# Patient Record
Sex: Female | Born: 1940 | Race: White | Hispanic: No | Smoking: Never smoker
Health system: Southern US, Community
[De-identification: ages and names within clinical notes are randomized; demographics above are authoritative.]

## PROBLEM LIST (undated history)

## (undated) DIAGNOSIS — E669 Obesity, unspecified: Secondary | ICD-10-CM

## (undated) DIAGNOSIS — E785 Hyperlipidemia, unspecified: Secondary | ICD-10-CM

## (undated) DIAGNOSIS — F329 Major depressive disorder, single episode, unspecified: Secondary | ICD-10-CM

## (undated) DIAGNOSIS — H9190 Unspecified hearing loss, unspecified ear: Secondary | ICD-10-CM

## (undated) DIAGNOSIS — F419 Anxiety disorder, unspecified: Secondary | ICD-10-CM

## (undated) DIAGNOSIS — E039 Hypothyroidism, unspecified: Secondary | ICD-10-CM

## (undated) DIAGNOSIS — H43399 Other vitreous opacities, unspecified eye: Secondary | ICD-10-CM

## (undated) DIAGNOSIS — F32A Depression, unspecified: Secondary | ICD-10-CM

## (undated) DIAGNOSIS — I10 Essential (primary) hypertension: Secondary | ICD-10-CM

## (undated) HISTORY — DX: Unspecified hearing loss, unspecified ear: H91.90

## (undated) HISTORY — PX: TONSILLECTOMY: SUR1361

## (undated) HISTORY — DX: Major depressive disorder, single episode, unspecified: F32.9

## (undated) HISTORY — PX: EYE SURGERY: SHX253

## (undated) HISTORY — PX: ANKLE SURGERY: SHX546

## (undated) HISTORY — DX: Essential (primary) hypertension: I10

## (undated) HISTORY — DX: Obesity, unspecified: E66.9

## (undated) HISTORY — DX: Depression, unspecified: F32.A

## (undated) HISTORY — DX: Hyperlipidemia, unspecified: E78.5

## (undated) HISTORY — PX: COLONOSCOPY: SHX174

## (undated) HISTORY — DX: Other vitreous opacities, unspecified eye: H43.399

## (undated) HISTORY — PX: ABDOMINAL HYSTERECTOMY: SHX81

## (undated) HISTORY — DX: Hypothyroidism, unspecified: E03.9

---

## 2015-07-20 ENCOUNTER — Encounter: Payer: Self-pay | Admitting: Sports Medicine

## 2015-07-20 ENCOUNTER — Ambulatory Visit
Admission: RE | Admit: 2015-07-20 | Discharge: 2015-07-20 | Disposition: A | Discharge status: Home / Self Care | Payer: Medicare HMO | Source: Ambulatory Visit | Attending: Sports Medicine | Admitting: Sports Medicine

## 2015-07-20 ENCOUNTER — Ambulatory Visit (INDEPENDENT_AMBULATORY_CARE_PROVIDER_SITE_OTHER): Payer: Medicare HMO | Admitting: Sports Medicine

## 2015-07-20 VITALS — BP 160/80 | Ht 68.0 in | Wt 225.0 lb

## 2015-07-20 DIAGNOSIS — M542 Cervicalgia: Secondary | ICD-10-CM

## 2015-07-20 IMAGING — CR DG CERVICAL SPINE COMPLETE 4+V
7 series · 7 of 7 positions shown · non-contrast
Comparison: None.

CLINICAL DATA: Neck pain.  No recent injury

EXAM:
CERVICAL SPINE - COMPLETE 4+ VIEW

[view not recorded (1 of 7)]
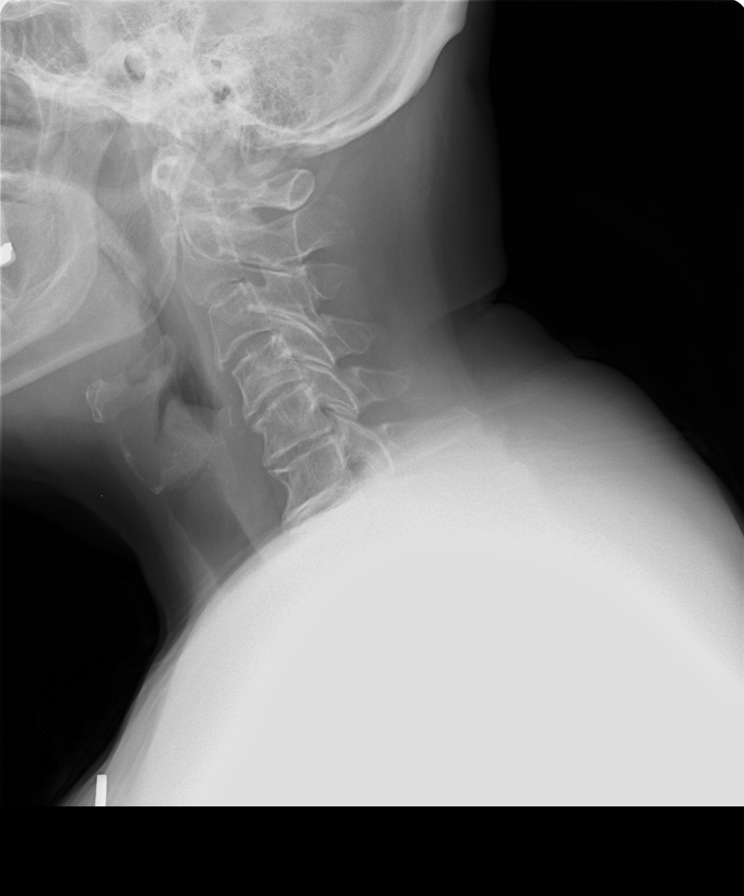

[view not recorded (2 of 7)]
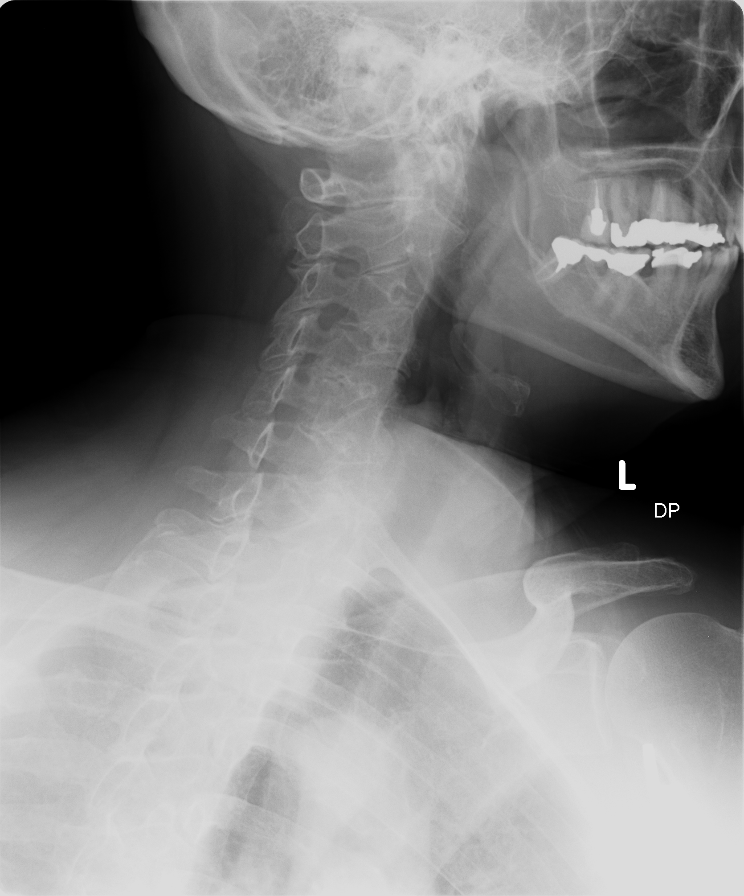

[view not recorded (3 of 7)]
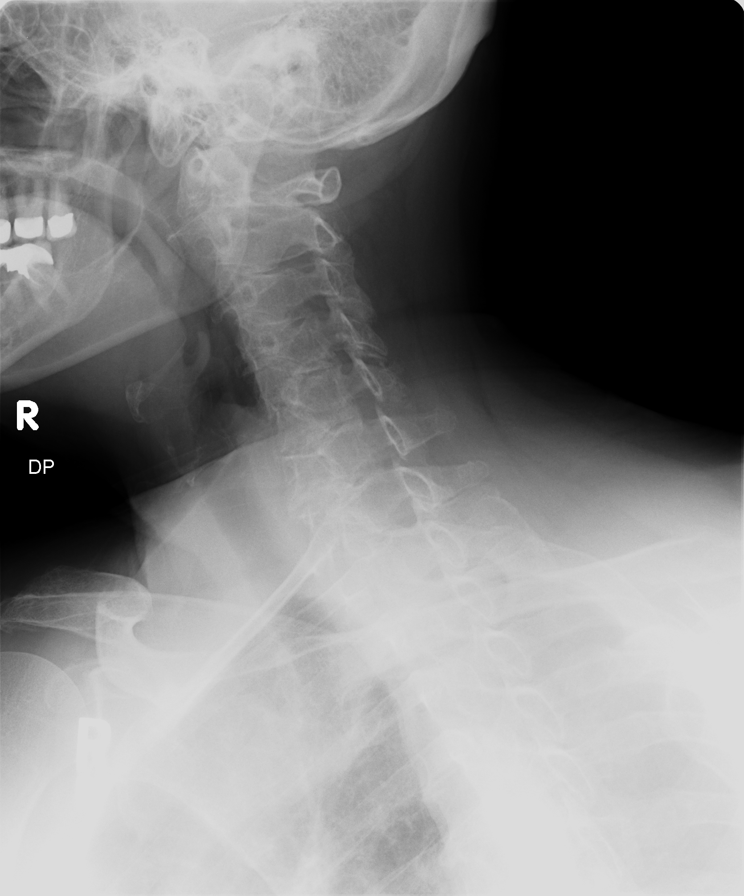

[view not recorded (4 of 7)]
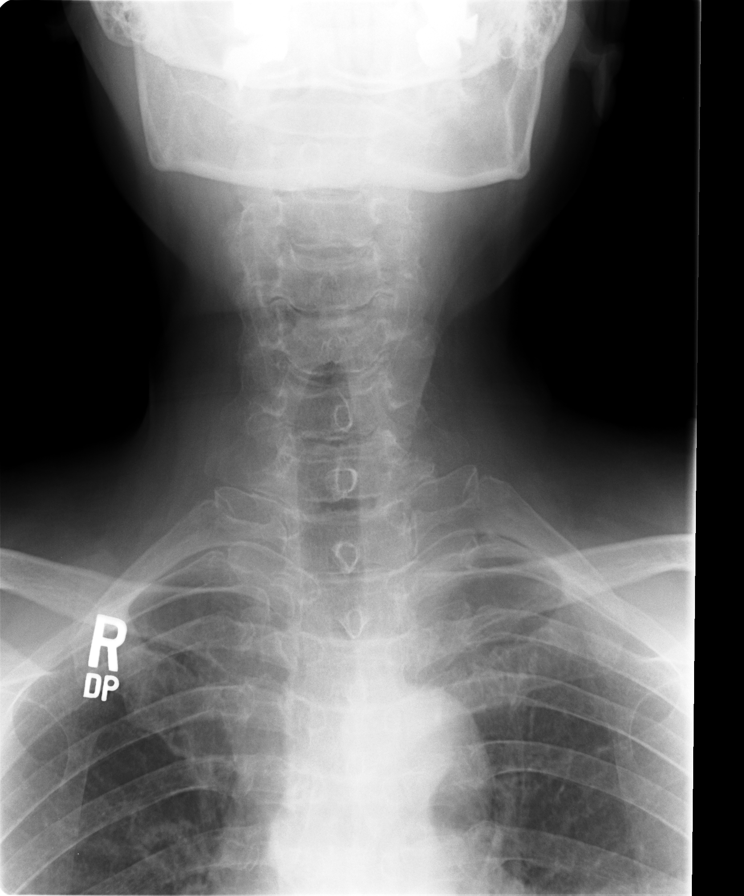

[view not recorded (5 of 7)]
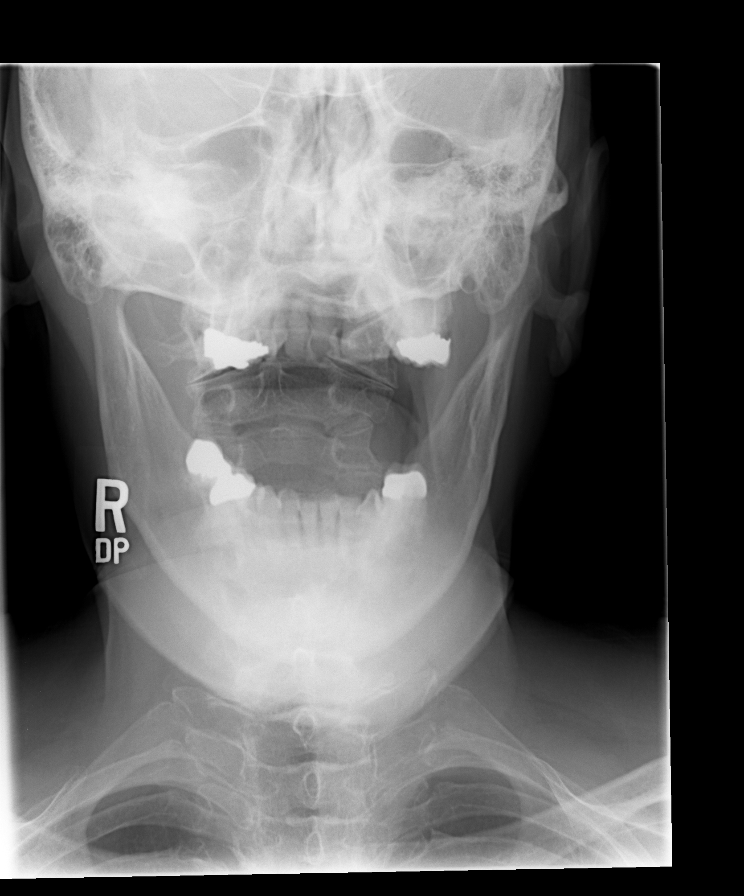

[view not recorded (6 of 7)]
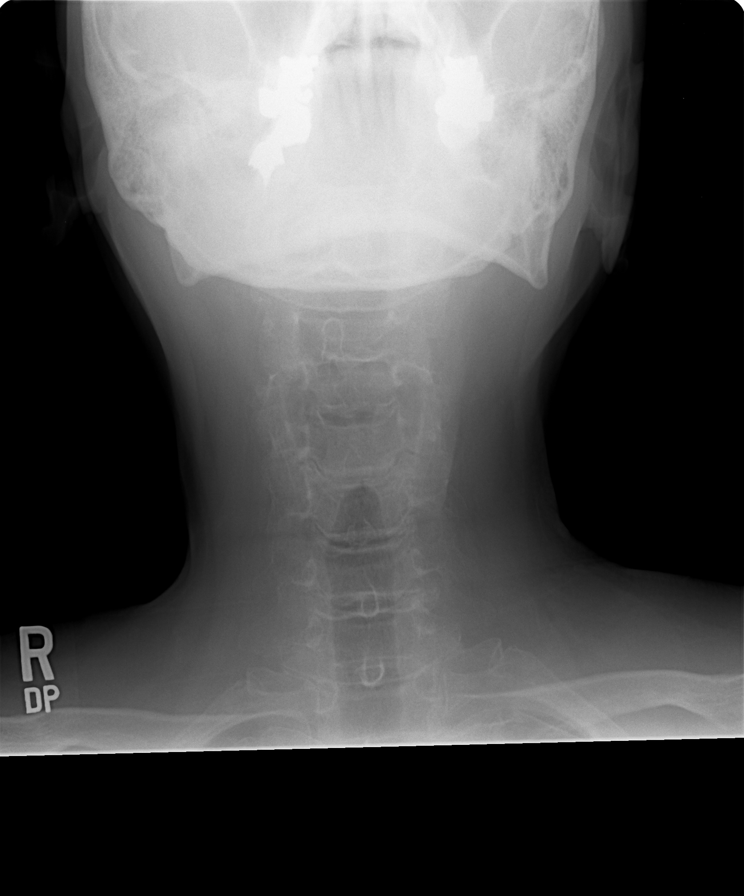

[view not recorded (7 of 7)]
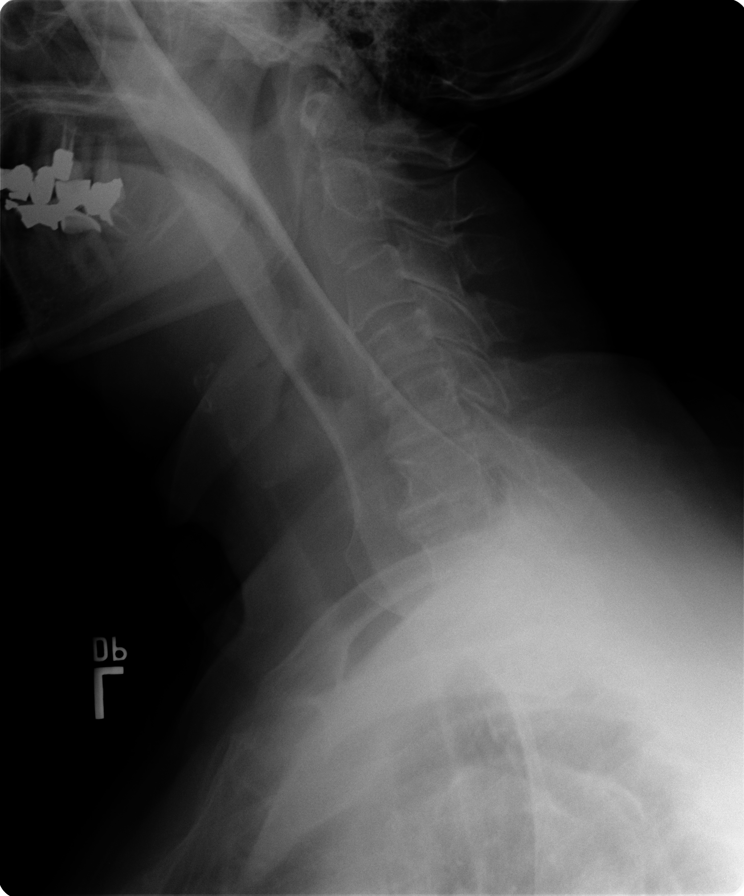

[7 of 7 positions shown; findings below may reference images not displayed]

FINDINGS: Straightening of the cervical lordosis. Normal alignment. Negative
for fracture or mass.

Disc degeneration and spondylosis at C4-5 and C5-6. Mild to moderate
foraminal narrowing bilaterally C4-5 due to uncinate spurring.
Remaining neural foramina are adequately patent.
IMPRESSION: Disc degeneration and spondylosis C4-5 with mild to moderate
foraminal narrowing bilaterally.

No acute bony abnormality.

## 2015-07-20 MED ORDER — TRAMADOL HCL 50 MG PO TABS: 50.0000 mg | ORAL_TABLET | Freq: Four times a day (QID) | ORAL | Status: DC | PRN

## 2015-07-20 MED ORDER — PREDNISONE 10 MG PO TABS: ORAL_TABLET | ORAL | Status: DC

## 2015-07-20 NOTE — Progress Notes (Signed)
  Meghan Diaz - 75 y.o. female MRN [id number]  Date of birth: [birth year]  CC: Left sided neck pain  SUBJECTIVE:   HPI She is Meghan Diaz is a very pleasant 75 year old female with recently poorly controlled hypertension and hypothyroidism who presents with about 2 weeks of acute onset left-sided neck pain. She does not have any history of neck pain previously. Initially she presented to outside facility and was prescribed Flexeril 5 mg did not help her pain whatsoever. She was then seen at a [facility] urgent care 8 days ago and prescribed Voltaren 75 mg twice a day and methocarbamol 500 mg 4 times a day both of which she has been taking scheduled. Unfortunately she has not had a significant improvement in her symptoms. She describes the pain as an intense migrating and throbbing pain of her left trap. There is no pain or other abnormality radiating past the acromion. She denies any weakness in her upper extremities. She denies any trauma or known cause of this discomfort. Currently the pain is severe. She denies any fevers chills or night sweats. She denies any bowel or bladder dysfunction. Notably at the first urgent care her blood pressure was 200/100 but came down to an unknown value prior to being released. She does not believe she has had a x-rays thus far. She does not believe she has any issues with her blood sugar.   ROS:     10 point RoS negative other than that listed in HPI above.   HISTORY: Past Medical, Surgical, Social, and Family History Reviewed & Updated per EMR.  Pertinent Historical Findings include: HTN, recently elevated; Hypothyroid  OBJECTIVE: BP 160/80 mmHg  Ht 5\' 8"  (1.727 m)  Wt 225 lb (102.059 kg)  BMI 34.22 kg/m2  Physical Exam  Long, no acute distress Nonlabored breathing  Neck:  TTP throughout left trap.  Non tender over the spinous processes Negative spurling's Limited Range of motion in all direction, specficially with flexion/extension Grip strength and  sensation normal in bilateral hands Strength good C4 to T1 distribution No sensory change to C4 to T1 Reflexes normal  MEDICATIONS, LABS & OTHER ORDERS: Previous Medications   DICLOFENAC (VOLTAREN) 75 MG EC TABLET       LEVOTHYROXINE (SYNTHROID, LEVOTHROID) 75 MCG TABLET       LOSARTAN-HYDROCHLOROTHIAZIDE (HYZAAR) 50-12.5 MG TABLET       METHOCARBAMOL (ROBAXIN) 500 MG TABLET       SODIUM CHLORIDE (OCEAN) 0.65 % NASAL SPRAY       Modified Medications   No medications on file   New Prescriptions   TRAMADOL (ULTRAM) 50 MG TABLET    Take 1 tablet (50 mg total) by mouth every 6 (six) hours as needed.   Discontinued Medications   No medications on file  No orders of the defined types were placed in this encounter.   ASSESSMENT & PLAN: See problem based charting & AVS for pt instructions.

## 2015-07-20 NOTE — Assessment & Plan Note (Signed)
[name] is a very pleasant 75 year old female with acute onset left neck pain is consistent with axial neck pain. She has no neurologic symptoms. Her pain has been refractory to one week of scheduled methocarbamol and diclofenac. We will discontinue these medications and place her on a prednisone Dosepak, 6 day. Additionally will provide her with Ultram 50 mg q6-8 hours as needed for pain. Additionally we will evaluate the neck with 4 view x-ray of the cervical spine. We plan to see her back in 5 days. Should her pain worsen or she were to develop new symptoms she should contact us or seek other medical attention. Call with any questions in the interim.

## 2015-07-21 ENCOUNTER — Telehealth: Payer: Self-pay | Admitting: Sports Medicine

## 2015-07-21 NOTE — Telephone Encounter (Signed)
Opened by mistake.

## 2015-07-26 ENCOUNTER — Ambulatory Visit (INDEPENDENT_AMBULATORY_CARE_PROVIDER_SITE_OTHER): Payer: Medicare HMO | Admitting: Sports Medicine

## 2015-07-26 ENCOUNTER — Encounter: Payer: Self-pay | Admitting: Sports Medicine

## 2015-07-26 VITALS — BP 150/84 | Ht 68.0 in | Wt 225.0 lb

## 2015-07-26 DIAGNOSIS — M5030 Other cervical disc degeneration, unspecified cervical region: Secondary | ICD-10-CM | POA: Diagnosis not present

## 2015-07-26 DIAGNOSIS — M542 Cervicalgia: Secondary | ICD-10-CM

## 2015-07-26 NOTE — Progress Notes (Signed)
   Subjective:    Patient ID: Meghan Diaz, female    DOB: Feb 04, 1941, 75 y.o.   MRN: [id number]  HPI   Patient comes in today for follow-up on neck pain. Pain has improved with a 6 day Sterapred Dosepak. She has been unable to take tramadol due to side effects. She still has some limited range of motion and some occasional spasms but overall her symptoms have improved since her office visit a few days ago. She has been using a heating pad which she has found to be helpful.    Review of Systems     Objective:   Physical Exam Well-developed, well-nourished. No acute distress. Sitting comfortably in the exam room.  Cervical spine: Patient still has limited cervical range of motion in all planes. Some slight tenderness to palpation along the left trapezius. No gross focal neurological deficit of either upper extremity.  X-rays of her cervical spine dated 07/20/2015 are reviewed. There is disc degeneration and spondylosis at C4-C5 with mild to moderate foraminal narrowing bilaterally. Nothing acute is seen.       Assessment & Plan:  Improving neck pain secondary to C4-C5 degenerative disc disease   Patient will start 600-800 mg of Advil every 8 hours as needed. She may resume her methocarbamol for spasm as well. Continue with her heating pad. I've arranged [facility]. If symptoms persist or worsen I would consider further diagnostic imaging in the form of an MRI. Patient may continue to resume activity as tolerated and follow-up with me as needed.

## 2015-08-03 ENCOUNTER — Encounter: Payer: Self-pay | Admitting: Rehabilitation

## 2015-08-03 ENCOUNTER — Ambulatory Visit: Payer: Medicare HMO | Attending: Sports Medicine | Admitting: Rehabilitation

## 2015-08-03 DIAGNOSIS — M542 Cervicalgia: Secondary | ICD-10-CM | POA: Diagnosis not present

## 2015-08-03 NOTE — Therapy (Signed)
[facility] [place] [address] [place], [place], [address] Phone: [phone]   Fax:  [phone]  Physical Therapy Evaluation  Patient Details  Name: Meghan Diaz MRN: [id number] Date of Birth: 05/15/1941 Referring Provider: draper  Encounter Date: 08/03/2015      PT End of Session - 08/03/15 1007    Visit Number 1   Date for PT Re-Evaluation 10/01/15   PT Start Time 0800   PT Stop Time 0900   PT Time Calculation (min) 60 min      History reviewed. No pertinent past medical history.  History reviewed. No pertinent past surgical history.  There were no vitals filed for this visit.  Visit Diagnosis:  Cervicalgia - Plan: PT plan of care cert/re-cert      Subjective Assessment - 08/03/15 0808    Subjective 75 y/o female referred to PT from ortho for neck pain.   Xrays demonstrates C4-C6 DDD with some unicinate spurring.     She reports the pain began suddenly and insidiously day after Christmas and became progressively more severe.  She went ortho and started prednisone which has helped her.    She initially could not move her neck much at all.   she denies any injury.   She is still limited with neck movement though, and is hvaing difficulty with all ADL.Marland Kitchen    She is an avid exercises and does machine weight training at [employer], but has been unable to do this since neck pain onset.  She is also unable to lie flat without severe pain and has to prop her head up significantly to sleep.    She has no previous h/o neck pain.     Pertinent History Htn, hypothyroidism   Limitations Lifting;Sitting;Other (comment)   Patient Stated Goals be painfree, be able to return to exercise program, sleep   Currently in Pain? Yes   Pain Score 5    Pain Location Neck   Pain Orientation Left   Pain Descriptors / Indicators Burning;Aching   Pain Type Acute pain   Pain Radiating Towards Top of L shoulder   Pain Onset 1 to 4 weeks  ago   Pain Frequency Constant   Aggravating Factors  lifting weights at gym, doing regular housework   Pain Relieving Factors pain meds   Effect of Pain on Daily Activities limited with household ADL due to the pain            [facility] PT Assessment - 08/03/15 0001    Assessment   Medical Diagnosis L neck pain   Referring Provider draper   Onset Date/Surgical Date 07/04/15   Prior Therapy no   Precautions   Precautions None   Restrictions   Weight Bearing Restrictions No   Balance Screen   Has the patient fallen in the past 6 months No   Has the patient had a decrease in activity level because of a fear of falling?  Yes   Is the patient reluctant to leave their home because of a fear of falling?  No   Home [employer]ist information centre manager residence   Prior Function   Level of Independence Independent   AROM   Cervical Flexion 100% no pain   Cervical Extension 10% lots of pain   Cervical - Right Side Bend 20% pain   Cervical - Left Side Bend 20% pain   Cervical - Right Rotation 30% pain end range   Cervical -  Left Rotation 20% more pain   Flexibility   Soft Tissue Assessment /Muscle Length yes   Palpation   Palpation comment + L UT trigger pt.   tender throughout paracervicals   Special Tests    Special Tests Cervical   Spurling's   Findings Positive   Side Left   Distraction Test   Findngs Negative   Comment BLE                   [facility] Adult PT Treatment/Exercise - 08/03/15 0001    Modalities   Modalities Electrical Stimulation;Moist Heat;Traction   Moist Heat Therapy   Number Minutes Moist Heat 15 Minutes   Moist Heat Location Cervical   Electrical Stimulation   Electrical Stimulation Location cervical L   Electrical Stimulation Parameters premod x 2 channel to L neck   Electrical Stimulation Goals Pain   Traction   Type of Traction Cervical   Min (lbs) 10   Hold Time static   Time 10                PT Education - 08/03/15  1006    Education provided Yes   Education Details seated cervical protraction and sidebending stretches holding underside of table and leaning away   Person(s) Educated Patient   Methods Explanation;Demonstration   Comprehension Need further instruction          PT Short Term Goals - 08/03/15 1034    PT SHORT TERM GOAL #1   Title Pt. will demo 50% cervical ROM in all planes   Time 4   Period Weeks   Status New   PT SHORT TERM GOAL #2   Title Pt. will report 2/10 pain cervical spine worst   Time 4   Period Weeks   Status New   PT SHORT TERM GOAL #3   Title Pt. will be independent with HEP   Time 4   Period Weeks   Status New           PT Long Term Goals - 08/03/15 1035    PT LONG TERM GOAL #1   Title Pt. will demonstrate 75% cervical ROM all planes   Time 8   Period Weeks   Status New   PT LONG TERM GOAL #2   Title Pt. will report 0/10 cervical spine pain and be able to return to weight lifting, gym exercise and all ADL with minimal to no pain or discomfort   Time 8   Period Weeks   Status New               Plan - 08/03/15 1008    Clinical Impression Statement Pt. with severe forward head/rounded shoulders.    Severe cervical ROM deficits and stifness.    Palpable cervical musculature tenderness and UT trigger pts.    + compression test on L   Pt will benefit from skilled therapeutic intervention in order to improve on the following deficits Decreased range of motion;Increased muscle spasms;Hypomobility;Postural dysfunction;Pain   Rehab Potential Good   PT Frequency 2x / week   PT Duration 8 weeks   PT Treatment/Interventions Traction;Ultrasound;Electrical Stimulation;Moist Heat;Therapeutic exercise;Manual techniques;Patient/family education   PT Next Visit Plan reassess if traction helped.    do some manual sidebending stretching, UT and suboccipital  MFR and ? light jt mobs, advance HEP         Problem List Patient Active Problem List    Diagnosis Date Noted  . Neck pain on left  side 07/20/2015    Meghan Diaz, PT 08/03/2015, 10:58 AM  [facility] [place] [address] [place], [place], [address] Phone: [phone]   Fax:  [phone]  Name: Meghan Diaz MRN: [id number] Date of Birth: Jul 11, 1940

## 2015-08-05 ENCOUNTER — Encounter: Payer: Self-pay | Admitting: Physical Therapy

## 2015-08-05 ENCOUNTER — Ambulatory Visit: Payer: Medicare HMO | Admitting: Physical Therapy

## 2015-08-05 DIAGNOSIS — M542 Cervicalgia: Secondary | ICD-10-CM | POA: Diagnosis not present

## 2015-08-05 NOTE — Therapy (Signed)
[facility] [place] [address] [place], [place], [address] Phone: [phone]   Fax:  [phone]  Physical Therapy Treatment  Patient Details  Name: Meghan Diaz MRN: [id number] Date of Birth: 12/25/1940 Referring Provider: draper  Encounter Date: 08/05/2015      PT End of Session - 08/05/15 1036    Visit Number 2   Date for PT Re-Evaluation 10/01/15   PT Start Time 1012   PT Stop Time 1100   PT Time Calculation (min) 48 min      History reviewed. No pertinent past medical history.  History reviewed. No pertinent past surgical history.  There were no vitals filed for this visit.  Visit Diagnosis:  Cervicalgia      Subjective Assessment - 08/05/15 1011    Subjective very sore and painful after last session- maybe from trying to stretch at home   Currently in Pain? Yes   Pain Score 5    Pain Location Neck                         [facility] Adult PT Treatment/Exercise - 08/05/15 0001    Exercises   Exercises Neck;Shoulder   Neck Exercises: Machines for Strengthening   UBE (Upper Arm Bike) 2 fwd/2 back   Neck Exercises: Standing   Other Standing Exercises 3# shruggs,backward rolls and scap squeeze 10 reps each   Other Standing Exercises yellow tband ext,retratcion and abd 10 times each   Neck Exercises: Supine   Cervical Isometrics Extension;Right lateral flexion;Left lateral flexion;Right rotation;Left rotation  on ball   Moist Heat Therapy   Number Minutes Moist Heat 15 Minutes   Moist Heat Location Cervical   Electrical Stimulation   Electrical Stimulation Location cervical    Electrical Stimulation Parameters IFC   Electrical Stimulation Goals Pain   Manual Therapy   Manual Therapy Soft tissue mobilization;Passive ROM;Manual Traction   Manual therapy comments very tender and limited tolerance   Passive ROM cerv spine   Manual Traction limited tolerance                PT  Education - 08/05/15 1015    Education provided Yes   Education Details reviewed HEP issued at eval and VCing for correct tech as pt was pulling too hard with UE during lat flexion   Person(s) Educated Patient   Methods Explanation;Verbal cues;Demonstration   Comprehension Verbalized understanding;Returned demonstration          PT Short Term Goals - 08/05/15 1039    PT SHORT TERM GOAL #3   Title Pt. will be independent with HEP   Status Achieved           PT Long Term Goals - 08/03/15 1035    PT LONG TERM GOAL #1   Title Pt. will demonstrate 75% cervical ROM all planes   Time 8   Period Weeks   Status New   PT LONG TERM GOAL #2   Title Pt. will report 0/10 cervical spine pain and be able to return to weight lifting, gym exercise and all ADL with minimal to no pain or discomfort   Time 8   Period Weeks   Status New               Plan - 08/05/15 1037    Clinical Impression Statement fwd head and rounded shld,worked on postural ther ex. Very limited tolerance to MT d/t pain and tenderness.  Corrected HEP and encouraged increased ROM. Difficulty lying d/t LBP   PT Next Visit Plan ROM, MT and postural ther ex        Problem List Patient Active Problem List   Diagnosis Date Noted  . Neck pain on left side 07/20/2015    PAYSEUR,ANGIE PTA 08/05/2015, 10:41 AM  [facility] [place] [address] [place], [place], [address] Phone: [phone]   Fax:  [phone]  Name: KISHIA SHACKETT MRN: [id number] Date of Birth: December 18, 1940

## 2015-08-09 ENCOUNTER — Ambulatory Visit: Payer: Medicare HMO | Admitting: Physical Therapy

## 2015-08-09 ENCOUNTER — Encounter: Payer: Self-pay | Admitting: Physical Therapy

## 2015-08-09 DIAGNOSIS — M542 Cervicalgia: Secondary | ICD-10-CM | POA: Diagnosis not present

## 2015-08-09 NOTE — Therapy (Signed)
[facility] [place] [address] [place], [place], [address] Phone: [phone]   Fax:  [phone]  Physical Therapy Treatment  Patient Details  Name: Meghan Diaz MRN: [id number] Date of Birth: [birth year] Referring Provider: draper  Encounter Date: 08/09/2015      PT End of Session - 08/09/15 0935    Visit Number 3   Date for PT Re-Evaluation 10/01/15   PT Start Time 0847   PT Stop Time 0945   PT Time Calculation (min) 58 min      History reviewed. No pertinent past medical history.  History reviewed. No pertinent past surgical history.  There were no vitals filed for this visit.  Visit Diagnosis:  Cervicalgia      Subjective Assessment - 08/09/15 0849    Subjective aleve every 8 hours,sleeping better. Pt still very quarded with all mvmt.    Currently in Pain? Yes   Pain Score 5    Pain Location Neck                         [facility] Adult PT Treatment/Exercise - 08/09/15 0001    Neck Exercises: Machines for Strengthening   UBE (Upper Arm Bike) 2 fwd/2 back   Neck Exercises: Standing   Other Standing Exercises 4# shruggs,backward rolls and scap squeeze 10 reps each   Other Standing Exercises yellow tband ext,retratcion and abd 10 times each   Neck Exercises: Seated   Neck Retraction 10 reps;3 secs  ball on wall   Modalities   Modalities Ultrasound   Moist Heat Therapy   Number Minutes Moist Heat 15 Minutes   Moist Heat Location Cervical   Electrical Stimulation   Electrical Stimulation Location cerv Left side   Electrical Stimulation Parameters IFC   Electrical Stimulation Goals Pain   Ultrasound   Ultrasound Location left cerv/UT/rhom   Ultrasound Parameters como with estim   Manual Therapy   Manual Therapy Soft tissue mobilization;Passive ROM   Manual therapy comments very tender and limited tolerance                  PT Short Term Goals - 08/05/15 1039    PT SHORT  TERM GOAL #3   Title Pt. will be independent with HEP   Status Achieved           PT Long Term Goals - 08/03/15 1035    PT LONG TERM GOAL #1   Title Pt. will demonstrate 75% cervical ROM all planes   Time 8   Period Weeks   Status New   PT LONG TERM GOAL #2   Title Pt. will report 0/10 cervical spine pain and be able to return to weight lifting, gym exercise and all ADL with minimal to no pain or discomfort   Time 8   Period Weeks   Status New               Plan - 08/09/15 0936    Clinical Impression Statement pt very guarded cerv ROM.very tender with STW and PROM. pt with relief with modalities. Continue wo work to increase ROM   PT Next Visit Plan ROM, MT and postural ther ex        Problem List Patient Active Problem List   Diagnosis Date Noted  . Neck pain on left side 07/20/2015    Cariana Karge,ANGIE PTA 08/09/2015, 9:37 AM  [facility] [address] [place], [place], [address] Phone: [phone]   Fax:  [phone]  Name: Meghan Diaz MRN: [id number] Date of Birth: [birth year]

## 2015-08-12 ENCOUNTER — Ambulatory Visit: Payer: Medicare HMO | Attending: Sports Medicine | Admitting: Physical Therapy

## 2015-08-12 ENCOUNTER — Encounter: Payer: Self-pay | Admitting: Physical Therapy

## 2015-08-12 DIAGNOSIS — M542 Cervicalgia: Secondary | ICD-10-CM

## 2015-08-12 NOTE — Therapy (Signed)
[facility] [place] [address] [place], [place], [address] Phone: [phone]   Fax:  [phone]  Physical Therapy Treatment  Patient Details  Name: Meghan Diaz MRN: [id number] Date of Birth: 1941-04-04 Referring Provider: draper  Encounter Date: 08/12/2015      PT End of Session - 08/12/15 0924    Visit Number 4   Date for PT Re-Evaluation 10/01/15   PT Start Time 0845   PT Stop Time 0943   PT Time Calculation (min) 58 min      History reviewed. No pertinent past medical history.  History reviewed. No pertinent past surgical history.  There were no vitals filed for this visit.  Visit Diagnosis:  Cervicalgia      Subjective Assessment - 08/12/15 0846    Subjective i have been really working on turning-better- i am pleased   Currently in Pain? Yes   Pain Score 3    Pain Location Neck   Pain Orientation Left            [facility] PT Assessment - 08/12/15 0001    AROM   Cervical Flexion 75% limited   Cervical Extension 75% limited   Cervical - Right Side Bend 75% limited   Cervical - Left Side Bend 75% limited   Cervical - Right Rotation 40% limited   Cervical - Left Rotation 40% limited                     [facility] Adult PT Treatment/Exercise - 08/12/15 0001    Neck Exercises: Machines for Strengthening   UBE (Upper Arm Bike) 2 fwd/2 back L2   Other Machines for Strengthening lat pull and seated row 15# 2 sets 10   Neck Exercises: Standing   Neck Retraction 15 reps;3 secs  head on ball on wall   Other Standing Exercises 2# standing ( neck retracted) flex,abd and chest press 2 sets 10   Shoulder Exercises: Pulleys   Other Pulley Exercises 5# 3 way 10 times   Moist Heat Therapy   Number Minutes Moist Heat 15 Minutes   Moist Heat Location Cervical   Electrical Stimulation   Electrical Stimulation Location cerv   Electrical Stimulation Parameters IFC   Electrical Stimulation Goals Pain    Manual Therapy   Manual Therapy Soft tissue mobilization;Passive ROM  Mulligan SNAGS and NAGS to increase cerv ROM   Manual therapy comments --  tender but increased tolerance to MT                  PT Short Term Goals - 08/12/15 0924    PT SHORT TERM GOAL #1   Title Pt. will demo 50% cervical ROM in all planes   Status On-going   PT SHORT TERM GOAL #2   Title Pt. will report 2/10 pain cervical spine worst   Status On-going   PT SHORT TERM GOAL #3   Title Pt. will be independent with HEP   Status On-going           PT Long Term Goals - 08/03/15 1035    PT LONG TERM GOAL #1   Title Pt. will demonstrate 75% cervical ROM all planes   Time 8   Period Weeks   Status New   PT LONG TERM GOAL #2   Title Pt. will report 0/10 cervical spine pain and be able to return to weight lifting, gym exercise and all ADL with minimal to no pain  or discomfort   Time 8   Period Weeks   Status New               Plan - 08/12/15 4098    Clinical Impression Statement ROM improving and increased tolerance to MT and ROM. Still swollen and tender in cerv spine with inflammation noted. Increased tolerance to ther ex   PT Next Visit Plan ROM, MT and postural ther ex        Problem List Patient Active Problem List   Diagnosis Date Noted  . Neck pain on left side 07/20/2015    Emin Foree,ANGIE PTA 08/12/2015, 9:26 AM  [facility] [place] [address] [place], [place], [address] Phone: [phone]   Fax:  [phone]  Name: MALINA GEERS MRN: [id number] Date of Birth: 1940/07/16

## 2015-08-16 ENCOUNTER — Ambulatory Visit: Payer: Medicare HMO | Admitting: Physical Therapy

## 2015-08-16 ENCOUNTER — Encounter: Payer: Self-pay | Admitting: Physical Therapy

## 2015-08-16 DIAGNOSIS — M542 Cervicalgia: Secondary | ICD-10-CM

## 2015-08-16 NOTE — Therapy (Signed)
[facility] [place] [facility] [address] [place], [place], [address] Phone: [phone]   Fax:  [phone]  Physical Therapy Treatment  Patient Details  Name: KHUSHBU PIPPEN MRN: [id number] Date of Birth: 1940-08-17 Referring Provider: draper  Encounter Date: 08/16/2015      PT End of Session - 08/16/15 0926    Visit Number 5   Date for PT Re-Evaluation 10/01/15   PT Start Time 0833   PT Stop Time 0936   PT Time Calculation (min) 63 min   Activity Tolerance Patient tolerated treatment well   Behavior During Therapy [facility] for tasks assessed/performed      History reviewed. No pertinent past medical history.  History reviewed. No pertinent past surgical history.  There were no vitals filed for this visit.  Visit Diagnosis:  Cervicalgia      Subjective Assessment - 08/16/15 0833    Subjective Pt states she was very sore after last visit but felt better the next day.   Currently in Pain? Yes   Pain Score 3    Pain Location Neck   Pain Orientation Left                         [facility] Adult PT Treatment/Exercise - 08/16/15 0001    Neck Exercises: Machines for Strengthening   UBE (Upper Arm Bike) 2 fwd/2 back L2   Other Machines for Strengthening lat pull and seated row 15# 2 sets 10   Neck Exercises: Standing   Neck Retraction 15 reps;3 secs  head on ball on wall   Other Standing Exercises 4# shoulder shruggs 2x15, backward and foward shoulder rolls 4# 2x10, scap squeeze x 10   Shoulder Exercises: Seated   Other Seated Exercises seated bent over row and extension 2x10 3#   Shoulder Exercises: Standing   Other Standing Exercises postural reset against wall, external rotation with red tband with back flat against wall x 15, abduction with back flat against wall x10   Modalities   Modalities Electrical Stimulation;Moist Heat   Moist Heat Therapy   Number Minutes Moist Heat 15 Minutes   Moist Heat Location  Cervical   Electrical Stimulation   Electrical Stimulation Location cervical   Electrical Stimulation Action IFC   Electrical Stimulation Parameters tolerance   Electrical Stimulation Goals Pain   Manual Therapy   Manual Therapy Soft tissue mobilization;Passive ROM  mulligan NAGS and SNAGS                  PT Short Term Goals - 08/12/15 0924    PT SHORT TERM GOAL #1   Title Pt. will demo 50% cervical ROM in all planes   Status On-going   PT SHORT TERM GOAL #2   Title Pt. will report 2/10 pain cervical spine worst   Status On-going   PT SHORT TERM GOAL #3   Title Pt. will be independent with HEP   Status On-going           PT Long Term Goals - 08/03/15 1035    PT LONG TERM GOAL #1   Title Pt. will demonstrate 75% cervical ROM all planes   Time 8   Period Weeks   Status New   PT LONG TERM GOAL #2   Title Pt. will report 0/10 cervical spine pain and be able to return to weight lifting, gym exercise and all ADL with minimal to no pain or discomfort  Time 8   Period Weeks   Status New               Plan - 08/16/15 1610    Clinical Impression Statement Pt tolerated exercises well today. Added some postural exercises that she had some difficulty with due to lack of ROM. Pt still had tightness and tenderness in left cervical spine.   Pt will benefit from skilled therapeutic intervention in order to improve on the following deficits Decreased range of motion;Increased muscle spasms;Hypomobility;Postural dysfunction;Pain   Rehab Potential Good   PT Frequency 2x / week   PT Duration 8 weeks   PT Treatment/Interventions Traction;Ultrasound;Electrical Stimulation;Moist Heat;Therapeutic exercise;Manual techniques;Patient/family education   PT Next Visit Plan ROM, MT, progress postural exercises   Consulted and Agree with Plan of Care Patient        Problem List Patient Active Problem List   Diagnosis Date Noted  . Neck pain on left side 07/20/2015     Vicie Mutters, SPT 08/16/2015, 9:30 AM  [facility] [address] [place], [place], [address] Phone: [phone]   Fax:  [phone]  Name: SANJA ELIZARDO MRN: [id number] Date of Birth: 17-May-1941

## 2015-08-19 ENCOUNTER — Ambulatory Visit: Payer: Medicare HMO | Admitting: Physical Therapy

## 2015-08-19 ENCOUNTER — Encounter: Payer: Self-pay | Admitting: Physical Therapy

## 2015-08-19 DIAGNOSIS — M542 Cervicalgia: Secondary | ICD-10-CM | POA: Diagnosis not present

## 2015-08-19 NOTE — Therapy (Signed)
[facility] [place] [address] [place], [place], [address] Phone: [phone]   Fax:  [phone]  Physical Therapy Treatment  Patient Details  Name: Meghan Diaz MRN: [id number] Date of Birth: [birth year] Referring Provider: draper  Encounter Date: 08/19/2015      PT End of Session - 08/19/15 0938    Visit Number 6   Date for PT Re-Evaluation 10/01/15   PT Start Time 0845   PT Stop Time 0951   PT Time Calculation (min) 66 min   Activity Tolerance Patient tolerated treatment well   Behavior During Therapy [facility] for tasks assessed/performed      History reviewed. No pertinent past medical history.  History reviewed. No pertinent past surgical history.  There were no vitals filed for this visit.  Visit Diagnosis:  Cervicalgia      Subjective Assessment - 08/19/15 0846    Subjective Pt states she has some tenderness in left cervical and upper trap area.    Currently in Pain? Yes   Pain Score 4    Pain Location Neck   Pain Orientation Left            [facility] PT Assessment - 08/19/15 0001    AROM   Cervical Flexion 50% limited   Cervical Extension 75% limited   Cervical - Right Side Bend 75% limited   Cervical - Left Side Bend 75% limited   Cervical - Right Rotation 30% limited   Cervical - Left Rotation 30% limited                     [facility] Adult PT Treatment/Exercise - 08/19/15 0001    Neck Exercises: Machines for Strengthening   UBE (Upper Arm Bike) 3 fwd/3 back L2   Other Machines for Strengthening lat pull and seated row 20# 2 sets 10   Neck Exercises: Standing   Neck Retraction 15 reps;3 secs   Other Standing Exercises 4# shoulder shruggs 2x15, backward and foward shoulder rolls 4# 2x10, scap squeeze x 10   Shoulder Exercises: Seated   Other Seated Exercises seated bent over row and extension 2x10 3#   Shoulder Exercises: Standing   Other Standing Exercises postural reset againat  wall, x to y against wall 2# 2x10, abduction with back against wall 2 x10, ER against wall red tband 2x10   Modalities   Modalities Electrical Stimulation;Moist Heat   Moist Heat Therapy   Number Minutes Moist Heat 15 Minutes   Moist Heat Location Cervical   Electrical Stimulation   Electrical Stimulation Location cervical   Electrical Stimulation Action IFC   Electrical Stimulation Parameters tolerance   Electrical Stimulation Goals Pain   Manual Therapy   Manual Therapy Soft tissue mobilization;Passive ROM                  PT Short Term Goals - 08/12/15 1324    PT SHORT TERM GOAL #1   Title Pt. will demo 50% cervical ROM in all planes   Status On-going   PT SHORT TERM GOAL #2   Title Pt. will report 2/10 pain cervical spine worst   Status On-going   PT SHORT TERM GOAL #3   Title Pt. will be independent with HEP   Status On-going           PT Long Term Goals - 08/03/15 1035    PT LONG TERM GOAL #1   Title Pt. will demonstrate 75% cervical ROM all  planes   Time 8   Period Weeks   Status New   PT LONG TERM GOAL #2   Title Pt. will report 0/10 cervical spine pain and be able to return to weight lifting, gym exercise and all ADL with minimal to no pain or discomfort   Time 8   Period Weeks   Status New               Plan - 08/19/15 1610    Clinical Impression Statement Pt's cervical ROM has improved with extension and side bending remaining difficult due to pain. She was able to tolerate all exercises well today.   Pt will benefit from skilled therapeutic intervention in order to improve on the following deficits Decreased range of motion;Increased muscle spasms;Hypomobility;Postural dysfunction;Pain   Rehab Potential Good   PT Frequency 2x / week   PT Duration 8 weeks   PT Treatment/Interventions Traction;Ultrasound;Electrical Stimulation;Moist Heat;Therapeutic exercise;Manual techniques;Patient/family education   PT Next Visit Plan ROM, MT,  progress postural exercises        Problem List Patient Active Problem List   Diagnosis Date Noted  . Neck pain on left side 07/20/2015    Vicie Mutters, SPT 08/19/2015, 9:41 AM  [facility] [place] [facility] [address] [place], [place], [address] Phone: [phone]   Fax:  [phone]  Name: Meghan Diaz MRN: [id number] Date of Birth: 04-02-1941

## 2015-08-23 ENCOUNTER — Ambulatory Visit: Payer: Medicare HMO | Admitting: Physical Therapy

## 2015-08-23 ENCOUNTER — Encounter: Payer: Self-pay | Admitting: Physical Therapy

## 2015-08-23 DIAGNOSIS — M542 Cervicalgia: Secondary | ICD-10-CM

## 2015-08-23 NOTE — Therapy (Signed)
[facility] [place] [address] [place], [place], [address] Phone: [phone]   Fax:  [phone]  Physical Therapy Treatment  Patient Details  Name: Meghan Diaz MRN: [id number] Date of Birth: 07-20-1940 Referring Provider: draper  Encounter Date: 08/23/2015      PT End of Session - 08/23/15 0924    Visit Number 7   Date for PT Re-Evaluation 10/01/15   PT Start Time 0845   PT Stop Time 0950   PT Time Calculation (min) 65 min      History reviewed. No pertinent past medical history.  History reviewed. No pertinent past surgical history.  There were no vitals filed for this visit.  Visit Diagnosis:  Cervicalgia      Subjective Assessment - 08/23/15 0843    Subjective doing better overall about 75%,still limited ROM and catches when I move wrong.   Currently in Pain? Yes   Pain Score 3    Pain Location Neck   Pain Orientation Left                         [facility] Adult PT Treatment/Exercise - 08/23/15 0001    Neck Exercises: Machines for Strengthening   UBE (Upper Arm Bike) 3 fwd/3 back L3   Other Machines for Strengthening lat pull and seated row 20# 2 sets 15   Other Machines for Strengthening Nustep L 5   Shoulder Exercises: Standing   Other Standing Exercises postural reset againat wall, x to y against wall 2# 2x10, abduction with back against wall 2 x10, ER against wall red tband 2x10   Other Standing Exercises 3# 4 pt rhy stab  pulleys 5# 4 ways 15 times each   Moist Heat Therapy   Number Minutes Moist Heat 15 Minutes   Moist Heat Location Cervical   Electrical Stimulation   Electrical Stimulation Location cervical   Electrical Stimulation Action IFC   Electrical Stimulation Goals Pain   Manual Therapy   Manual Therapy Soft tissue mobilization;Passive ROM;Taping   Kinesiotex Create Space  cspine                  PT Short Term Goals - 08/12/15 0924    PT SHORT  TERM GOAL #1   Title Pt. will demo 50% cervical ROM in all planes   Status On-going   PT SHORT TERM GOAL #2   Title Pt. will report 2/10 pain cervical spine worst   Status On-going   PT SHORT TERM GOAL #3   Title Pt. will be independent with HEP   Status On-going           PT Long Term Goals - 08/03/15 1035    PT LONG TERM GOAL #1   Title Pt. will demonstrate 75% cervical ROM all planes   Time 8   Period Weeks   Status New   PT LONG TERM GOAL #2   Title Pt. will report 0/10 cervical spine pain and be able to return to weight lifting, gym exercise and all ADL with minimal to no pain or discomfort   Time 8   Period Weeks   Status New               Plan - 08/23/15 1324    Clinical Impression Statement increased cerv ROM but needed constant postural cuing with ther ex as pt wants to hang head fwd. Still very tender in cspine but inflammation  decreasing-trial of kinesiotape to create space and for postural cuing   PT Next Visit Plan ROM, MT, progress postural exercises        Problem List Patient Active Problem List   Diagnosis Date Noted  . Neck pain on left side 07/20/2015    Mckinlee Dunk,ANGIE PTA  08/23/2015, 9:26 AM  [facility] [place] [facility] [address] [place], [place], [address] Phone: [phone]   Fax:  [phone]  Name: Meghan Diaz MRN: [id number] Date of Birth: August 16, 1940

## 2015-08-26 ENCOUNTER — Encounter: Payer: Self-pay | Admitting: Physical Therapy

## 2015-08-26 ENCOUNTER — Ambulatory Visit: Payer: Medicare HMO | Admitting: Physical Therapy

## 2015-08-26 DIAGNOSIS — M542 Cervicalgia: Secondary | ICD-10-CM

## 2015-08-26 NOTE — Therapy (Signed)
[facility], [place], [address] Phone: [phone]   Fax:  [phone]  Physical Therapy Treatment  Patient Details  Name: Meghan Diaz MRN: [id number] Date of Birth: May 24, 1941 Referring Provider: draper  Encounter Date: 08/26/2015      PT End of Session - 08/26/15 0926    Visit Number 8   Date for PT Re-Evaluation 10/01/15   PT Start Time 0845   PT Stop Time 0945   PT Time Calculation (min) 60 min      History reviewed. No pertinent past medical history.  History reviewed. No pertinent past surgical history.  There were no vitals filed for this visit.  Visit Diagnosis:  Cervicalgia      Subjective Assessment - 08/26/15 0850    Subjective slight pinch on left, tape helped,overall improving   Currently in Pain? Yes   Pain Score 2    Pain Location Neck   Pain Orientation Left            [facility] PT Assessment - 08/26/15 0001    AROM   Cervical Flexion grossly limited 50% throughout                     [facility] Adult PT Treatment/Exercise - 08/26/15 0001    Neck Exercises: Machines for Strengthening   UBE (Upper Arm Bike) 3 fwd/3 back L3   Other Machines for Strengthening lat pull and seated row 20# 2 sets 15   Shoulder Exercises: Seated   Other Seated Exercises 4# seated  row,shld ext and  abd 2 sets 10   Shoulder Exercises: Pulleys   Other Pulley Exercises 10# 3 way 15 reps   Moist Heat Therapy   Number Minutes Moist Heat 15 Minutes   Moist Heat Location Cervical   Electrical Stimulation   Electrical Stimulation Location cervical   Electrical Stimulation Action IFC   Electrical Stimulation Goals Pain   Manual Therapy   Manual Therapy Soft tissue mobilization;Passive ROM                  PT Short Term Goals - 08/26/15 7989    PT SHORT TERM GOAL #1   Title Pt. will demo 50% cervical ROM in all planes   Status Partially Met   PT SHORT TERM  GOAL #2   Title Pt. will report 2/10 pain cervical spine worst   PT SHORT TERM GOAL #3   Title Pt. will be independent with HEP   Baseline discussed return to gym ex   Status Achieved           PT Long Term Goals - 08/26/15 2119    PT LONG TERM GOAL #1   Title Pt. will demonstrate 75% cervical ROM all planes   Status On-going   PT LONG TERM GOAL #2   Title Pt. will report 0/10 cervical spine pain and be able to return to weight lifting, gym exercise and all ADL with minimal to no pain or discomfort   Status On-going               Plan - 08/26/15 0926    Clinical Impression Statement pt with increase tolerance to ther ex with increased weight and less postural cung needed. Cerv ROM still limited at grossly 50% but after MT improved,"catch" on Left side so decreased RT rot and Rt SB. Progressing with goals    PT Next Visit Plan ROM, MT, progress postural  exercises        Problem List Patient Active Problem List   Diagnosis Date Noted  . Neck pain on left side 07/20/2015    Kimiah Hibner,ANGIE PTA 08/26/2015, 9:29 AM  [place] [address] [place], [place], [address] Phone: [phone]   Fax:  [phone]  Name: Meghan Diaz MRN: [id number] Date of Birth: 05/06/1941

## 2015-08-30 ENCOUNTER — Ambulatory Visit: Payer: Medicare HMO | Admitting: Physical Therapy

## 2015-08-30 ENCOUNTER — Encounter: Payer: Self-pay | Admitting: Physical Therapy

## 2015-08-30 DIAGNOSIS — M542 Cervicalgia: Secondary | ICD-10-CM

## 2015-08-30 NOTE — Therapy (Signed)
[facility] [place] [facility] [address] [place], [place], [address] Phone: [phone]   Fax:  [phone]  Physical Therapy Treatment  Patient Details  Name: Meghan Diaz MRN: [id number] Date of Birth: 1940/11/24 Referring Provider: draper  Encounter Date: 08/30/2015      PT End of Session - 08/30/15 0931    Visit Number 9   Date for PT Re-Evaluation 10/01/15   PT Start Time 0845   PT Stop Time 0942   PT Time Calculation (min) 57 min   Activity Tolerance Patient tolerated treatment well      History reviewed. No pertinent past medical history.  History reviewed. No pertinent past surgical history.  There were no vitals filed for this visit.  Visit Diagnosis:  Cervicalgia      Subjective Assessment - 08/30/15 0845    Subjective "Im not too bad today, about the same it was last week I guess"   Currently in Pain? Yes   Pain Score 3    Pain Location Neck   Pain Orientation Left                         [facility] Adult PT Treatment/Exercise - 08/30/15 0001    Neck Exercises: Machines for Strengthening   UBE (Upper Arm Bike) 3 fwd/3 back L3   Other Machines for Strengthening lat pull and seated row 20# 2 sets 15   Neck Exercises: Standing   Neck Retraction 15 reps;3 secs   Shoulder Exercises: Standing   Flexion 12 reps;Both;Strengthening;Weights   Shoulder Flexion Weight (lbs) 3   Other Standing Exercises abduction with back against wall #2  2 x10, ER against wall red tband 2x10   Other Standing Exercises OHP with blue weighted ball 2x15, shrugs #6 2x15   Moist Heat Therapy   Number Minutes Moist Heat 15 Minutes   Moist Heat Location Cervical   Electrical Stimulation   Electrical Stimulation Location cervical   Electrical Stimulation Action IFC   Electrical Stimulation Goals Pain   Manual Therapy   Manual Therapy Soft tissue mobilization   Soft tissue mobilization L trap                   PT Short Term Goals - 08/26/15 5599    PT SHORT TERM GOAL #1   Title Pt. will demo 50% cervical ROM in all planes   Status Partially Met   PT SHORT TERM GOAL #2   Title Pt. will report 2/10 pain cervical spine worst   PT SHORT TERM GOAL #3   Title Pt. will be independent with HEP   Baseline discussed return to gym ex   Status Achieved           PT Long Term Goals - 08/26/15 7682    PT LONG TERM GOAL #1   Title Pt. will demonstrate 75% cervical ROM all planes   Status On-going   PT LONG TERM GOAL #2   Title Pt. will report 0/10 cervical spine pain and be able to return to weight lifting, gym exercise and all ADL with minimal to no pain or discomfort   Status On-going               Plan - 08/30/15 0931    Clinical Impression Statement Pt demos good carryover with increased exercises. Pt brung in her own TENS unit ad was instructed on proper use, Use estim unit for proper education. Marland Kitchen  No catch on the L side noted. Reports that she felt good after MT to upper L trap.   Pt will benefit from skilled therapeutic intervention in order to improve on the following deficits Decreased range of motion;Increased muscle spasms;Hypomobility;Postural dysfunction;Pain   Rehab Potential Good   PT Frequency 2x / week   PT Duration 8 weeks   PT Treatment/Interventions Traction;Ultrasound;Electrical Stimulation;Moist Heat;Therapeutic exercise;Manual techniques;Patient/family education   PT Next Visit Plan ROM, MT, progress postural exercises        Problem List Patient Active Problem List   Diagnosis Date Noted  . Neck pain on left side 07/20/2015    Scot Jun, PTA  08/30/2015, 9:36 AM  [place] [address] [place], [place], [address] Phone: [phone]   Fax:  [phone]  Name: SIARAH DELEO MRN: [id number] Date of Birth: 1941-06-09

## 2015-08-30 NOTE — Patient Instructions (Signed)
Gave pt detailed instruction of proper use of TENS unit.

## 2015-09-02 ENCOUNTER — Encounter: Payer: Self-pay | Admitting: Physical Therapy

## 2015-09-02 ENCOUNTER — Ambulatory Visit: Payer: Medicare HMO | Admitting: Physical Therapy

## 2015-09-02 DIAGNOSIS — M542 Cervicalgia: Secondary | ICD-10-CM | POA: Diagnosis not present

## 2015-09-02 NOTE — Therapy (Signed)
[place] [place], [place], [address] Phone: [phone]   Fax:  [phone]  Physical Therapy Treatment  Patient Details  Name: Meghan Diaz MRN: [id number] Date of Birth: September 06, 1940 Referring Provider: draper  Encounter Date: 09/02/2015      PT End of Session - 09/02/15 0926    Visit Number 10   Date for PT Re-Evaluation 10/01/15   PT Start Time 0840   PT Stop Time 0942   PT Time Calculation (min) 62 min      History reviewed. No pertinent past medical history.  History reviewed. No pertinent past surgical history.  There were no vitals filed for this visit.  Visit Diagnosis:  Cervicalgia      Subjective Assessment - 09/02/15 0843    Subjective neck is still sore on left side but overall so much better   Currently in Pain? Yes   Pain Score 2    Pain Location Neck   Pain Orientation Left                         [facility] Adult PT Treatment/Exercise - 09/02/15 0001    Neck Exercises: Machines for Strengthening   UBE (Upper Arm Bike) 3 fwd/3 back L3   Other Machines for Strengthening lat pull and seated row 25# 2 sets 15   Shoulder Exercises: Seated   Other Seated Exercises 4# seated  row,shld ext and  abd 2 sets 10   Shoulder Exercises: Standing   Flexion Strengthening;Both;15 reps;Weights   Shoulder Flexion Weight (lbs) 3   ABduction Strengthening;Both;15 reps;Weights  3   Shoulder ABduction Weight (lbs) 3   Other Standing Exercises red tband 3 pt stab on wall   Other Standing Exercises tband stab ex   Moist Heat Therapy   Number Minutes Moist Heat 15 Minutes   Moist Heat Location Cervical   Electrical Stimulation   Electrical Stimulation Location cervical   Electrical Stimulation Action IFC   Electrical Stimulation Goals Pain   Manual Therapy   Manual Therapy Soft tissue mobilization;Taping   Kinesiotex Create Space  Left UT                   PT Short Term Goals - 08/26/15 0928    PT SHORT TERM GOAL #1   Title Pt. will demo 50% cervical ROM in all planes   Status Partially Met   PT SHORT TERM GOAL #2   Title Pt. will report 2/10 pain cervical spine worst   PT SHORT TERM GOAL #3   Title Pt. will be independent with HEP   Baseline discussed return to gym ex   Status Achieved           PT Long Term Goals - 08/26/15 9767    PT LONG TERM GOAL #1   Title Pt. will demonstrate 75% cervical ROM all planes   Status On-going   PT LONG TERM GOAL #2   Title Pt. will report 0/10 cervical spine pain and be able to return to weight lifting, gym exercise and all ADL with minimal to no pain or discomfort   Status On-going               Plan - 09/02/15 0926    Clinical Impression Statement improved functiona nd decreased pain,very tender left UT/rhom so tape to create space. Cerv ROM WFL except RT roation and RT SB limited d/t left cerv tightness  PT Next Visit Plan ROM, MT, progress postural exercises        Problem List Patient Active Problem List   Diagnosis Date Noted  . Neck pain on left side 07/20/2015    Caedyn Raygoza,ANGIE PTA 09/02/2015, 9:30 AM  [place] [address] [place], [place], [address] Phone: [phone]   Fax:  [phone]  Name: CYANA SHOOK MRN: [id number] Date of Birth: 01/11/1941

## 2015-09-06 ENCOUNTER — Encounter: Payer: Self-pay | Admitting: Physical Therapy

## 2015-09-06 ENCOUNTER — Ambulatory Visit: Payer: Medicare HMO | Admitting: Physical Therapy

## 2015-09-06 DIAGNOSIS — M542 Cervicalgia: Secondary | ICD-10-CM

## 2015-09-06 NOTE — Therapy (Signed)
[place], [place], [address] Phone: [phone]   Fax:  [phone]  Physical Therapy Treatment  Patient Details  Name: Meghan Diaz MRN: [id number] Date of Birth: September 03, 1940 Referring Provider: draper  Encounter Date: 09/06/2015      PT End of Session - 09/06/15 0923    Visit Number 11   Date for PT Re-Evaluation 10/01/15   PT Start Time 0846   PT Stop Time 0940   PT Time Calculation (min) 54 min      History reviewed. No pertinent past medical history.  History reviewed. No pertinent past surgical history.  There were no vitals filed for this visit.  Visit Diagnosis:  Cervicalgia      Subjective Assessment - 09/06/15 0849    Subjective left side alittle sore and achey   Currently in Pain? Yes   Pain Score 2    Pain Location Neck   Pain Orientation Left                         [facility] Adult PT Treatment/Exercise - 09/06/15 0001    Neck Exercises: Machines for Strengthening   UBE (Upper Arm Bike) 3 fwd/3 back L3   Other Machines for Strengthening lat pull and seated row 25# 2 sets 15   Neck Exercises: Standing   Neck Retraction 15 reps;3 secs   Other Standing Exercises red tband 3 way 15 times   Shoulder Exercises: Standing   Other Standing Exercises postural reset 3# 15 times  3# chest press and shld flex with cerv retraction on wall   Moist Heat Therapy   Number Minutes Moist Heat 15 Minutes   Moist Heat Location Cervical   Electrical Stimulation   Electrical Stimulation Location cervical   Electrical Stimulation Action IFC   Electrical Stimulation Goals Pain   Manual Therapy   Manual Therapy Soft tissue mobilization   Soft tissue mobilization left cerv/ut and rhomboid                  PT Short Term Goals - 08/26/15 4034    PT SHORT TERM GOAL #1   Title Pt. will demo 50% cervical ROM in all planes   Status Partially Met   PT SHORT  TERM GOAL #2   Title Pt. will report 2/10 pain cervical spine worst   PT SHORT TERM GOAL #3   Title Pt. will be independent with HEP   Baseline discussed return to gym ex   Status Achieved           PT Long Term Goals - 08/26/15 7425    PT LONG TERM GOAL #1   Title Pt. will demonstrate 75% cervical ROM all planes   Status On-going   PT LONG TERM GOAL #2   Title Pt. will report 0/10 cervical spine pain and be able to return to weight lifting, gym exercise and all ADL with minimal to no pain or discomfort   Status On-going               Plan - 09/06/15 9563    Clinical Impression Statement pt is improving with func and ROM however stillv ery tender and limited tolerance to MT in left UT. Improved posture noted but decreased cuing with ther ex.   PT Next Visit Plan DRY NEEDLING trial for left UT/rhom and cerv        Problem List Patient Active Problem  List   Diagnosis Date Noted  . Neck pain on left side 07/20/2015    Brevan Luberto,ANGIE PTA 09/06/2015, 9:26 AM  [facility] [place], [place], [address] Phone: [phone]   Fax:  [phone]  Name: Meghan Diaz MRN: [id number] Date of Birth: May 18, 1941

## 2015-09-14 ENCOUNTER — Ambulatory Visit: Payer: Medicare HMO | Attending: Sports Medicine | Admitting: Physical Therapy

## 2015-09-14 DIAGNOSIS — M542 Cervicalgia: Secondary | ICD-10-CM

## 2015-09-15 NOTE — Therapy (Signed)
[facility] [address]  [place], [place], [address] Phone: [phone]   Fax:  [phone]  Physical Therapy Treatment  Patient Details  Name: Meghan Diaz MRN: [id number] Date of Birth: Mar 30, 1941 Referring Provider: draper  Encounter Date: 09/14/2015      PT End of Session - 09/14/15 1459    Visit Number 12   Number of Visits 16   Date for PT Re-Evaluation 10/01/15   PT Start Time 9379   PT Stop Time 1553   PT Time Calculation (min) 55 min      No past medical history on file.  No past surgical history on file.  There were no vitals filed for this visit.  Visit Diagnosis:  Cervicalgia      Subjective Assessment - 09/14/15 1459    Subjective States neck pain has been 2/10 lately other than intermittent sharp pains with c-spine AROM (notes at times in any plane of motion).  States she has improved a great deal with regard to pain and mobility since beginning PT.   Currently in Pain? Yes   Pain Score 2    Pain Location Neck   Pain Orientation Left            TODAY'S TREATMENT Manual - Dry Needling (see below) TherEx - attempted hooklying on 1/2 FR but did not tolerate Performed standing back to wall B shoulder butterfly with hands behind head with gentle chin tuck 10x5"               Trigger Point Dry Needling - 09/15/15 1647    Consent Given? Yes   Muscles Treated Upper Body Upper trapezius   Upper Trapezius Response --  no response noted, treated B UT and upper c-spine paraspinal                PT Short Term Goals - 08/26/15 0928    PT SHORT TERM GOAL #1   Title Pt. will demo 50% cervical ROM in all planes   Status Partially Met   PT SHORT TERM GOAL #2   Title Pt. will report 2/10 pain cervical spine worst   PT SHORT TERM GOAL #3   Title Pt. will be independent with HEP   Baseline discussed return to gym ex   Status Achieved           PT Long Term  Goals - 08/26/15 0240    PT LONG TERM GOAL #1   Title Pt. will demonstrate 75% cervical ROM all planes   Status On-going   PT LONG TERM GOAL #2   Title Pt. will report 0/10 cervical spine pain and be able to return to weight lifting, gym exercise and all ADL with minimal to no pain or discomfort   Status On-going               Plan - 09/15/15 1644    Clinical Impression Statement pt advised to come to this clinic from [facility] to undergo Dry Needling.  She was treated with DN today but questionable benefit - no twitch response noted and no immediate improvement noted by patient.  She states she'd rather be seen at this clinic going forward so I will be assuming her POC at this time.   PT Treatment/Interventions Traction;Ultrasound;Electrical Stimulation;Moist Heat;Therapeutic exercise;Manual techniques;Patient/family education;Taping;Dry needling   PT Next Visit Plan scapular retraction and t-spine extension exercises; manual and modalities PRN   Consulted and Agree with Plan of  Care Patient        Problem List Patient Active Problem List   Diagnosis Date Noted  . Neck pain on left side 07/20/2015    Meghan Diaz PT, OCS 09/15/2015, 4:48 PM  [facility] [address]  [place], [place], [address] Phone: [phone]   Fax:  [phone]  Name: Meghan Diaz MRN: [id number] Date of Birth: [birth year]

## 2015-09-19 ENCOUNTER — Ambulatory Visit: Payer: Medicare HMO | Admitting: Physical Therapy

## 2015-09-19 DIAGNOSIS — M542 Cervicalgia: Secondary | ICD-10-CM | POA: Diagnosis not present

## 2015-09-19 NOTE — Therapy (Signed)
[facility] [address]  [place], [place], [address] Phone: [phone]   Fax:  [phone]  Physical Therapy Treatment  Patient Details  Name: Meghan Diaz MRN: [id number] Date of Birth: 06-02-1941 Referring Provider: draper  Encounter Date: 09/19/2015      PT End of Session - 09/19/15 1115    Visit Number 13   Number of Visits 16   Date for PT Re-Evaluation 10/01/15   PT Start Time 1114   PT Stop Time 1203   PT Time Calculation (min) 49 min      No past medical history on file.  No past surgical history on file.  There were no vitals filed for this visit.  Visit Diagnosis:  Cervicalgia      Subjective Assessment - 09/19/15 1115    Subjective States thinks needling seemed to help; noted mild muscle soreness day following treatment but states by 3rd day noted decreased pain and hasn't required Advil past couple days.   Currently in Pain? Yes   Pain Score 2    Pain Location Neck         TODAY'S TREATMENT TherEx - Seated B hands on PBall (75cm) FW and diagonal rollouts 5x each for lat stretching/AROM Standing Back to Wall B Shoulder ER with Scap retraction Red TB 12x3" Standing Back to Wall B Shoulder Horiz ABD Red TB 12x3"   Manual - Seated back to pball (55cm) with PT pulling into gentle t-spine extension stretch first with neutral shoulders then with UE in butterfly behind head STM B UT and LS with gentle stretching and MFR to each; TPR R UT and LS; R 1st rib grade 2 mobs         PT Education - 09/19/15 1205    Education provided Yes   Education Details HEP update   Person(s) Educated Patient   Methods Explanation;Demonstration;Handout   Comprehension Returned demonstration;Verbalized understanding          PT Short Term Goals - 08/26/15 0928    PT SHORT TERM GOAL #1   Title Pt. will demo 50% cervical ROM in all planes   Status Partially Met   PT SHORT TERM GOAL #2   Title Pt. will report 2/10 pain cervical spine worst   PT SHORT TERM GOAL #3   Title Pt. will be independent with HEP   Baseline discussed return to gym ex   Status Achieved           PT Long Term Goals - 08/26/15 4034    PT LONG TERM GOAL #1   Title Pt. will demonstrate 75% cervical ROM all planes   Status On-going   PT LONG TERM GOAL #2   Title Pt. will report 0/10 cervical spine pain and be able to return to weight lifting, gym exercise and all ADL with minimal to no pain or discomfort   Status On-going               Plan - 09/19/15 1206    Clinical Impression Statement pt noted benefit following dry needling treatment last week; today's treatment focused on exercise and HEP instruct followed by some manual to assist with pain and tissue relaxation.  She is still quite tight and tender to R medial UT and LS.   PT Next Visit Plan scapular retraction and t-spine extension exercises; manual and modalities PRN to include dry needling   Consulted and Agree with Plan of Care Patient  Problem List Patient Active Problem List   Diagnosis Date Noted  . Neck pain on left side 07/20/2015    Tanique Matney PT, OCS 09/19/2015, 12:09 PM  [facility] [address]  [place], [place], [address] Phone: [phone]   Fax:  [phone]  Name: Meghan Diaz MRN: [id number] Date of Birth: [birth year]

## 2015-09-23 ENCOUNTER — Ambulatory Visit: Payer: Medicare HMO | Admitting: Physical Therapy

## 2015-09-23 DIAGNOSIS — M542 Cervicalgia: Secondary | ICD-10-CM | POA: Diagnosis not present

## 2015-09-23 NOTE — Therapy (Signed)
[facility] [address]  [place], [place], [address] Phone: [phone]   Fax:  [phone]  Physical Therapy Treatment  Patient Details  Name: Meghan Diaz MRN: [id number] Date of Birth: 1941-02-17 Referring Provider: draper  Encounter Date: 09/23/2015      PT End of Session - 09/23/15 0720    Visit Number 14   Number of Visits 16   Date for PT Re-Evaluation 10/01/15   PT Start Time 0718   PT Stop Time 0801   PT Time Calculation (min) 43 min      No past medical history on file.  No past surgical history on file.  There were no vitals filed for this visit.  Visit Diagnosis:  Cervicalgia      Subjective Assessment - 09/23/15 0720    Subjective States vacuumed a couple days ago and has noted onset of LBP since that time.  States neck feels "about the same" today.  Noted initial increase in neck soreness following last needling treatment but then decreased to 2-3/10 currently.  States has performed HEP 1x/day since last treatment.   Currently in Pain? Yes   Pain Score --  2-3/10   Pain Location Neck   Pain Orientation Left         TODAY'S TREATMENT: Manual - Seated back to pball (55cm) with PT pulling into gentle t-spine extension stretch first with neutral shoulders then with UE in butterfly behind head.  TherEx - Low Row 20# 3x10 (VC and TC for upright posture and scapular retraction without elevation) Squat with Back to wall and 6"FR rolling mid to upper t-spine 10x  Manual - STM and TPR L mid t-spine paraspinals and lower scapular mms due to pain noted here; B UT grade 3 MFR (good benefit noted with this today). L 1st rib grade 2 AP mobs.             PT Short Term Goals - 08/26/15 9798    PT SHORT TERM GOAL #1   Title Pt. will demo 50% cervical ROM in all planes   Status Partially Met   PT SHORT TERM GOAL #2   Title Pt. will report 2/10 pain cervical spine worst   PT SHORT  TERM GOAL #3   Title Pt. will be independent with HEP   Baseline discussed return to gym ex   Status Achieved           PT Long Term Goals - 08/26/15 9211    PT LONG TERM GOAL #1   Title Pt. will demonstrate 75% cervical ROM all planes   Status On-going   PT LONG TERM GOAL #2   Title Pt. will report 0/10 cervical spine pain and be able to return to weight lifting, gym exercise and all ADL with minimal to no pain or discomfort   Status On-going               Plan - 09/23/15 0802    Clinical Impression Statement pt frustrated and emotional today due to feeling limited by her pain. No needling today due to emotional status.  Did perform some exercises along with manual and everything went very well with pt stating "feel better leaving than when I walked in"   PT Next Visit Plan scapular retraction and t-spine extension exercises; manual and modalities PRN to include dry needling   Consulted and Agree with Plan of Care Patient        Problem  List Patient Active Problem List   Diagnosis Date Noted  . Neck pain on left side 07/20/2015    Hang Ammon PT, OCS 09/23/2015, 8:05 AM  [facility] [address]  [place], [place], [address] Phone: [phone]   Fax:  [phone]  Name: Meghan Diaz MRN: [id number] Date of Birth: 1940/09/04

## 2015-09-27 ENCOUNTER — Ambulatory Visit: Payer: Medicare HMO | Admitting: Physical Therapy

## 2015-09-27 DIAGNOSIS — M542 Cervicalgia: Secondary | ICD-10-CM | POA: Diagnosis not present

## 2015-09-27 NOTE — Therapy (Signed)
[facility] [address]  [address] [place], [place], [address] Phone: [phone]   Fax:  [phone]  Physical Therapy Treatment  Patient Details  Name: Meghan Diaz MRN: [id number] Date of Birth: 02-11-1941 Referring Provider: draper  Encounter Date: 09/27/2015      PT End of Session - 09/27/15 0718    Visit Number 15   Number of Visits 16   Date for PT Re-Evaluation 10/01/15   PT Start Time 0717   PT Stop Time 0803   PT Time Calculation (min) 46 min      No past medical history on file.  No past surgical history on file.  There were no vitals filed for this visit.  Visit Diagnosis:  Cervicalgia      Subjective Assessment - 09/27/15 0718    Subjective States neck is always sore after her PT sessions, "is tight this AM just like it always is". States overall feels as though neck is improving. States hasn't required any Advil for past week.   Currently in Pain? Yes   Pain Score 1   1.5/10   Pain Location Neck   Pain Orientation Left            [facility] PT Assessment - 09/27/15 0001    AROM   Cervical Extension 28   Cervical - Right Rotation 32   Cervical - Left Rotation 45        TODAY'S TREATMENT: TherEx - UBE lvl 1.0 1'/1' Low Row 20# 12x, 25# 12x (minimal VC and TC required today for upright posture and scapular retraction without elevation) Doorway chest stretch 2x20" Squat with Back to wall and 6"FR rolling mid to upper t-spine 10x Standing Back to Wall with 6" FR along spine B Shoulder Horiz ABD Green TB 15x (towel roll behind head with pt holding in place with gentle chin tuck) Standing Back to Wall B Shoulder ER with Scap retraction Red TB 15x (towel roll behind head with pt holding in place with gentle chin tuck) Seated B hands on PBall (75cm) FW and diagonal rollouts 5x5" each for lat stretching/AROM  Manual - Seated back to pball (55cm) with PT pulling into gentle t-spine extension  stretch first with neutral shoulders then with UE in butterfly behind head. Pt seated with head resting on pillow on raised plinth B 1st rib grade 2 mobs, upper t-spine STM and B UPA grade 2, attempted upright sitting rotation mobs to R with some benefit but very tender C1/2 on L.                       PT Short Term Goals - 08/26/15 5051    PT SHORT TERM GOAL #1   Title Pt. will demo 50% cervical ROM in all planes   Status Partially Met   PT SHORT TERM GOAL #2   Title Pt. will report 2/10 pain cervical spine worst   PT SHORT TERM GOAL #3   Title Pt. will be independent with HEP   Baseline discussed return to gym ex   Status Achieved           PT Long Term Goals - 08/26/15 0712    PT LONG TERM GOAL #1   Title Pt. will demonstrate 75% cervical ROM all planes   Status On-going   PT LONG TERM GOAL #2   Title Pt. will report 0/10 cervical spine pain and be able to return to weight lifting,  gym exercise and all ADL with minimal to no pain or discomfort   Status On-going               Plan - 09/27/15 0740    Clinical Impression Statement pt reports improvement since beginning PT out here but states still feels limited in ability to exercise, still notes difficulty with driving due to neck ROM restrictions, and still notes intermittent catches in c-spine which cause increased pain.  Manual therapy is difficult due to pt having difficulty with lying supine due to LBP so haven't been able to do this lately.  TTP L upper c-spine with palpation and neck R rotation which is her chief limitation.  Addressed some with seated manual.   PT Next Visit Plan scapular retraction and t-spine extension exercises; manual and modalities PRN to include dry needling   Consulted and Agree with Plan of Care Patient        Problem List Patient Active Problem List   Diagnosis Date Noted  . Neck pain on left side 07/20/2015    Zia Kanner PT, OCS 09/27/2015, 8:07 AM  [facility] [address]  [place], [place], [address] Phone: [phone]   Fax:  [phone]  Name: Meghan Diaz MRN: [id number] Date of Birth: Mar 10, 1941

## 2015-09-28 ENCOUNTER — Ambulatory Visit: Payer: Medicare HMO | Admitting: Physical Therapy

## 2015-09-30 ENCOUNTER — Ambulatory Visit: Payer: Medicare HMO | Admitting: Physical Therapy

## 2015-09-30 DIAGNOSIS — M542 Cervicalgia: Secondary | ICD-10-CM

## 2015-09-30 NOTE — Therapy (Signed)
[facility] [address]  [address] [place], [place], [address] Phone: [phone]   Fax:  [phone]  Physical Therapy Treatment  Patient Details  Name: Meghan Diaz MRN: [id number] Date of Birth: 1941-03-14 Referring Provider: draper  Encounter Date: 09/30/2015      PT End of Session - 09/30/15 0720    Visit Number 16   Number of Visits 24   Date for PT Re-Evaluation 10/28/15   PT Start Time 0719   PT Stop Time 0808   PT Time Calculation (min) 49 min      No past medical history on file.  No past surgical history on file.  There were no vitals filed for this visit.  Visit Diagnosis:  Cervicalgia      Subjective Assessment - 09/30/15 0727    Subjective States hasn't returned to weight training / exercise yet due to fear of return of neck pain. She states initial onset of neck pain was so rapid and so severe she is really concerned about this happening again.  Lately she states AVG neck pain has been 2/10 and worst pain 3.5/10. States han't needed medication for at last the past week.  She denies difficulty with sleeping due to pain.  She currently notes intermittent neck pain with driving, washing hair, and other activities that require neck AROM.  She states overall neck is much better and states is approx 75% back to normal.   Currently in Pain? Yes   Aggravating Factors  neck AROM (flexion and extension especially but all planes are uncomfortable)            [facility] PT Assessment - 09/30/15 0001    ROM / Strength   AROM / PROM / Strength Strength   AROM   Cervical Flexion 27  central lower neck pain   Cervical Extension 37  central lower neck pain   Cervical - Right Side Bend 11  R neck pain   Cervical - Left Side Bend 5  R neck pain   Cervical - Right Rotation 35  L neck pain   Cervical - Left Rotation 41  L neck pain   Strength   Overall Strength Comments B Shouler MMT 5/5 grossly without c/o  neck pain          TODAY'S TREATMENT Neck AROM assessment B UE MMT Gym Mechanics and Intensity instruction  TherEx - Low Row 20# 15x Chest Press 10# 15x Narrow Pulldown 25# 15x Fly Machine 25# 15x High Row 20# 15x Tricep Pushdown 20# 15x          PT Short Term Goals - 09/30/15 0741    PT SHORT TERM GOAL #1   Title Pt. will demo 50% cervical ROM in all planes   Status Deferred   PT SHORT TERM GOAL #2   Title Pt. will report 2/10 pain cervical spine worst  worst pain 3.5/10   Status On-going   PT SHORT TERM GOAL #3   Title Pt. will be independent with HEP   Status Achieved           PT Long Term Goals - 09/30/15 0724    PT LONG TERM GOAL #1   Title C-Spine B rotation to 45 dgs or better, B Side-Bending to 15 dgs or better, Extension to 45 dgs or better by 10/28/15   Status Revised   PT LONG TERM GOAL #2   Title Pt. will report is typically pain-free at rest  and with ADLs by 10/28/15   Status Revised   PT LONG TERM GOAL #3   Title pt able to return to participation in regular exercise without restriction by neck pain by 10/28/15   Status New               Plan - 09/30/15 [id number]    Clinical Impression Statement Ms. Meghan Diaz has attended 12 PT treatments to date (first 7 at [facility] location and the last 5 here at [facility]).  She reports feeling approx 75% back to normal with regard to neck pain and function; however, she remains guarded and fearful of increasing her level of activity due to rapid onset and severity of her neck pain initially.  We have reviewed gym-based exercises with regard to mechanics and appropriate intensity and she plans on gradually returning to gym exercise at this point.  She rates neck pain 2/10 on AVG and 3.5/10 at worst lately and states hasn't need pain medication for over one week.  She is sleeping well without restriction by neck pain.  She notes pain with driving and washing hair as well as other activities that  require neck AROM.  AROM assessment today reveals significant restrictions in all planes but improvements noted with rotation and extension.  I would like to extend her POC 4 weeks at 2x/wk.  Plan is she will progress her gym exercises with my guidance and we will work more on neck AROM in clinic with manual and modalities PRN.   Pt will benefit from skilled therapeutic intervention in order to improve on the following deficits Decreased range of motion;Increased muscle spasms;Hypomobility;Postural dysfunction;Pain   Rehab Potential Fair   PT Frequency 2x / week   PT Duration 4 weeks   PT Treatment/Interventions Traction;Ultrasound;Electrical Stimulation;Moist Heat;Therapeutic exercise;Manual techniques;Patient/family education;Taping;Dry needling   PT Next Visit Plan scapular retraction and t-spine extension exercises; manual and modalities PRN to include dry needling; possible return to traction if able to lie supine   Consulted and Agree with Plan of Care Patient        Problem List Patient Active Problem List   Diagnosis Date Noted  . Neck pain on left side 07/20/2015    Erline Siddoway PT, OCS 09/30/2015, 8:22 AM  [facility] [address]  [address] [place], [place], [address] Phone: [phone]   Fax:  [phone]  Name: Meghan Diaz MRN: [id number] Date of Birth: 1940/10/14

## 2015-10-03 ENCOUNTER — Ambulatory Visit: Payer: Medicare HMO | Admitting: Physical Therapy

## 2015-10-03 DIAGNOSIS — M542 Cervicalgia: Secondary | ICD-10-CM

## 2015-10-03 NOTE — Therapy (Signed)
[facility] [address]  [address] [place], [place], [address] Phone: [phone]   Fax:  [phone]  Physical Therapy Treatment  Patient Details  Name: Meghan Diaz MRN: [id number] Date of Birth: Apr 10, 1941 Referring Provider: draper  Encounter Date: 10/03/2015      PT End of Session - 10/03/15 0939    Visit Number 17   Number of Visits 24   Date for PT Re-Evaluation 10/28/15   PT Start Time 0938   PT Stop Time 1015   PT Time Calculation (min) 37 min      No past medical history on file.  No past surgical history on file.  There were no vitals filed for this visit.  Visit Diagnosis:  Cervicalgia      Subjective Assessment - 10/03/15 0939    Subjective States, "no pain, just aggrivation in L lower neck".  States felt great following last treatment but then noted some increased soreness which lasted a few hours rating up to 3/10.   Currently in Pain? Yes   Pain Score 1    Pain Location Neck   Pain Orientation Left          TODAY'S TREATMENT Manual - pt seated forward bent with head resting on arms on table: B 1st rib grade 2 caudal glides; STM B medial UT Dry Needling (verbal informed consent prior to treatment) performed in same posture as above with needling performed to B UT.  No twitch response or even tenderness noted. Kinesiotape - 50% to B UT  TherEx - Low Row 20# 15x, 25# 15x Chest Press 15# 2x15 High Row 25# 15x              PT Short Term Goals - 09/30/15 0741    PT SHORT TERM GOAL #1   Title Pt. will demo 50% cervical ROM in all planes   Status Deferred   PT SHORT TERM GOAL #2   Title Pt. will report 2/10 pain cervical spine worst  worst pain 3.5/10   Status On-going   PT SHORT TERM GOAL #3   Title Pt. will be independent with HEP   Status Achieved           PT Long Term Goals - 09/30/15 0724    PT LONG TERM GOAL #1   Title C-Spine B rotation to 45 dgs or  better, B Side-Bending to 15 dgs or better, Extension to 45 dgs or better by 10/28/15   Status Revised   PT LONG TERM GOAL #2   Title Pt. will report is typically pain-free at rest and with ADLs by 10/28/15   Status Revised   PT LONG TERM GOAL #3   Title pt able to return to participation in regular exercise without restriction by neck pain by 10/28/15   Status New               Plan - 10/03/15 1017    Clinical Impression Statement Want to perform manual with pt supine but can't tolerate supine due to LBP.  Therefore manual in sitting again today.  Pt is looking to join gym sometime this week - I encouraged her to go through with this.   PT Next Visit Plan scapular retraction and t-spine extension exercises; manual and modalities PRN to include dry needling; possible return to traction if able to lie supine   Consulted and Agree with Plan of Care Patient        Problem  List Patient Active Problem List   Diagnosis Date Noted  . Neck pain on left side 07/20/2015    Demonte Dobratz PT, OCS 10/03/2015, 10:19 AM  [facility] [address]  [address] [place], [place], [address] Phone: [phone]   Fax:  [phone]  Name: Meghan Diaz MRN: [id number] Date of Birth: 1940/07/23

## 2015-10-04 ENCOUNTER — Encounter: Payer: Medicare HMO | Admitting: Physical Therapy

## 2015-10-06 ENCOUNTER — Ambulatory Visit: Payer: Medicare HMO | Admitting: Physical Therapy

## 2015-10-06 DIAGNOSIS — M542 Cervicalgia: Secondary | ICD-10-CM | POA: Diagnosis not present

## 2015-10-06 NOTE — Therapy (Signed)
[facility] [address]  [address] [place], [place], [address] Phone: [phone]   Fax:  [phone]  Physical Therapy Treatment  Patient Details  Name: Meghan Diaz MRN: [id number] Date of Birth: Feb 05, 1941 Referring Provider: draper  Encounter Date: 10/06/2015      PT End of Session - 10/06/15 1538    Visit Number 18   Number of Visits 24   Date for PT Re-Evaluation 10/28/15   PT Start Time 1536   PT Stop Time 1616   PT Time Calculation (min) 40 min      No past medical history on file.  No past surgical history on file.  There were no vitals filed for this visit.  Visit Diagnosis:  Cervicalgia      Subjective Assessment - 10/06/15 1537    Subjective states is tender to touch and sore but "it's getting better" regarding L neck pain today, "tired feeling between shoulder blades"   Currently in Pain? Yes   Pain Score --  off and on up to 2/10   Pain Location Neck   Pain Orientation Left          TODAY'S TREATMENT Manual - pt seated forward bent with head resting on arms on table: B 1st rib grade 2 caudal glides; STM B medial UT and into upper t-spine paraspinals  TherEx - minisquats against wall with 6" FR to upper t-spine Low Row 20# 15x, 35# 15x Back to Wall with FR along spine and pillow behind head B ER Red TB 15x High Row 25# 15x Back to Wall with FR along spine and pillow behind head B GH ABD 2# 15x                         PT Short Term Goals - 09/30/15 0741    PT SHORT TERM GOAL #1   Title Pt. will demo 50% cervical ROM in all planes   Status Deferred   PT SHORT TERM GOAL #2   Title Pt. will report 2/10 pain cervical spine worst  worst pain 3.5/10   Status On-going   PT SHORT TERM GOAL #3   Title Pt. will be independent with HEP   Status Achieved           PT Long Term Goals - 09/30/15 0724    PT LONG TERM GOAL #1   Title C-Spine B rotation to 45 dgs  or better, B Side-Bending to 15 dgs or better, Extension to 45 dgs or better by 10/28/15   Status Revised   PT LONG TERM GOAL #2   Title Pt. will report is typically pain-free at rest and with ADLs by 10/28/15   Status Revised   PT LONG TERM GOAL #3   Title pt able to return to participation in regular exercise without restriction by neck pain by 10/28/15   Status New               Plan - 10/06/15 1618    Clinical Impression Statement pt appears to be standing up straighter at c-spine and she states she feels as though she is as well so some progress being made with posture.  She is certainly aware of forward head posture and is working hard to address with exercise as well as behavioral modifications.   PT Next Visit Plan try adding [place] next treatment; scapular retraction and t-spine extension exercises; manual and modalities PRN to include  dry needling; possible return to traction if able to lie supine   Consulted and Agree with Plan of Care Patient        Problem List Patient Active Problem List   Diagnosis Date Noted  . Neck pain on left side 07/20/2015    Kirat Mezquita PT, OCS 10/06/2015, 4:20 PM  [facility] [address]  [address] [place], [place], [address] Phone: [phone]   Fax:  [phone]  Name: Meghan Diaz MRN: [id number] Date of Birth: 23-Sep-1940

## 2015-10-11 ENCOUNTER — Ambulatory Visit: Payer: Medicare HMO | Attending: Sports Medicine | Admitting: Physical Therapy

## 2015-10-11 DIAGNOSIS — M542 Cervicalgia: Secondary | ICD-10-CM

## 2015-10-11 DIAGNOSIS — M436 Torticollis: Secondary | ICD-10-CM | POA: Diagnosis not present

## 2015-10-11 NOTE — Therapy (Signed)
[facility] [place] [address]  [address] [place], [place], [address] Phone: [phone]   Fax:  [phone]  Physical Therapy Treatment  Patient Details  Name: Meghan Diaz MRN: [id number] Date of Birth: April 14, 1941 Referring Provider: draper  Encounter Date: 10/11/2015      PT End of Session - 10/11/15 0719    Visit Number 19   Number of Visits 24   Date for PT Re-Evaluation 10/28/15   PT Start Time 0718   PT Stop Time 0803   PT Time Calculation (min) 45 min      No past medical history on file.  No past surgical history on file.  There were no vitals filed for this visit.  Visit Diagnosis:  Cervicalgia      Subjective Assessment - 10/11/15 0723    Subjective continues to c/o "tenderness" in c-spine, states "about the same as always", feels tighter this AM "maybe due to weather"   Currently in Pain? Yes            [facility] PT Assessment - 10/11/15 0001    AROM   Cervical Flexion 28   Cervical Extension 34   Cervical - Right Side Bend 9   Cervical - Left Side Bend 8   Cervical - Right Rotation 45   Cervical - Left Rotation 49      TODAY'S TREATMENT TherEx - UBE lvl 2.0 90"/90" Low Row 35# 2x15 Back to pball (55cm) PT assisted chest and t-spine extension stretch Minisquats against wall with 6" FR to upper t-spine 10x High Row 25# 15x Back to Wall with FR along spine and pillow behind head B ER Red TB 15x  Manual - pt supine with table slightly elevated (approx 15dg to limit LBP) performed B 1st  Rib mobs grade 2 and 3; c-spine B SBing mobs grade 2 and 3; STM and stretching B UT and LS; Grade 3 c-spine retraction  TherEx -  Hooklying with HOB slightly elevated pullover 5# 10x             PT Short Term Goals - 09/30/15 0741    PT SHORT TERM GOAL #1   Title Pt. will demo 50% cervical ROM in all planes   Status Deferred   PT SHORT TERM GOAL #2   Title Pt. will report 2/10 pain cervical spine  worst  worst pain 3.5/10   Status On-going   PT SHORT TERM GOAL #3   Title Pt. will be independent with HEP   Status Achieved           PT Long Term Goals - 09/30/15 0724    PT LONG TERM GOAL #1   Title C-Spine B rotation to 45 dgs or better, B Side-Bending to 15 dgs or better, Extension to 45 dgs or better by 10/28/15   Status Revised   PT LONG TERM GOAL #2   Title Pt. will report is typically pain-free at rest and with ADLs by 10/28/15   Status Revised   PT LONG TERM GOAL #3   Title pt able to return to participation in regular exercise without restriction by neck pain by 10/28/15   Status New               Plan - 10/11/15 0741    Clinical Impression Statement pt displays improved B c-spine rotation AROM, all other planes basically same as last assessment.  Advised pt she needs to be able to lie supine to  allow me to address neck ROM and lack of progress with manual so this was attempted today.  Rather than fully supine; performed with pt lying on slightly elevated table (approx 15 degrees) and was tolerated without issue.  She reported feeling more relaxed and better mobility following manual (did not re-assess AROM after manual).   PT Next Visit Plan try adding [place] next treatment?; continue with supine manual treatment progression as tolerated; exercises to focus on scapular retraction and t-spine extension; dry needling PRN; possible return to traction if able to lie supine   Consulted and Agree with Plan of Care Patient        Problem List Patient Active Problem List   Diagnosis Date Noted  . Neck pain on left side 07/20/2015    Javione Gunawan PT, OCS 10/11/2015, 12:15 PM  [facility] [address]  [address] [place], [place], [address] Phone: [phone]   Fax:  [phone]  Name: Meghan Diaz MRN: [id number] Date of Birth: 1941-04-03

## 2015-10-14 ENCOUNTER — Ambulatory Visit: Payer: Medicare HMO | Admitting: Physical Therapy

## 2015-10-14 DIAGNOSIS — M542 Cervicalgia: Secondary | ICD-10-CM

## 2015-10-14 NOTE — Therapy (Addendum)
[facility] [place] [facility] [address]  [place], [place], [address] Phone: [phone]   Fax:  [phone]  Physical Therapy Treatment  Patient Details  Name: Meghan Diaz MRN: [id number] Date of Birth: December 09, 1940 Referring Provider: draper  Encounter Date: 10/14/2015      PT End of Session - 10/14/15 0723    Visit Number 20   Number of Visits 24   Date for PT Re-Evaluation 10/28/15   PT Start Time 0720   PT Stop Time 0801   PT Time Calculation (min) 41 min      No past medical history on file.  No past surgical history on file.  There were no vitals filed for this visit.      Subjective Assessment - 10/14/15 0721    Subjective "still about the same, some days its tight some days feels a little better"   Currently in Pain? Yes   Pain Score --  0-2/10 lately.   Pain Location Neck            [facility] PT Assessment - 10/14/15 0001    AROM   Cervical - Right Side Bend 17   Cervical - Left Side Bend 11   Cervical - Right Rotation 43   Cervical - Left Rotation 57        TODAY'S TREATMENT TherEx - UBE lvl 2.5 90"/90" Low Row 35# 15 Narrow Pulldown 35# 15x High Row 25# 15x  Manual - pt supine with table slightly elevated (approx 15dg to limit LBP) performed B 1st Rib mobs grade 2 and 3; c-spine B SBing mobs grade 2 and 3; STM and stretching B UT and LS; Grade 3 c-spine retraction  TherEx -  Hooklying with HOB slightly elevated pullover 5# 15x Hooklying B Shoulder Horiz ABD Green TB 15x Hooklying Shoulder Flexion with contra Extension Green TB 10x each           PT Short Term Goals - 09/30/15 0741    PT SHORT TERM GOAL #1   Title Pt. will demo 50% cervical ROM in all planes   Status Deferred   PT SHORT TERM GOAL #2   Title Pt. will report 2/10 pain cervical spine worst  worst pain 3.5/10   Status On-going   PT SHORT TERM GOAL #3   Title Pt. will be independent with HEP   Status Achieved            PT Long Term Goals - 10/14/15   PT LONG TERM GOAL #1   Title C-Spine B rotation to 45 dgs or better, B Side-Bending to 15 dgs or better, Extension to 45 dgs or better by 10/28/15   Status Partially met (see ROM values above)   PT LONG TERM GOAL #2   Title Pt. will report is typically pain-free at rest and with ADLs by 10/28/15   Status Partially met - much less pain, 0-2/10 lately   PT LONG TERM GOAL #3   Title pt able to return to participation in regular exercise without restriction by neck pain by 10/28/15   Status Partially met - is performing HEP               Plan - 10/14/15 0756    Clinical Impression Statement good improvement with L rotatoin and R side-bending AROM following manual today.  R rotatoin and L side-bending not much different - will address this more directly next treatment to see if this too can improve.  PT Next Visit Plan continue with supine manual treatment progression as tolerated; exercises to focus on scapular retraction and t-spine extension; dry needling PRN; possible [place] and/or to traction if able to lie supine   Consulted and Agree with Plan of Care Patient      Patient will benefit from skilled therapeutic intervention in order to improve the following deficits and impairments:  Decreased range of motion, Increased muscle spasms, Hypomobility, Postural dysfunction, Pain  Visit Diagnosis: Cervicalgia  Neck stiffness       G-Codes - 10/28/15 0755    Functional Assessment Tool Used PT discretion related to pt's subjective report of feeling 75-80% normal, c-spine AROM, pain values, exercise tolerance in clinic   Functional Limitation Other PT primary   Other PT Primary Current and Discharge Status At least 20 percent but less than 40 percent impaired, limited or restricted   Other PT Primary Goal Status At least 20 percent but less than 40 percent impaired, limited or restricted      Problem List Patient Active Problem List    Diagnosis Date Noted  . Neck pain on left side 07/20/2015    Tonimarie Gritz PT, OCS 2015-10-28, 8:15 AM  [facility] [address]  [place], [place], [address] Phone: [phone]   Fax:  [phone]  Name: Meghan Diaz MRN: [id number] Date of Birth: Jan 12, 1941  PHYSICAL THERAPY DISCHARGE SUMMARY  Visits from Start of Care: 20  Current functional level related to goals / functional outcomes: Ms. Meghan Diaz arrived on 10/20/15 and states that she was contacted by [facility] and told that she owes several hundred dollars due.  Turns out she was misinformed regarding her co-payments.  She progressed well and states can no longer afford PT.   Remaining deficits: Mild neck pain and stiffness   Education / Equipment: HEP Plan: Patient agrees to discharge.  Patient goals were partially met. Patient is being discharged due to financial reasons.  ?????        Leonette Most PT, OCS 11/23/2015  8:03 AM

## 2015-10-17 ENCOUNTER — Ambulatory Visit: Payer: Medicare HMO | Admitting: Physical Therapy

## 2015-10-18 ENCOUNTER — Encounter: Payer: Medicare HMO | Admitting: Physical Therapy

## 2015-10-19 ENCOUNTER — Ambulatory Visit: Payer: Medicare HMO | Admitting: Physical Therapy

## 2015-10-20 ENCOUNTER — Ambulatory Visit: Payer: Medicare HMO | Admitting: Physical Therapy

## 2015-10-20 DIAGNOSIS — M542 Cervicalgia: Secondary | ICD-10-CM

## 2015-10-20 NOTE — Therapy (Addendum)
[facility] [address]  [place], [place], [address] Phone: [phone]   Fax:  [phone]  Physical Therapy Treatment  Patient Details  Name: Meghan Diaz MRN: [id number] Date of Birth: 11-08-1940 Referring Provider: draper  Encounter Date: 10/20/2015      PT End of Session - 10/20/15 6010    Visit Number --   Number of Visits --   Date for PT Re-Evaluation --   PT Start Time 0721   PT Stop Time 0735   PT Time Calculation (min) 14 min      No past medical history on file.  No past surgical history on file.  There were no vitals filed for this visit.      Subjective Assessment - 10/20/15 0725    Subjective pt was recently contacted by [facility] and she was told she owes [facility] around $1000.  She has been making $10 co-payments which is what she was advised but now [facility] has advised her that her co-payments are in fact $40.  Today is her 21st treatment.  She states she feels as though she has been making great progress lately but thinks she has to stop PT due to $.  Regarding her neck, she states "I've been good this week, mostly just tight."  "remarkably better"   Currently in Pain? No/denies            [facility] PT Assessment - 10/20/15 0001    AROM   Cervical - Right Side Bend 20   Cervical - Left Side Bend 10   Cervical - Right Rotation 42   Cervical - Left Rotation 52        TODAY'S TREATMENT (unbilled) Assessed c-spine AROM and updated  HEP         PT Education - 10/20/15 0801    Education provided Yes   Education Details d/c HEP   Person(s) Educated Patient   Methods Explanation;Demonstration;Handout   Comprehension Verbalized understanding;Returned demonstration          PT Short Term Goals - 09/30/15 0741    PT SHORT TERM GOAL #1   Title Pt. will demo 50% cervical ROM in all planes   Status Deferred   PT SHORT TERM GOAL #2   Title Pt. will report 2/10 pain cervical spine  worst  worst pain 3.5/10   Status On-going   PT SHORT TERM GOAL #3   Title Pt. will be independent with HEP   Status Achieved           PT Long Term Goals - 09/30/15 0724    PT LONG TERM GOAL #1   Title C-Spine B rotation to 45 dgs or better, B Side-Bending to 15 dgs or better, Extension to 45 dgs or better by 10/28/15   Status Revised   PT LONG TERM GOAL #2   Title Pt. will report is typically pain-free at rest and with ADLs by 10/28/15   Status Revised   PT LONG TERM GOAL #3   Title pt able to return to participation in regular exercise without restriction by neck pain by 10/28/15   Status New               Plan - 10/20/15 0729    Clinical Impression Statement no billing for today's treatment; updated HEP and assessed neck AROM.  Pt was misinformed regarding co-pay and is now being told she owes $30 additional for each treatment (she has attended 34  treatments, today would have been 21).  She is being placed on hold while she performs HEP.   PT Next Visit Plan on hold while performs HEP   Consulted and Agree with Plan of Care Patient      Patient will benefit from skilled therapeutic intervention in order to improve the following deficits and impairments:  Decreased range of motion, Increased muscle spasms, Hypomobility, Postural dysfunction, Pain  Visit Diagnosis: Cervicalgia     Problem List Patient Active Problem List   Diagnosis Date Noted  . Neck pain on left side 07/20/2015    [facility] 10/20/2015, 8:02 AM  [facility] [address]  [place], [place], [address] Phone: [phone]   Fax:  [phone]  Name: Meghan Diaz MRN: [id number] Date of Birth: Aug 30, 1940    PHYSICAL THERAPY DISCHARGE SUMMARY  Visits from Start of Care: 20  Current functional level related to goals / functional outcomes: Good improvement overall with cervical pain. Some decrease in pain and improvement  in ROM/mobilty/ function    Remaining deficits: Intermittent pain and limitations   Education / Equipment: HEP   Plan: Patient agrees to discharge.  Patient goals were partially met. Patient is being discharged due to financial reasons.  ?????    Celyn P. Helene Kelp PT, MPH 01/05/2016 7:42 AM

## 2015-10-25 ENCOUNTER — Ambulatory Visit: Payer: Medicare HMO | Admitting: Physical Therapy

## 2015-10-28 ENCOUNTER — Ambulatory Visit: Payer: Medicare HMO | Admitting: Physical Therapy

## 2015-11-15 ENCOUNTER — Ambulatory Visit: Payer: Medicare HMO | Admitting: Physical Therapy

## 2018-03-11 ENCOUNTER — Encounter (INDEPENDENT_AMBULATORY_CARE_PROVIDER_SITE_OTHER): Payer: Self-pay

## 2018-03-20 ENCOUNTER — Encounter (INDEPENDENT_AMBULATORY_CARE_PROVIDER_SITE_OTHER): Payer: Self-pay | Admitting: Bariatrics

## 2018-03-20 ENCOUNTER — Ambulatory Visit (INDEPENDENT_AMBULATORY_CARE_PROVIDER_SITE_OTHER): Payer: Medicare HMO | Admitting: Bariatrics

## 2018-03-20 VITALS — BP 118/75 | HR 71 | Temp 98.3°F | Ht 67.0 in | Wt 212.0 lb

## 2018-03-20 DIAGNOSIS — Z1331 Encounter for screening for depression: Secondary | ICD-10-CM

## 2018-03-20 DIAGNOSIS — R5383 Other fatigue: Secondary | ICD-10-CM

## 2018-03-20 DIAGNOSIS — E038 Other specified hypothyroidism: Secondary | ICD-10-CM

## 2018-03-20 DIAGNOSIS — E669 Obesity, unspecified: Secondary | ICD-10-CM

## 2018-03-20 DIAGNOSIS — Z6833 Body mass index (BMI) 33.0-33.9, adult: Secondary | ICD-10-CM

## 2018-03-20 DIAGNOSIS — Z0289 Encounter for other administrative examinations: Secondary | ICD-10-CM

## 2018-03-20 DIAGNOSIS — R0602 Shortness of breath: Secondary | ICD-10-CM | POA: Diagnosis not present

## 2018-03-20 DIAGNOSIS — E039 Hypothyroidism, unspecified: Secondary | ICD-10-CM

## 2018-03-20 DIAGNOSIS — I10 Essential (primary) hypertension: Secondary | ICD-10-CM

## 2018-03-21 LAB — COMPREHENSIVE METABOLIC PANEL
ALK PHOS: 75 IU/L (ref 39–117)
ALT: 12 IU/L (ref 0–32)
AST: 18 IU/L (ref 0–40)
Albumin/Globulin Ratio: 1.9 (ref 1.2–2.2)
Albumin: 4.8 g/dL (ref 3.5–4.8)
BUN/Creatinine Ratio: 15 (ref 12–28)
BUN: 12 mg/dL (ref 8–27)
Bilirubin Total: 0.6 mg/dL (ref 0.0–1.2)
CALCIUM: 9.7 mg/dL (ref 8.7–10.3)
CO2: 22 mmol/L (ref 20–29)
Chloride: 94 mmol/L — ABNORMAL LOW (ref 96–106)
Creatinine, Ser: 0.81 mg/dL (ref 0.57–1.00)
GFR calc Af Amer: 83 mL/min/{1.73_m2} (ref >59)
GFR, EST NON AFRICAN AMERICAN: 72 mL/min/{1.73_m2} (ref >59)
GLOBULIN, TOTAL: 2.5 g/dL (ref 1.5–4.5)
GLUCOSE: 86 mg/dL (ref 65–99)
Potassium: 4.2 mmol/L (ref 3.5–5.2)
Sodium: 137 mmol/L (ref 134–144)
Total Protein: 7.3 g/dL (ref 6.0–8.5)

## 2018-03-21 LAB — HEMOGLOBIN A1C
ESTIMATED AVERAGE GLUCOSE: 114 mg/dL
HEMOGLOBIN A1C: 5.6 % (ref 4.8–5.6)

## 2018-03-21 LAB — INSULIN, RANDOM: INSULIN: 10.4 u[IU]/mL (ref 2.6–24.9)

## 2018-03-25 DIAGNOSIS — I10 Essential (primary) hypertension: Secondary | ICD-10-CM | POA: Insufficient documentation

## 2018-03-25 DIAGNOSIS — E039 Hypothyroidism, unspecified: Secondary | ICD-10-CM | POA: Insufficient documentation

## 2018-03-25 NOTE — Progress Notes (Signed)
.  Office: [phone]  /  Fax: [phone]   HPI:   Chief Complaint: OBESITY  Meghan Diaz (MR# [id number]) is a 77 y.o. female who presents on 03/25/2018 for obesity evaluation and treatment. Current BMI is Body mass index is 33.2 kg/m.Marland Kitchen Meghan Diaz has struggled with obesity for years and has been unsuccessful in either losing weight or maintaining long term weight loss. She has increased her portion size and she has cravings for salty, crunchy and sweet foods. She is snacking at night. Meghan Diaz heard about our clinic through a neighbor. Meghan Diaz attended our information session and states she is currently in the action stage of change and ready to dedicate time achieving and maintaining a healthier weight.  Meghan Diaz states her family eats meals together her desired weight loss is 42 lbs she has significant food cravings issues  she snacks frequently in the evenings she is frequently drinking liquids with calories she frequently eats larger portions than normal  she has binge eating behaviors she struggles with emotional eating    Fatigue Meghan Diaz feels her energy is lower than it should be. This has worsened with weight gain and has not worsened recently. Meghan Diaz admits to daytime somnolence and denies waking up still tired. Patient is at risk for obstructive sleep apnea. Patent has a history of symptoms of daytime fatigue, morning headache and hypertension. Patient generally gets 8 or 9 hours of sleep per night, and states they generally have restful sleep. Snoring is not present. Apneic episodes are not present. Epworth Sleepiness Score is 3 Meghan Diaz has a diagnosis of hypothyroidism that is currently controlled.  Dyspnea on exertion Oneita notes increasing shortness of breath when walking up hills and seems to be worsening over time with weight gain. She notes getting out of breath sooner with activity than she used to. This has not gotten worse recently. Meghan Diaz denies  orthopnea.  Hypertension Meghan Diaz is a 77 y.o. female with hypertension. Meghan Diaz denies chest pain , hypotension or lightheadedness. She is working weight loss to help control her blood pressure with the goal of decreasing her risk of heart attack and stroke. Meghan Diaz blood pressure is currently controlled.  Hypothyroidism Meghan Diaz has a diagnosis of hypothyroidism and there are no changes in temperature. She is on levothyroxine daily and her last TSH was within normal limits (checked on Care Everywhere).. She denies hot or cold intolerance or palpitations, but does admit to ongoing fatigue.  Depression Screen Meghan Diaz's Food and Mood (modified PHQ-9) score was  Depression screen PHQ 2/9 03/20/2018  Decreased Interest 3  Down, Depressed, Hopeless 3  PHQ - 2 Score 6  Altered sleeping 0  Tired, decreased energy 1  Change in appetite 3  Feeling bad or failure about yourself  3  Trouble concentrating 0  Moving slowly or fidgety/restless 0  Suicidal thoughts 0  PHQ-9 Score 13  Difficult doing work/chores Not difficult at all    ALLERGIES: No Known Allergies  MEDICATIONS: Current Outpatient Medications on File Prior to Visit  Medication Sig Dispense Refill  . levothyroxine (SYNTHROID, LEVOTHROID) 75 MCG tablet     . losartan-hydrochlorothiazide (HYZAAR) 50-12.5 MG tablet      No current facility-administered medications on file prior to visit.     PAST MEDICAL HISTORY: Past Medical History:  Diagnosis Date  . Decreased hearing   . Depression   . Floaters in visual field   . HTN (hypertension)   . Hyperlipidemia   . Hypothyroidism   . Obesity  PAST SURGICAL HISTORY: Past Surgical History:  Procedure Laterality Date  . ANKLE SURGERY Left     SOCIAL HISTORY: Social History   Tobacco Use  . Smoking status: Never Smoker  . Smokeless tobacco: Never Used  Substance Use Topics  . Alcohol use: Not on file  . Drug use: Not on file    FAMILY  HISTORY: Family History  Problem Relation Age of Onset  . Diabetes Mother   . Hypertension Mother   . Kidney disease Mother   . Heart disease Mother   . Cancer Father     ROS: Review of Systems  Constitutional: Positive for malaise/fatigue.  HENT: Positive for hearing loss.   Eyes:       Floaters  Respiratory: Positive for shortness of breath (on exertion).   Cardiovascular: Negative for chest pain, palpitations and orthopnea.       Negative for hypotension  Neurological:       Negative for lightheadedness  Endo/Heme/Allergies:       Negative for hot or cold intolerance    PHYSICAL EXAM: Blood pressure 118/75, pulse 71, temperature 98.3 F (36.8 C), temperature source Oral, height 5\' 7"  (1.702 m), weight 212 lb (96.2 kg), SpO2 99 %. Body mass index is 33.2 kg/m. Physical Exam  Constitutional: She is oriented to person, place, and time. She appears well-developed and well-nourished.  HENT:  Head: Normocephalic and atraumatic.  Nose: Nose normal.  M[place]anpati = 3  Eyes: EOM are normal. No scleral icterus.  Neck: Normal range of motion. Neck supple. No thyromegaly present.  Cardiovascular: Normal rate and regular rhythm.  Pulmonary/Chest: Effort normal. No respiratory distress.  Abdominal: Soft. There is no tenderness.  + obesity  Musculoskeletal: Normal range of motion.  Range of Motion normal in all 4 extremities  Neurological: She is alert and oriented to person, place, and time. Coordination normal.  Skin: Skin is warm and dry.  Psychiatric: She has a normal mood and affect. Her behavior is normal.  Vitals reviewed.   RECENT LABS AND TESTS: BMET    Component Value Date/Time   NA 137 03/20/2018 1221   K 4.2 03/20/2018 1221   CL 94 (L) 03/20/2018 1221   CO2 22 03/20/2018 1221   GLUCOSE 86 03/20/2018 1221   BUN 12 03/20/2018 1221   CREATININE 0.81 03/20/2018 1221   CALCIUM 9.7 03/20/2018 1221   GFRNONAA 72 03/20/2018 1221   GFRAA 83 03/20/2018 1221   Lab  Results  Component Value Date   HGBA1C 5.6 03/20/2018   Lab Results  Component Value Date   INSULIN 10.4 03/20/2018   CBC No results found for: WBC, RBC, HGB, HCT, PLT, MCV, MCH, MCHC, RDW, LYMPHSABS, MONOABS, EOSABS, BASOSABS Iron/TIBC/Ferritin/ %Sat No results found for: IRON, TIBC, FERRITIN, IRONPCTSAT Lipid Panel  No results found for: CHOL, TRIG, HDL, CHOLHDL, VLDL, LDLCALC, LDLDIRECT Hepatic Function Panel     Component Value Date/Time   PROT 7.3 03/20/2018 1221   ALBUMIN 4.8 03/20/2018 1221   AST 18 03/20/2018 1221   ALT 12 03/20/2018 1221   ALKPHOS 75 03/20/2018 1221   BILITOT 0.6 03/20/2018 1221   No results found for: TSH Vitamin D There are no recent lab results  ECG  shows NSR with a rate of 71 BPM INDIRECT CALORIMETER done today shows a VO2 of 215 and a REE of 1499. Her calculated basal metabolic rate is 1610 thus her basal metabolic rate is worse than expected.    ASSESSMENT AND PLAN: Other fatigue - Plan:  EKG 12-Lead, Hemoglobin A1c, Insulin, random  Shortness of breath on exertion  Essential hypertension - Plan: Comprehensive metabolic panel  Other specified hypothyroidism  Depression screening  Class 1 obesity with serious comorbidity and body mass index (BMI) of 33.0 to 33.9 in adult, unspecified obesity type  PLAN:  Fatigue Meghan Diaz was informed that her fatigue may be related to obesity, depression or many other causes. Labs will be ordered, and in the meanwhile Meghan Diaz has agreed to work on diet, exercise and weight loss to help with fatigue. Proper sleep hygiene was discussed including the need for 7-8 hours of quality sleep each night. A sleep study was not ordered based on symptoms and Epworth score. Indirect Calorimetry results were discussed today. Meghan Diaz will slowly increase her exercise and follow up as directed.  Dyspnea on exertion Meghan Diaz's shortness of breath appears to be obesity related and exercise induced. She has agreed to work on  weight loss and gradually increase exercise to treat her exercise induced shortness of breath. If Meghan Diaz follows our instructions and loses weight without improvement of her shortness of breath, we will plan to refer to pulmonology. We will monitor this condition regularly. Meghan Diaz agrees to this plan. Indirect Calorimetry results were discussed today. Meghan Diaz will slowly increase her exercise and follow up as directed.  Hypertension We discussed sodium restriction, working on healthy weight loss, and a regular exercise program as the means to achieve improved blood pressure control. Meghan Diaz agreed with this plan and agreed to follow up as directed. We will continue to monitor her blood pressure as well as her progress with the above lifestyle modifications. She will continue losartan and HCTZ and will watch for signs of hypotension as she continues her lifestyle modifications. Meghan Diaz will decrease her sodium intake and she will continue to walk.   Hypothyroidism Meghan Diaz was informed of the importance of good thyroid control to help with weight loss efforts. She was also informed that supertheraputic thyroid levels are dangerous and will not improve weight loss results. Meghan Diaz will continue levothyroxine and increase her activity.       Depression Screen Meghan Diaz had a moderately positive depression screening. Depression is commonly associated with obesity and often results in emotional eating behaviors. We will monitor this closely and work on CBT to help improve the non-hunger eating patterns. Referral to Psychology may be required if no improvement is seen as she continues in our clinic.  Obesity Meghan Diaz is currently in the action stage of change and her goal is to continue with weight loss efforts She has agreed to follow the Category 2 plan Meghan Diaz has been instructed to work up to a goal of 150 minutes of combined cardio and strengthening exercise per week or continue walking  for weight loss and  overall health benefits. We discussed the following Behavioral Modification Strategies today: increase H2O intake, no skipping meals, better snacking choices, increasing lean protein intake, decreasing simple carbohydrates  and decreasing sodium intake  Meghan Diaz agrees to decrease her portion size, but eat more frequent meals. She agrees to no more meal skipping and she will decrease her snacks at night.  Meghan Diaz has agreed to follow up with our clinic in 2 weeks. She was informed of the importance of frequent follow up visits to maximize her success with intensive lifestyle modifications for her multiple health conditions. She was informed we would discuss her lab results at her next visit unless there is a critical issue that needs to be addressed sooner. [name] agreed to keep  her next visit at the agreed upon time to discuss these results.    OBESITY BEHAVIORAL INTERVENTION VISIT  Today's visit was # 1   Starting weight: 212 lbs Starting date: 03/20/18 Today's weight : 212 lbs Today's date: 03/20/2018 Total lbs lost to date: 0 At least 15 minutes were spent on discussing the following behavioral intervention visit.   ASK: We discussed the diagnosis of obesity with Meghan Diaz today and Jaquana agreed to give [place] permission to discuss obesity behavioral modification therapy today.  ASSESS: Avis has the diagnosis of obesity and her BMI today is 33.2 Haddy is in the action stage of change   ADVISE: Ashyra was educated on the multiple health risks of obesity as well as the benefit of weight loss to improve her health. She was advised of the need for long term treatment and the importance of lifestyle modifications to improve her current health and to decrease her risk of future health problems.  AGREE: Multiple dietary modification options and treatment options were discussed and  Marylon agreed to follow the recommendations documented in the above note.  ARRANGE: Esthefany was  educated on the importance of frequent visits to treat obesity as outlined per CMS and USPSTF guidelines and agreed to schedule her next follow up appointment today.   Cristi Loron, am acting as Energy manager for [employer]. Manson Passey, DO  I have reviewed the above documentation for accuracy and completeness, and I agree with the above. -Corinna Capra, DO

## 2018-04-02 ENCOUNTER — Encounter (INDEPENDENT_AMBULATORY_CARE_PROVIDER_SITE_OTHER): Payer: Self-pay | Admitting: Bariatrics

## 2018-04-02 ENCOUNTER — Ambulatory Visit (INDEPENDENT_AMBULATORY_CARE_PROVIDER_SITE_OTHER): Payer: Medicare HMO | Admitting: Bariatrics

## 2018-04-02 VITALS — BP 151/81 | HR 62 | Temp 97.7°F | Ht 67.0 in | Wt 215.0 lb

## 2018-04-02 DIAGNOSIS — Z6833 Body mass index (BMI) 33.0-33.9, adult: Secondary | ICD-10-CM

## 2018-04-02 DIAGNOSIS — E669 Obesity, unspecified: Secondary | ICD-10-CM

## 2018-04-02 DIAGNOSIS — E8881 Metabolic syndrome: Secondary | ICD-10-CM

## 2018-04-02 DIAGNOSIS — I10 Essential (primary) hypertension: Secondary | ICD-10-CM | POA: Diagnosis not present

## 2018-04-03 NOTE — Progress Notes (Signed)
Office: [phone]  /  Fax: [phone]   HPI:   Chief Complaint: OBESITY Meghan Diaz is here to discuss her progress with her obesity treatment plan. She is on the  follow the Category 2 plan and is following her eating plan approximately 30 % of the time. She states she is exercising 0 minutes 0 times per week. Meghan Diaz went out of town. She had decrease structure in time and meal planning. She does not like the bread and wants to decrease the amount of meat.   Her weight is 215 lb (97.5 kg) today and has not lost weight since her last visit. She has lost 0 lbs since starting treatment with Meghan Diaz.  Hypertension Meghan Diaz is a 77 y.o. female with hypertension.  Meghan Diaz denies chest pain, hypotension, lightheadedness, or shortness of breath on exertion. She is working weight loss to help control her blood pressure with the goal of decreasing her risk of heart attack and stroke. She is currently taking losartan-hctz. Meghan Diaz blood pressure is slightly elevated she admits she is not sleeping.   Insulin Resistance Meghan Diaz has a diagnosis of insulin resistance based on her elevated fasting insulin level of 10.4 and A1c 5.6. Although Meghan Diaz blood glucose readings are still under good control, insulin resistance puts her at greater risk of metabolic syndrome and diabetes. She is not taking metformin currently and continues to work on diet and exercise to decrease risk of diabetes.   ALLERGIES: No Known Allergies  MEDICATIONS: Current Outpatient Medications on File Prior to Visit  Medication Sig Dispense Refill  . levothyroxine (SYNTHROID, LEVOTHROID) 75 MCG tablet     . losartan-hydrochlorothiazide (HYZAAR) 50-12.5 MG tablet      No current facility-administered medications on file prior to visit.     PAST MEDICAL HISTORY: Past Medical History:  Diagnosis Date  . Decreased hearing   . Depression   . Floaters in visual field   . HTN (hypertension)   . Hyperlipidemia   .  Hypothyroidism   . Obesity     PAST SURGICAL HISTORY: Past Surgical History:  Procedure Laterality Date  . ANKLE SURGERY Left     SOCIAL HISTORY: Social History   Tobacco Use  . Smoking status: Never Smoker  . Smokeless tobacco: Never Used  Substance Use Topics  . Alcohol use: Not on file  . Drug use: Not on file    FAMILY HISTORY: Family History  Problem Relation Age of Onset  . Diabetes Mother   . Hypertension Mother   . Kidney disease Mother   . Heart disease Mother   . Cancer Father     ROS: Review of Systems  Constitutional: Negative for weight loss.  Respiratory: Negative for shortness of breath.   Cardiovascular: Negative for chest pain.       Negative for hypotension  Neurological:       Negative for lightheadedness    PHYSICAL EXAM: Blood pressure (!) 151/81, pulse 62, temperature 97.7 F (36.5 C), temperature source Oral, height 5\' 7"  (1.702 m), weight 215 lb (97.5 kg), SpO2 99 %. Body mass index is 33.67 kg/m. Physical Exam  Constitutional: She is oriented to person, place, and time. She appears well-developed and well-nourished.  HENT:  Head: Normocephalic.  Cardiovascular: Normal rate.  Pulmonary/Chest: Effort normal.  Musculoskeletal: Normal range of motion.  Neurological: She is oriented to person, place, and time.  Skin: Skin is warm and dry.  Psychiatric: She has a normal mood and affect. Her behavior is normal.  Nursing note and vitals reviewed.   RECENT LABS AND TESTS: BMET    Component Value Date/Time   NA 137 03/20/2018 1221   K 4.2 03/20/2018 1221   CL 94 (L) 03/20/2018 1221   CO2 22 03/20/2018 1221   GLUCOSE 86 03/20/2018 1221   BUN 12 03/20/2018 1221   CREATININE 0.81 03/20/2018 1221   CALCIUM 9.7 03/20/2018 1221   GFRNONAA 72 03/20/2018 1221   GFRAA 83 03/20/2018 1221   Lab Results  Component Value Date   HGBA1C 5.6 03/20/2018   Lab Results  Component Value Date   INSULIN 10.4 03/20/2018   CBC No results found  for: WBC, RBC, HGB, HCT, PLT, MCV, MCH, MCHC, RDW, LYMPHSABS, MONOABS, EOSABS, BASOSABS Iron/TIBC/Ferritin/ %Sat No results found for: IRON, TIBC, FERRITIN, IRONPCTSAT Lipid Panel  No results found for: CHOL, TRIG, HDL, CHOLHDL, VLDL, LDLCALC, LDLDIRECT Hepatic Function Panel     Component Value Date/Time   PROT 7.3 03/20/2018 1221   ALBUMIN 4.8 03/20/2018 1221   AST 18 03/20/2018 1221   ALT 12 03/20/2018 1221   ALKPHOS 75 03/20/2018 1221   BILITOT 0.6 03/20/2018 1221   No results found for: TSH  ASSESSMENT AND PLAN: Essential hypertension  Insulin resistance  Class 1 obesity with serious comorbidity and body mass index (BMI) of 33.0 to 33.9 in adult, unspecified obesity type  PLAN: Hypertension We discussed sodium restriction, working on healthy weight loss, and a regular exercise program as the means to achieve improved blood pressure control. Meghan Diaz agreed with this plan and agreed to follow up as directed. We will continue to monitor her blood pressure as well as her progress with the above lifestyle modifications. She will continue her medications as prescribed and will watch for signs of hypotension as she continues her lifestyle modifications.  Insulin Resistance Meghan Diaz will continue to work on weight loss, exercise, and decreasing simple carbohydrates in her diet to help decrease the risk of diabetes. We dicussed metformin including benefits and risks. She was informed that eating too many simple carbohydrates or too many calories at one sitting increases the likelihood of GI side effects. Metformin prescription was not written today at insulin resistance is mild and should resolve with diet change. Meghan Diaz agreed to follow up with Meghan Diaz as directed to monitor her progress.  Obesity Meghan Diaz is currently in the action stage of change. As such, her goal is to continue with weight loss efforts She has agreed to follow the Category 2 plan Meghan Diaz has been instructed to work up to a  goal of 150 minutes of combined cardio and strengthening exercise per week for weight loss and overall health benefits. We discussed the following Behavioral Modification Strategies today: increasing lean protein intake, decreasing simple carbohydrates , decreasing sodium intake, increasing vegetables, decreasing ETOH, decreasing eating out, no skipping meals, keeping healthy foods in the home, and decrease eating out.    Meghan Diaz has agreed to follow up with our clinic in 2 weeks. She was informed of the importance of frequent follow up visits to maximize her success with intensive lifestyle modifications for her multiple health conditions.   OBESITY BEHAVIORAL INTERVENTION VISIT  Today's visit was # 2   Starting weight: 212 lb Starting date: 03/20/18 Today's weight : 215 lb  Today's date: 04/02/18 Total lbs lost to date: 0 At least 15 minutes were spent on discussing the following behavioral intervention visit.   ASK: We discussed the diagnosis of obesity with Meghan Diaz today and Meghan Diaz agreed to  give Meghan Diaz permission to discuss obesity behavioral modification therapy today.  ASSESS: Meghan Diaz has the diagnosis of obesity and her BMI today is 33.67 Meghan Diaz is in the action stage of change   ADVISE: Meghan Diaz was educated on the multiple health risks of obesity as well as the benefit of weight loss to improve her health. She was advised of the need for long term treatment and the importance of lifestyle modifications to improve her current health and to decrease her risk of future health problems.  AGREE: Multiple dietary modification options and treatment options were discussed and  Meghan Diaz agreed to follow the recommendations documented in the above note.  ARRANGE: Meghan Diaz was educated on the importance of frequent visits to treat obesity as outlined per CMS and USPSTF guidelines and agreed to schedule her next follow up appointment today.  Otis Peak, am acting as  transcriptionist for [employer], DO   I have reviewed the above documentation for accuracy and completeness, and I agree with the above. -Corinna Capra, DO

## 2018-04-16 ENCOUNTER — Ambulatory Visit (INDEPENDENT_AMBULATORY_CARE_PROVIDER_SITE_OTHER): Payer: Medicare HMO | Admitting: Bariatrics

## 2018-04-16 VITALS — BP 159/82 | HR 61 | Temp 97.5°F | Ht 67.0 in | Wt 213.0 lb

## 2018-04-16 DIAGNOSIS — I10 Essential (primary) hypertension: Secondary | ICD-10-CM | POA: Diagnosis not present

## 2018-04-16 DIAGNOSIS — E669 Obesity, unspecified: Secondary | ICD-10-CM | POA: Diagnosis not present

## 2018-04-16 DIAGNOSIS — F3289 Other specified depressive episodes: Secondary | ICD-10-CM | POA: Diagnosis not present

## 2018-04-16 DIAGNOSIS — Z6833 Body mass index (BMI) 33.0-33.9, adult: Secondary | ICD-10-CM | POA: Diagnosis not present

## 2018-04-16 DIAGNOSIS — E8881 Metabolic syndrome: Secondary | ICD-10-CM

## 2018-04-17 DIAGNOSIS — Z6833 Body mass index (BMI) 33.0-33.9, adult: Secondary | ICD-10-CM | POA: Insufficient documentation

## 2018-04-17 DIAGNOSIS — E669 Obesity, unspecified: Secondary | ICD-10-CM | POA: Insufficient documentation

## 2018-04-17 NOTE — Progress Notes (Signed)
Office: [phone]  /  Fax: [phone]   HPI:   Chief Complaint: OBESITY Meghan Diaz is here to discuss her progress with her obesity treatment plan. She is on the Category 2 plan and is following her eating plan approximately 50 % of the time. She states she is exercising 30 minutes 5 times per week. Meghan Diaz is currently struggling and finding it "difficult to commit". She is eating more at night. She is angry with herself (tearful). She has appropriate amount of hunger and is not hungry.  Her weight is 213 lb (96.6 kg) today and has had a weight loss of 2 pounds in 2 weeks since her last visit. She has lost 0 lbs since starting treatment with [place].  Hypertension Meghan Diaz is a 77 y.o. female with hypertension. She is on Losartan/HCTZ 50-12.5mg . She is working on weight loss to help control her blood pressure with the goal of decreasing her risk of heart attack and stroke. Meghan Diaz's blood pressure is not currently well controlled. Meghan Diaz denies hypotension.  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 Although Meghan Diaz's blood glucose readings are still under good control, insulin resistance puts her at greater risk of metabolic syndrome and diabetes. Her last Insulin was 10.4 and her A1c was 5.4 on 03/20/18. She denies polyphagia.   ALLERGIES: No Known Allergies  MEDICATIONS: Current Outpatient Medications on File Prior to Visit  Medication Sig Dispense Refill  . levothyroxine (SYNTHROID, LEVOTHROID) 75 MCG tablet     . losartan-hydrochlorothiazide (HYZAAR) 50-12.5 MG tablet      No current facility-administered medications on file prior to visit.     PAST MEDICAL HISTORY: Past Medical History:  Diagnosis Date  . Decreased hearing   . Depression   . Floaters in visual field   . HTN (hypertension)   . Hyperlipidemia   . Hypothyroidism   . Obesity     PAST SURGICAL HISTORY: Past Surgical History:    Procedure Laterality Date  . ANKLE SURGERY Left     SOCIAL HISTORY: Social History   Tobacco Use  . Smoking status: Never Smoker  . Smokeless tobacco: Never Used  Substance Use Topics  . Alcohol use: Not on file  . Drug use: Not on file    FAMILY HISTORY: Family History  Problem Relation Age of Onset  . Diabetes Mother   . Hypertension Mother   . Kidney disease Mother   . Heart disease Mother   . Cancer Father     ROS: Review of Systems  Constitutional: Negative for weight loss.  Cardiovascular:       Negative for hypotension.  Endo/Heme/Allergies:       Negative for polyphagia.  Psychiatric/Behavioral: Positive for depression. Negative for suicidal ideas.       Negative for homicidal ideations.    PHYSICAL EXAM: Blood pressure (!) 159/82, pulse 61, temperature (!) 97.5 F (36.4 C), temperature source Oral, height 5\' 7"  (1.702 m), weight 213 lb (96.6 kg), SpO2 99 %. Body mass index is 33.36 kg/m. Physical Exam  Constitutional: She is oriented to person, place, and time. She appears well-developed and well-nourished.  Cardiovascular: Normal rate.  Pulmonary/Chest: Effort normal.  Musculoskeletal: Normal range of motion.  Neurological: She is oriented to person, place, and time.  Skin: Skin is warm and dry.  Psychiatric: She has a normal mood and affect. Her behavior is normal.  Vitals reviewed.   RECENT LABS AND TESTS: BMET  Component Value Date/Time   NA 137 03/20/2018 1221   K 4.2 03/20/2018 1221   CL 94 (L) 03/20/2018 1221   CO2 22 03/20/2018 1221   GLUCOSE 86 03/20/2018 1221   BUN 12 03/20/2018 1221   CREATININE 0.81 03/20/2018 1221   CALCIUM 9.7 03/20/2018 1221   GFRNONAA 72 03/20/2018 1221   GFRAA 83 03/20/2018 1221   Lab Results  Component Value Date   HGBA1C 5.6 03/20/2018   Lab Results  Component Value Date   INSULIN 10.4 03/20/2018   CBC No results found for: WBC, RBC, HGB, HCT, PLT, MCV, MCH, MCHC, RDW, LYMPHSABS, MONOABS,  EOSABS, BASOSABS Iron/TIBC/Ferritin/ %Sat No results found for: IRON, TIBC, FERRITIN, IRONPCTSAT Lipid Panel  No results found for: CHOL, TRIG, HDL, CHOLHDL, VLDL, LDLCALC, LDLDIRECT Hepatic Function Panel     Component Value Date/Time   PROT 7.3 03/20/2018 1221   ALBUMIN 4.8 03/20/2018 1221   AST 18 03/20/2018 1221   ALT 12 03/20/2018 1221   ALKPHOS 75 03/20/2018 1221   BILITOT 0.6 03/20/2018 1221   No results found for: TSH  ASSESSMENT AND PLAN: Essential hypertension  Insulin resistance  Other depression - with emotional eating  Class 1 obesity with serious comorbidity and body mass index (BMI) of 33.0 to 33.9 in adult, unspecified obesity type  PLAN:  Hypertension We discussed sodium restriction, working on healthy weight loss, and a regular exercise program as the means to achieve improved blood pressure control. Meghan Diaz agreed with this plan and agreed to follow up as directed. We will continue to monitor her blood pressure as well as her progress with the above lifestyle modifications. She will continue her antihypertensives as prescribed and decrease sodium in his diet. She will watch for signs of hypotension as she continues her lifestyle modifications.  Insulin Resistance Meghan Diaz will continue to work on weight loss, exercise, and decreasing simple carbohydrates in her diet to help decrease the risk of diabetes.She was informed that eating too many simple carbohydrates or too many calories at one sitting increases the likelihood of GI side effects. Meghan Diaz agreed to decrease carbohydrates and follow up with [place] as directed to monitor her progress.  Obesity Meghan Diaz is currently in the action stage of change. As such, her goal is to continue with weight loss efforts. She has agreed to follow the Category 2 plan. We discussed cognitive behavior therapies for emotional eating. Meghan Diaz has been instructed to work up to a goal of 150 minutes of combined cardio and strengthening  exercise per week for weight loss and overall health benefits. We discussed the following Behavioral Modification Strategies today: increasing lean protein intake, decreasing simple carbohydrates, increasing vegetables, increase H2O intake, ways to avoid boredom eating, ways to avoid night time snacking, better snacking choices, emotional eating strategies.   Meghan Diaz has agreed to follow up with our clinic in 2 weeks. She was informed of the importance of frequent follow up visits to maximize her success with intensive lifestyle modifications for her multiple health conditions.   OBESITY BEHAVIORAL INTERVENTION VISIT  Today's visit was # 3   Starting weight: 212 lbs Starting date: 03/20/18 Today's weight : Weight: 213 lb (96.6 kg)  Today's date: 04/16/2018 Total lbs lost to date: 0 At least 15 minutes were spent on discussing the following behavioral intervention visit.  ASK: We discussed the diagnosis of obesity with Meghan Diaz today and Meghan Diaz agreed to give [place] permission to discuss obesity behavioral modification therapy today.  ASSESS: Meghan Diaz has the diagnosis  of obesity and her BMI today is 33.35. Meghan Diaz is in the action stage of change.   ADVISE: Meghan Diaz was educated on the multiple health risks of obesity as well as the benefit of weight loss to improve her health. She was advised of the need for long term treatment and the importance of lifestyle modifications to improve her current health and to decrease her risk of future health problems.  AGREE: Multiple dietary modification options and treatment options were discussed and Meghan Diaz agreed to follow the recommendations documented in the above note.  ARRANGE: Meghan Diaz was educated on the importance of frequent visits to treat obesity as outlined per CMS and USPSTF guidelines and agreed to schedule her next follow up appointment today.  I, Kirke Corin, am acting as Energy manager for [employer]. Manson Passey, DO  I have reviewed the  above documentation for accuracy and completeness, and I agree with the above. -Meghan Capra, DO

## 2018-04-22 ENCOUNTER — Ambulatory Visit (INDEPENDENT_AMBULATORY_CARE_PROVIDER_SITE_OTHER): Payer: Medicare HMO | Admitting: Psychology

## 2018-04-22 DIAGNOSIS — F3289 Other specified depressive episodes: Secondary | ICD-10-CM

## 2018-04-22 NOTE — Progress Notes (Signed)
Office: [phone]  /  Fax: [phone] Date: April 22, 2018 Time Seen: 2:00pm Duration: 60 minutes Provider: Glennie Isle, PsyD Type of Session: Intake for Individual Therapy   Informed Consent: The provider's role was explained to Meghan Diaz. The provider reviewed and discussed issues of confidentiality, privacy, and limits therein. In addition to verbal informed consent, written informed consent for psychological services was obtained from [facility] prior to the initial intake interview. Written consent included information concerning the practice, financial arrangements, and confidentiality and patients' rights. Since the clinic is not a 24/7 crisis center, mental health emergency resources were shared and a handout was provided. The provider further explained the utilization of MyChart, e-mail, voicemail, and/or other messaging systems can be utilized for non-emergency reasons. Meghan Diaz verbally acknowledged understanding of the aforementioned, and agreed to use mental health emergency resources discussed if needed. Moreover, Meghan Diaz agreed information may be shared with other [facility] providers as needed for coordination of care, and written consent was obtained.   Chief Complaint: Meghan Diaz was referred by Dr. Jearld Lesch. Per the note for the last visit with Dr. Jearld Lesch on April 16, 2018, "Meghan Diaz is currently struggling and finding it "difficult to commit". She is eating more at night. She is angry with herself (tearful). She has appropriate amount of hunger and is not hungry." Regarding the prescribed meal plan, Meghan Diaz reported, "I haven't done it." She explained, "I just can't get it in my head to get on this program."   During today's appointment, Meghan Diaz reported, "I am just disgusted when I look at myself in the mirror." She described decreased self-esteem and noted, "It's like I don't love me like I love everyone else." Meghan Diaz reported her last episode of  emotional eating was last night. She explained she went out to eat with her neighbors at [employer]. She noted she consumed pecan pancakes. Later in the evening, she consumed popcorn, beans, and toast. Meghan Diaz indicated, "I was just eating." In addition, Meghan Diaz noted an increase in going out to eat recently. Moreover, she added, "I can usually control it during the day. It is usually the evening."   Meghan Diaz was asked to complete a questionnaire assessing various behaviors related to emotional eating. Meghan Diaz endorsed the following: overeat when you are celebrating, experience food cravings on a regular basis, eat certain foods when you are anxious, stressed, depressed, or your feelings are hurt, use food to help you cope with emotional situations, find food is comforting to you, overeat when you are angry or upset, overeat when you are worried about something, overeat frequently when you are bored or lonely, overeat when you are alone, but eat much less when you are with other people and eat to help you stay awake. She further added eating as a reward.   HPI: Per the note for the initial visit with Dr. Jearld Lesch on March 20, 2018, "Zia has struggled with obesity for years and has been unsuccessful in either losing weight or maintaining long term weight loss. She has increased her portion size and she has cravings for salty, crunchy and sweet foods. She is snacking at night."  During the initial appointment, Meghan Diaz reported experiencing the following: significant food cravings issues; snacking frequently in the evening; frequently drinking liquids with calories; frequently eating larger portions than normal; binge eating behaviors; and struggling with emotional eating. Meghan Diaz's Food and Mood (modified PHQ-9) score was 13. During today's appointment, Meghan Diaz denied a history of binge eating. She denied a  history of purging and engagement in other compensatory strategies. Meghan Diaz denied ever being diagnosed  with an eating disorder.   Mental Status Examination: Meghan Diaz arrived early for the appointment. She presented as appropriately dressed and groomed. Meghan Diaz appeared her stated age and demonstrated adequate orientation to time, place, person, and purpose of the appointment. She also demonstrated appropriate eye contact. No psychomotor abnormalities or behavioral peculiarities noted. Her mood was euthymic with congruent affect. Her thought processes were logical, linear, and goal-directed. No hallucinations, delusions, bizarre thinking or behavior reported or observed. Judgment, insight, and impulse control appeared to be grossly intact. There was no evidence of paraphasias (i.e., errors in speech, gross mispronunciations, and word substitutions), repetition deficits, or disturbances in volume or prosody (i.e., rhythm and intonation). There was no evidence of attention or memory impairments. Meghan Diaz denied current suicidal and homicidal ideation, plan, and intent.   The Montreal Cognitive Assessment (MoCA) was administered. The MoCA assesses different cognitive domains: attention and concentration, executive functions, memory, language, visuoconstructional skills, conceptual thinking, calculations, and orientation. Meghan Diaz received 23 out of 30 points possible on the MoCA. It is important to note that symptoms of anxiety can impact scores on the MoCA. Meghan Diaz appeared anxious during the administration of this measure as evidenced by her verbalizing, "I'm panicking" when completing the attention task requiring mental calculations. In addition, during the delayed recall task, Meghan Diaz indicated, "I would just be guessing" and added, "I'm nervous." Thus, the results should be interpreted with caution. Meghan Diaz lost one point on a language fluency task requiring Meghan Diaz to list as many words as she can in one minute starting with a specific letter. Additionally, two points were lost on the abstraction task requiring her to  identify similarities between two words. Moreover, four points were lost on the delayed recall task as she could recall one out of five words after a short delay. She was unable to recall the remaining four words with category cues, and insertions were made for three of the four remaining words. With additional multiple choice cues, Zykira recalled three additional words, but erroneously identified the last word.   Family & Psychosocial History: Meghan Diaz indicated she is not in a relationship and has one son (age 52). She noted she is not currently employed. 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 Meghan Diaz indicated her highest degree of education is a high school diploma, and one year of college. She stated her current social support system consists of "several good" friends, couple of neighbors, and two sisters. Meghan Diaz describes herself as spiritual. She added, "I go to church on my screen porch every Sunday morning."   Medical History:  Past Medical History:  Diagnosis Date  . Decreased hearing   . Depression   . Floaters in visual field   . HTN (hypertension)   . Hyperlipidemia   . Hypothyroidism   . Obesity    Past Surgical History:  Procedure Laterality Date  . ANKLE SURGERY Left    Current Outpatient Medications on File Prior to Visit  Medication Sig Dispense Refill  . levothyroxine (SYNTHROID, LEVOTHROID) 75 MCG tablet     . losartan-hydrochlorothiazide (HYZAAR) 50-12.5 MG tablet      No current facility-administered medications on file prior to visit.   Ethell denied a history of head injuries and loss of consciousness.   Mental Health History: Serine first received therapeutic services in 1985 until 1988 secondary to "getting a divorce." She added, her mother  passed away, her father-in-law died by suicide, and she lost a child. Approximately a few months ago, Shery indicated she met with a therapist, but noted, "I felt like I  wasted a lot of time." She noted she saw the therapist a few months ago to assist with emotional eating. Sandhya denied a history of hospitalization for psychiatric reasons and has never met with a psychiatrist. However, she reported a history of panic attacks and stated she was prescribed Xanax by her primary care physician. Lakitha stated her last panic attack was six to eight months ago. She currently has a prescription of Xanax. Taunja denied a family history of mental health concerns. Regarding trauma, she noted her father passed away when she was 81 years old. Danely denied a history of psychological, physical, and sexual abuse, as well as neglect.   Eladia reported experiencing the following: social withdrawal; feeling down; trouble falling and staying asleep; fatigue; overeating; decreased self-esteem; and concentration and attention difficulties. She indicated she averages approximately five hours of sleep a night. She denied experiencing the following: hopelessness; memory concerns; obsessions and compulsions; mania; hallucinations and delusions; history of and current enragement in self-harm; history of and current suicidal and homicidal ideation, plan, and intent; angry outbursts; and substance use.   Structured Assessment Results: The Patient Health Questionnaire-9 (PHQ-9) is a self-report measure that assesses symptoms and severity of depression over the course of the last two weeks. Lekeisha obtained a score of nine suggesting mild depression. Domingue finds the endorsed symptoms to be not difficult at all. Depression screen PHQ 2/9 04/22/2018  Decreased Interest 1  Down, Depressed, Hopeless 1  PHQ - 2 Score 2  Altered sleeping 1  Tired, decreased energy 1  Change in appetite 2  Feeling bad or failure about yourself  2  Trouble concentrating 1  Moving slowly or fidgety/restless 0  Suicidal thoughts 0  PHQ-9 Score 9  Difficult doing work/chores -   The Generalized Anxiety Disorder-7  (GAD-7) is a brief self-report measure that assesses symptoms of anxiety over the course of the last two weeks. Melva obtained a score of zero. GAD 7 : Generalized Anxiety Score 04/22/2018  Nervous, Anxious, on Edge 0  Control/stop worrying 0  Worry too much - different things 0  Trouble relaxing 0  Restless 0  Easily annoyed or irritable 0  Afraid - awful might happen 0  Total GAD 7 Score 0   Interventions: A chart review was conducted prior to the clinical intake interview. The MoCA, PHQ-9, and GAD-7 were administered and a clinical intake interview was completed. In addition, Scott was asked to complete a Mood and Food questionnaire to assess various behaviors related to emotional eating. Throughout session, empathic reflections and validation was provided. Continuing treatment with this provider was discussed and a treatment goal was established. Psychoeducation regarding emotional versus physical hunger was provided. Maryam was given a handout to utilize between now and the next appointment to increase awareness of hunger patterns and subsequent eating.   Provisional DSM-5 Diagnosis: 311 (F32.8) Other Specified Depressive Disorder, Emotional Eating  Plan: Aerial expressed understanding and agreement with the initial treatment plan of care. She appears able and willing to participate as evidenced by collaboration on a treatment goal, engagement in reciprocal conversation, and asking questions as needed for clarification. The next appointment will be scheduled in two weeks. The following treatment goal was established: decrease emotional eating. For the aforementioned goal, Hareem can benefit from biweekly sessions that are brief in duration for  approximately four to six sessions.

## 2018-04-30 ENCOUNTER — Ambulatory Visit (INDEPENDENT_AMBULATORY_CARE_PROVIDER_SITE_OTHER): Payer: Medicare HMO | Admitting: Bariatrics

## 2018-04-30 VITALS — BP 124/74 | HR 74 | Temp 97.7°F | Ht 67.0 in | Wt 215.0 lb

## 2018-04-30 DIAGNOSIS — E669 Obesity, unspecified: Secondary | ICD-10-CM | POA: Diagnosis not present

## 2018-04-30 DIAGNOSIS — Z6833 Body mass index (BMI) 33.0-33.9, adult: Secondary | ICD-10-CM

## 2018-04-30 DIAGNOSIS — I10 Essential (primary) hypertension: Secondary | ICD-10-CM | POA: Diagnosis not present

## 2018-04-30 DIAGNOSIS — E8881 Metabolic syndrome: Secondary | ICD-10-CM | POA: Diagnosis not present

## 2018-05-05 DIAGNOSIS — E88819 Insulin resistance, unspecified: Secondary | ICD-10-CM | POA: Insufficient documentation

## 2018-05-05 DIAGNOSIS — E8881 Metabolic syndrome: Secondary | ICD-10-CM | POA: Insufficient documentation

## 2018-05-05 NOTE — Progress Notes (Signed)
Office: [phone]  /  Fax: [phone]   HPI:   Chief Complaint: OBESITY Meghan Diaz is here to discuss her progress with her obesity treatment plan. She is on the Category 2 plan and is following her eating plan approximately 0 % of the time. She states she is walking 5 miles per day 5 times per week. Meghan Diaz fell and hurt her knee, but is improving. She has had increased stress.  Her weight is 215 lb (97.5 kg) today and has had a weight gain of 2 pounds over a period of 2 weeks since her last visit. She has lost 0 lbs since starting treatment with [place].  Hypertension Meghan Diaz is a 77 y.o. female with hypertension. She is working on weight loss to help control her blood pressure with the goal of decreasing her risk of heart attack and stroke. She is taking losartan /HCTZ 50/12.5mg . Meghan Diaz's blood pressure is currently well controlled. Meghan Diaz denies lightheadedness.  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 Although Meghan Diaz's blood glucose readings are still under good control, insulin resistance puts her at greater risk of metabolic syndrome and diabetes. Her last A1c was 5.6 and Insulin was 10.1 on 03/20/18. She is not taking metformin currently and continues to work on diet and exercise to decrease risk of diabetes.  ALLERGIES: No Known Allergies  MEDICATIONS: Current Outpatient Medications on File Prior to Visit  Medication Sig Dispense Refill  . levothyroxine (SYNTHROID, LEVOTHROID) 75 MCG tablet     . losartan-hydrochlorothiazide (HYZAAR) 50-12.5 MG tablet      No current facility-administered medications on file prior to visit.     PAST MEDICAL HISTORY: Past Medical History:  Diagnosis Date  . Decreased hearing   . Depression   . Floaters in visual field   . HTN (hypertension)   . Hyperlipidemia   . Hypothyroidism   . Obesity     PAST SURGICAL HISTORY: Past Surgical History:  Procedure  Laterality Date  . ANKLE SURGERY Left     SOCIAL HISTORY: Social History   Tobacco Use  . Smoking status: Never Smoker  . Smokeless tobacco: Never Used  Substance Use Topics  . Alcohol use: Not on file  . Drug use: Not on file    FAMILY HISTORY: Family History  Problem Relation Age of Onset  . Diabetes Mother   . Hypertension Mother   . Kidney disease Mother   . Heart disease Mother   . Cancer Father     ROS: Review of Systems  Constitutional: Negative for weight loss.  Neurological:       Negative for lightheadedness.    PHYSICAL EXAM: Blood pressure 124/74, pulse 74, temperature 97.7 F (36.5 C), temperature source Oral, height 5\' 7"  (1.702 m), weight 215 lb (97.5 kg), SpO2 98 %. Body mass index is 33.67 kg/m. Physical Exam  Constitutional: She is oriented to person, place, and time. She appears well-developed and well-nourished.  Cardiovascular: Normal rate.  Pulmonary/Chest: Effort normal.  Musculoskeletal: Normal range of motion.  Neurological: She is oriented to person, place, and time.  Skin: Skin is warm and dry.  Psychiatric: She has a normal mood and affect. Her behavior is normal.  Vitals reviewed.   RECENT LABS AND TESTS: BMET    Component Value Date/Time   NA 137 03/20/2018 1221   K 4.2 03/20/2018 1221   CL 94 (L) 03/20/2018 1221   CO2 22 03/20/2018 1221   GLUCOSE 86  03/20/2018 1221   BUN 12 03/20/2018 1221   CREATININE 0.81 03/20/2018 1221   CALCIUM 9.7 03/20/2018 1221   GFRNONAA 72 03/20/2018 1221   GFRAA 83 03/20/2018 1221   Lab Results  Component Value Date   HGBA1C 5.6 03/20/2018   Lab Results  Component Value Date   INSULIN 10.4 03/20/2018   CBC No results found for: WBC, RBC, HGB, HCT, PLT, MCV, MCH, MCHC, RDW, LYMPHSABS, MONOABS, EOSABS, BASOSABS Iron/TIBC/Ferritin/ %Sat No results found for: IRON, TIBC, FERRITIN, IRONPCTSAT Lipid Panel  No results found for: CHOL, TRIG, HDL, CHOLHDL, VLDL, LDLCALC, LDLDIRECT Hepatic  Function Panel     Component Value Date/Time   PROT 7.3 03/20/2018 1221   ALBUMIN 4.8 03/20/2018 1221   AST 18 03/20/2018 1221   ALT 12 03/20/2018 1221   ALKPHOS 75 03/20/2018 1221   BILITOT 0.6 03/20/2018 1221   No results found for: TSH   ASSESSMENT AND PLAN: Essential hypertension  Insulin resistance  Class 1 obesity with serious comorbidity and body mass index (BMI) of 33.0 to 33.9 in adult, unspecified obesity type  PLAN:  Hypertension We discussed sodium restriction, working on healthy weight loss, and a regular exercise program as the means to achieve improved blood pressure control. We will continue to monitor her blood pressure as well as her progress with the above lifestyle modifications. She will continue her antihypertensive medications as prescribed and will watch for signs of hypotension as she continues her lifestyle modifications. Meghan Diaz agreed with this plan and agreed to follow up as directed in 2 weeks.  Insulin Resistance Meghan Diaz will continue to work on weight loss, exercise, and decreasing simple carbohydrates in her diet to help decrease the risk of diabetes. She was informed that eating too many simple carbohydrates or too many calories at one sitting increases the likelihood of GI side effects. Meghan Diaz agreed decrease carbohydrates and increase protein in her diet and to follow up with [place] as directed to monitor her progress.  Obesity Meghan Diaz is currently in the action stage of change. As such, her goal is to continue with weight loss efforts. She has agreed to follow the Category 2 plan. She can use [employer] chocolate chip cookie dough and decrease to 200 calories for other snacks. Meghan Diaz has been instructed to work up to a goal of 150 minutes of combined cardio and strengthening exercise per week for weight loss and overall health benefits. We discussed the following Behavioral Modification Strategies today: increasing lean protein intake, decreasing  simple carbohydrates, increasing vegetables, increasing H2O intake, decreasing sodium intake, decrease eating out, no skipping meals, and work on meal planning and easy. cooking plans  Meghan Diaz has agreed to follow up with our clinic in 2 weeks. She was informed of the importance of frequent follow up visits to maximize her success with intensive lifestyle modifications for her multiple health conditions.   OBESITY BEHAVIORAL INTERVENTION VISIT  Today's visit was # 4   Starting weight: 212 lbs Starting date: 03/20/18 Today's weight : Weight: 215 lb (97.5 kg)  Today's date: 04/30/2018 Total lbs lost to date: 0 At least 15 minutes were spent on discussing the following behavioral intervention visit.  ASK: We discussed the diagnosis of obesity with Meghan Diaz today and Meghan Diaz agreed to give [place] permission to discuss obesity behavioral modification therapy today.  ASSESS: Meghan Diaz has the diagnosis of obesity and her BMI today is 33.67. Jerrika is in the action stage of change.  ADVISE: Meghan Diaz was educated on the multiple  health risks of obesity as well as the benefit of weight loss to improve her health. She was advised of the need for long term treatment and the importance of lifestyle modifications to improve her current health and to decrease her risk of future health problems.  AGREE: Multiple dietary modification options and treatment options were discussed and Meghan Diaz agreed to follow the recommendations documented in the above note.  ARRANGE: Meghan Diaz was educated on the importance of frequent visits to treat obesity as outlined per CMS and USPSTF guidelines and agreed to schedule her next follow up appointment today.  I, Kirke Corin, am acting as Energy manager for [employer]. Manson Passey, DO  I have reviewed the above documentation for accuracy and completeness, and I agree with the above. -Corinna Capra, DO

## 2018-05-05 NOTE — Progress Notes (Addendum)
Office: [phone]  /  Fax: [phone]   Date: May 06, 2018 Time Seen: 9:05am Duration: 32 minutes Provider: Lawerance Diaz, Psy.D. Type of Session: Individual Therapy   HPI: Meghan Diaz was referred by Dr. Corinna Diaz was seen for an initial appointment by this provider on April 22, 2018. Per the note for the last visit with Dr. Corinna Diaz on April 16, 2018, "Meghan Diaz currently struggling and finding it "difficult to commit". She is eating more at night. She is angry with herself (tearful). She has appropriate amount of hunger and is not hungry." Regarding the prescribed meal plan, Meghan Diaz reported, "I haven't done it." She explained, "I just can't get it in my head to get on this program." In addition, per the note for the initial visit with Dr. Corinna Diaz on March 20, 2018, "Meghan Diaz struggled with obesity for years and has been unsuccessful in either losing weight or maintaining long term weight loss. She has increased her portion size and she has cravings for salty, crunchy and sweet foods. She is snacking at night."  During the initial appointment, Meghan Diaz reported experiencing the following: significant food cravings issues; snacking frequently in the evening; frequently drinking liquids with calories; frequently eating larger portions than normal; binge eating behaviors; and struggling with emotional eating. Meghan Diaz's Food and Mood (modified PHQ-9) score was13. During the initial appointment with this provider, Meghan Diaz reported, "I am just disgusted when I look at myself in the mirror." She described decreased self-esteem and noted, "It's like I don't love me like I love everyone else." Meghan Diaz reported her last episode of emotional eating was the night before the initial appointment with this provider. She explained she went out to eat with her neighbors at [employer]. She noted she consumed pecan pancakes. Later in the evening, she consumed popcorn, beans, and toast. Meghan Diaz  indicated, "I was just eating." In addition, Meghan Diaz noted an increase in going out to eat recently. Moreover, she added, "I can usually control it during the day. It is usually the evening." Meghan Diaz denied a history of binge eating. She denied a history of purging and engagement in other compensatory strategies. Meghan Diaz denied ever being diagnosed with an eating disorder. Furthermore, Meghan Diaz was asked to complete a questionnaire assessing various behaviors related to emotional eating. Meghan Diaz endorsed the following: overeat when you are celebrating, experience food cravings on a regular basis, eat certain foods when you are anxious, stressed, depressed, or your feelings are hurt, use food to help you cope with emotional situations, find food is comforting to you, overeat when you are angry or upset, overeat when you are worried about something, overeat frequently when you are bored or lonely, overeat when you are alone, but eat much less when you are with other people and eat to help you stay awake. She further added eating as a reward.   Session Content: Session focused on the following treatment goal: decrease emotional eating. The session was initiated with the administration of the PHQ-9 and GAD-7, as well as a brief check-in. Meghan Diaz reported she has been busy with work and attended a Pharmacist, hospital for one week. During the furniture market, she discussed engaging in mindless eating. Nevertheless, she indicated walking a lot and consuming water throughout the day. In addition, she indicated she tried to make healthier choices and avoided candy during market weer. Notably, she discussed referencing the handout on physical versus emotional hunger since the last appointment. Moreover, Meghan Diaz discussed engaging in boundary setting with neighbors as they frequently take  her out to eat and bring her food. She noted she was "so proud" of herself. Meghan Diaz indicated a plan to resume following the prescribed meal plan on  November 1st, as tomorrow is herbirhtday. Thus, this was explored further and all or nothing thinking was discussed. Meghan of session focused on triggers for emotional eating. Psychoeducation regarding triggers was provided along with a handout. This provider also discussed various skills to utilize when going out to eat for her birthday tomorrow with friends. For example, it was recommended that Meghan Diaz place her utensils down while conversing during a meal to avoid mindless eating. Overall, Meghan Diaz was receptive to today's appointment as evidenced by her openness to sharing, responsiveness to feedback, and willingness to incorporate discussed skills from today's appointment.    Mental Status Examination: Meghan Diaz arrived at the time of her appointment; however, the appointment was initiated late due to the check-in process. She presented as appropriately dressed and groomed. Meghan Diaz appeared her stated age and demonstrated adequate orientation to time, place, person, and purpose of the appointment. She also demonstrated appropriate eye contact. No psychomotor abnormalities or behavioral peculiarities noted. Her mood was euthymic with congruent affect. Her thought processes were logical, linear, and goal-directed. No hallucinations, delusions, bizarre thinking or behavior reported or observed. Judgment, insight, and impulse control appeared to be grossly intact. There was no evidence of paraphasias (i.e., errors in speech, gross mispronunciations, and word substitutions), repetition deficits, or disturbances in volume or prosody (i.e., rhythm and intonation). There was no evidence of attention or memory impairments. Meghan Diaz denied current suicidal and homicidal ideation, intent or plan.  Structured Assessment Results: The Patient Health Questionnaire-9 (PHQ-9) is a self-report measure that assesses symptoms and severity of depression over the course of the last two weeks. Meghan Diaz obtained a score of three  suggesting minimal depression. Meghan Diaz finds the endorsed symptoms to be not difficult at all. Depression screen [facility] 2/9 05/06/2018  Decreased Interest 0  Down, Depressed, Hopeless 0  PHQ - 2 Score 0  Altered sleeping 1  Tired, decreased energy 0  Change in appetite 2  Feeling bad or failure about yourself  0  Trouble concentrating 0  Moving slowly or fidgety/restless 0  Suicidal thoughts 0  PHQ-9 Score 3  Difficult doing work/chores -   The Generalized Anxiety Disorder-7 (GAD-7) is a brief self-report measure that assesses symptoms of anxiety over the course of the last two weeks. Fatimata obtained a score of zero. GAD 7 : Generalized Anxiety Score 05/06/2018  Nervous, Anxious, on Edge 0  Control/stop worrying 0  Worry too much - different things 0  Trouble relaxing 0  Restless 0  Easily annoyed or irritable 0  Afraid - awful might happen 0  Total GAD 7 Score 0   Interventions: Abbigael was administered the PHQ-9 and GAD-7 for symptom monitoring. Content from the last session was reviewed. Throughout today's session, empathic reflections and validation were provided. Psychoeducation regarding all or nothing thinking as well as triggers for emotional eating was provided. This provider also discussed skills to be utilized with specific triggers for emotional eating.  DSM-5 Diagnosis: 311 (F32.8) Other Specified Depressive Disorder, Emotional Eating  Treatment Goal & Progress: My was seen for an initial appointment with this provider on April 22, 2018 during which the following treatment goal was established: decrease emotional eating. Teddi has demonstrated progress in her goal of decreasing emotional eating as evidenced by increased awareness of physical versus emotional hunger. She was also receptive to identifying her triggers for  emotional eating during today's appointment.  Plan: Ercia continues to appear able and willing to participate as evidenced by engagement in reciprocal  conversation, and asking questions for clarification as appropriate. The next appointment will be scheduled in two weeks. The next session will focus on reviewing triggers for emotional eating and working toward the established treatment goal.

## 2018-05-06 ENCOUNTER — Ambulatory Visit (INDEPENDENT_AMBULATORY_CARE_PROVIDER_SITE_OTHER): Payer: Medicare HMO | Admitting: Psychology

## 2018-05-06 DIAGNOSIS — F3289 Other specified depressive episodes: Secondary | ICD-10-CM | POA: Diagnosis not present

## 2018-05-13 ENCOUNTER — Encounter (INDEPENDENT_AMBULATORY_CARE_PROVIDER_SITE_OTHER): Payer: Self-pay | Admitting: Bariatrics

## 2018-05-13 ENCOUNTER — Ambulatory Visit (INDEPENDENT_AMBULATORY_CARE_PROVIDER_SITE_OTHER): Payer: Medicare HMO | Admitting: Bariatrics

## 2018-05-13 VITALS — BP 139/71 | HR 68 | Temp 97.7°F | Ht 67.0 in | Wt 213.0 lb

## 2018-05-13 DIAGNOSIS — Z6833 Body mass index (BMI) 33.0-33.9, adult: Secondary | ICD-10-CM | POA: Diagnosis not present

## 2018-05-13 DIAGNOSIS — E8881 Metabolic syndrome: Secondary | ICD-10-CM | POA: Diagnosis not present

## 2018-05-13 DIAGNOSIS — E669 Obesity, unspecified: Secondary | ICD-10-CM | POA: Diagnosis not present

## 2018-05-13 DIAGNOSIS — I10 Essential (primary) hypertension: Secondary | ICD-10-CM | POA: Diagnosis not present

## 2018-05-13 DIAGNOSIS — F3289 Other specified depressive episodes: Secondary | ICD-10-CM

## 2018-05-14 NOTE — Progress Notes (Signed)
Office: [phone]  /  Fax: [phone]   HPI:   Chief Complaint: OBESITY Meghan Diaz is here to discuss her progress with her obesity treatment plan. She is on the Category 2 plan and is following her eating plan approximately 100 % of the time. She states she is exercising 0 minutes 0 times per week. Shagun has been following the plan 100% over the last 5 days. She is back on track, but states that night time is hard for her.  Her weight is 213 lb (96.6 kg) today and has had a weight loss of 2 pounds over a period of 1 week since her last visit. She has lost 0 lbs since starting treatment with [place].  Hypertension Meghan Diaz is a 77 y.o. female with hypertension. She is working on weight loss to help control her blood pressure with the goal of decreasing her risk of heart attack and stroke. She is taking losartan-HCTZ 50-12.5mg . Meghan Diaz's blood pressure is reasonably well controlled and she denies lightheadedness.  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 Although Meghan Diaz's blood glucose readings are still under good control, insulin resistance puts her at greater risk of metabolic syndrome and diabetes. Her last A1c was 5.6 and Insulin was 10.4 on 03/20/18. She is not taking medications currently and continues to work on diet and exercise to decrease risk of diabetes.  Depression with emotional eating behaviors Meghan Diaz is struggling with emotional eating and using food for comfort to the extent that it is negatively impacting her health. She shows no sign of suicidal or homicidal ideations.  ALLERGIES: No Known Allergies  MEDICATIONS: Current Outpatient Medications on File Prior to Visit  Medication Sig Dispense Refill  . levothyroxine (SYNTHROID, LEVOTHROID) 75 MCG tablet     . losartan-hydrochlorothiazide (HYZAAR) 50-12.5 MG tablet      No current facility-administered medications on file prior to visit.      PAST MEDICAL HISTORY: Past Medical History:  Diagnosis Date  . Decreased hearing   . Depression   . Floaters in visual field   . HTN (hypertension)   . Hyperlipidemia   . Hypothyroidism   . Obesity     PAST SURGICAL HISTORY: Past Surgical History:  Procedure Laterality Date  . ANKLE SURGERY Left     SOCIAL HISTORY: Social History   Tobacco Use  . Smoking status: Never Smoker  . Smokeless tobacco: Never Used  Substance Use Topics  . Alcohol use: Not on file  . Drug use: Not on file    FAMILY HISTORY: Family History  Problem Relation Age of Onset  . Diabetes Mother   . Hypertension Mother   . Kidney disease Mother   . Heart disease Mother   . Cancer Father     ROS: Review of Systems  Constitutional: Positive for weight loss.  Neurological:       Negative for lightheadedness.  Psychiatric/Behavioral: Positive for depression. Negative for suicidal ideas.       Negative for homicidal ideations.    PHYSICAL EXAM: Blood pressure 139/71, pulse 68, temperature 97.7 F (36.5 C), temperature source Oral, height 5\' 7"  (1.702 m), weight 213 lb (96.6 kg), SpO2 100 %. Body mass index is 33.36 kg/m. Physical Exam  Constitutional: She is oriented to person, place, and time. She appears well-developed and well-nourished.  Cardiovascular: Normal rate.  Pulmonary/Chest: Effort normal.  Musculoskeletal: Normal range of motion.  Neurological: She is oriented to person, place, and time.  Skin: Skin is warm and dry.  Psychiatric: She has a normal mood and affect. Her behavior is normal.  Vitals reviewed.   RECENT LABS AND TESTS: BMET    Component Value Date/Time   NA 137 03/20/2018 1221   K 4.2 03/20/2018 1221   CL 94 (L) 03/20/2018 1221   CO2 22 03/20/2018 1221   GLUCOSE 86 03/20/2018 1221   BUN 12 03/20/2018 1221   CREATININE 0.81 03/20/2018 1221   CALCIUM 9.7 03/20/2018 1221   GFRNONAA 72 03/20/2018 1221   GFRAA 83 03/20/2018 1221   Lab Results   Component Value Date   HGBA1C 5.6 03/20/2018   Lab Results  Component Value Date   INSULIN 10.4 03/20/2018   CBC No results found for: WBC, RBC, HGB, HCT, PLT, MCV, MCH, MCHC, RDW, LYMPHSABS, MONOABS, EOSABS, BASOSABS Iron/TIBC/Ferritin/ %Sat No results found for: IRON, TIBC, FERRITIN, IRONPCTSAT Lipid Panel  No results found for: CHOL, TRIG, HDL, CHOLHDL, VLDL, LDLCALC, LDLDIRECT Hepatic Function Panel     Component Value Date/Time   PROT 7.3 03/20/2018 1221   ALBUMIN 4.8 03/20/2018 1221   AST 18 03/20/2018 1221   ALT 12 03/20/2018 1221   ALKPHOS 75 03/20/2018 1221   BILITOT 0.6 03/20/2018 1221   No results found for: TSH  ASSESSMENT AND PLAN: Essential hypertension  Insulin resistance  Other depression - with emotional eating  Class 1 obesity with serious comorbidity and body mass index (BMI) of 33.0 to 33.9 in adult, unspecified obesity type  PLAN:  Hypertension We discussed sodium restriction, working on healthy weight loss, and a regular exercise program as the means to achieve improved blood pressure control. We will continue to monitor her blood pressure as well as her progress with the above lifestyle modifications. She will continue her antihypertensive medications as prescribed and will watch for signs of hypotension as she continues her lifestyle modifications. Meghan Diaz agreed with this plan and agreed to follow up as directed in 2 weeks.  Insulin Resistance Meghan Diaz will continue to work on weight loss, exercise, and decreasing simple carbohydrates in her diet to help decrease the risk of diabetes. She was informed that eating too many simple carbohydrates or too many calories at one sitting increases the likelihood of GI side effects. She agrees to continue decreasing carbohydrates and increase protein in her diet. Meghan Diaz agreed to follow up with [place] as directed to monitor her progress.  Depression with Emotional Eating Behaviors We discussed behavior modification  techniques today to help Meghan Diaz deal with her emotional eating and depression. She has agreed to continue to see Meghan Diaz and we discussed cognitive behavioral therapy strategies for emotional eating. Meghan Diaz agreed to follow up as directed in 2 weeks.  Obesity Meghan Diaz is currently in the action stage of change. As such, her goal is to continue with weight loss efforts. She has agreed to follow the Category 2 plan and she will do more thinking and planning. She will also increase protein in her diet. Meghan Diaz has been instructed to work up to a goal of 150 minutes of combined cardio and strengthening exercise per week for weight loss and overall health benefits. We discussed the following Behavioral Modification Strategies today: increasing lean protein intake, decreasing simple carbohydrates, increasing vegetables, decreasing sodium intake, and keeping healthy foods in the home.  Meghan Diaz has agreed to follow up with our clinic in 2 weeks. She was informed of the importance of frequent follow up visits to maximize her success with intensive lifestyle modifications for her  multiple health conditions.   OBESITY BEHAVIORAL INTERVENTION VISIT  Today's visit was # 5   Starting weight: 212 lbs Starting date: 03/20/18 Today's weight : Weight: 213 lb (96.6 kg)  Today's date: 05/13/2018 Total lbs lost to date: 0 At least 15 minutes were spent on discussing the following behavioral intervention visit.  ASK: We discussed the diagnosis of obesity with Meghan Diaz today and Hallelujah agreed to give [place] permission to discuss obesity behavioral modification therapy today.  ASSESS: Oanh has the diagnosis of obesity and her BMI today is 33.35. Chantille is in the action stage of change.   ADVISE: Deette was educated on the multiple health risks of obesity as well as the benefit of weight loss to improve her health. She was advised of the need for long term treatment and the importance of lifestyle  modifications to improve her current health and to decrease her risk of future health problems.  AGREE: Multiple dietary modification options and treatment options were discussed and Miabella agreed to follow the recommendations documented in the above note.  ARRANGE: Katyra was educated on the importance of frequent visits to treat obesity as outlined per CMS and USPSTF guidelines and agreed to schedule her next follow up appointment today.  I, Kirke Corin, am acting as Energy manager for [employer]. Manson Passey, DO  I have reviewed the above documentation for accuracy and completeness, and I agree with the above. -Corinna Capra, DO

## 2018-05-19 NOTE — Progress Notes (Signed)
Office: [phone]  /  Fax: [phone]   Date: May 20, 2018   Time Seen: 8:30am Duration: 37 minutes Provider: Lawerance Cruel, Psy.D. Type of Session: Individual Therapy   HPI: Edwenawas referred by Dr. Corinna Capra was seen for an initial appointment by this provider on April 22, 2018. Per the note for thelastvisit withDr. Cindy Hazy April 16, 2018,"Edwenais currently struggling and finding it "difficult to commit". She is eating more at night. She is angry with herself (tearful). She has appropriate amount of hunger and is not hungry."Regarding the prescribed meal plan, Meghan Diaz reported, "I haven't done it." She explained, "I just can't get it in my head to get on this program." In addition, per the note for the initial visit withDr. Cindy Hazy March 20, 2018,"Edwenahas struggled with obesity for years and has been unsuccessful in either losing weight or maintaining long term weight loss. She has increased her portion size and she has cravings for salty, crunchy and sweet foods. She is snacking at night."During the initial appointment, Edwenareported experiencing the following:significant food cravings issues; snacking frequently in the evening; frequently drinking liquids with calories; frequently eating larger portions than normal; binge eating behaviors; and struggling with emotional eating.Meghan Diaz's Food and Mood (modified PHQ-9) score was13.During the initial appointment with this provider, Meghan Diaz reported, "I am just disgusted when I look at myself in the mirror." She described decreased self-esteem and noted, "It's like I don't love me like I love everyone else." Meghan Diaz reported her last episode of emotional eating was the night before the initial appointment with this provider. She explained she went out to eat with her neighbors at [employer]. She noted she consumed pecan pancakes. Later in the evening, she consumed popcorn, beans, and toast. Meghan Diaz  indicated, "I was just eating." In addition, Meghan Diaz noted anincreasein going out to eat recently. Moreover, she added, "I can usually control it during the day. It is usually the evening."Meghan Diaz denied a history of binge eating. She denied a history of purging and engagement in other compensatory strategies. Meghan Diaz denied ever being diagnosed with an eating disorder.Furthermore, Edwenawas asked to complete a questionnaire assessing various behaviors related to emotional eating. Edwenaendorsed the following: overeat when you are celebrating, experience food cravings on a regular basis, eat certain foods when you are anxious, stressed, depressed, or your feelings are hurt, use food to help you cope with emotional situations, find food is comforting to you, overeat when you are angry or upset, overeat when you are worried about something, overeat frequently when you are bored or lonely, overeat when you are alone, but eat much less when you are with other people and eat to help you stay awake.She further added eating as a reward.  Session Content: Session focused on the following treatment goal: decrease emotional eating. The session was initiated with the administration of the PHQ-9 and GAD-7, as well as a brief check-in. Meghan Diaz described feeling worthless and reported difficulty learning how to use her new laptop. She also described interpersonal issues that arose secondary to the gifts she received on her birthday. As such, this provider processed associated thoughts and feelings with Meghan Diaz. Regarding eating Meghan Diaz reported, "It has been a challenge." She discussed going into three grocery stores recently for "something good."  Thus, today's appointment focused on discussing the utilization of snack calories for specific foods she enjoys and setting a goal to increase her protein intake. In addition, Meghan Diaz was provided psychoeducation regarding the connection between thoughts, feelings, and behaviors.  Psychoeducation regarding  thought defusion was also provided and she was led through a thought defusion exercise for which she utilized the thought "I am worthless." A handout was provided to her with various exercises.  Moreover, psychoeducation regarding pleasurable activities was provided, including its impact on emotional eating. Meghan Diaz was encouraged to engage in one activity a day between now and the next appointment. She agreed. Overall, Meghan Diaz was receptive to today's appointment as evidenced by her openness to sharing, responsiveness to feedback, and willingness to practice thought defusion and engage in pleasurable activities.  Mental Status Examination: Meghan Diaz arrived on time for the appointment. She presented as appropriately dressed and groomed. Meghan Diaz appeared her stated age and demonstrated adequate orientation to time, place, person, and purpose of the appointment. She also demonstrated appropriate eye contact. No psychomotor abnormalities or behavioral peculiarities noted. Her mood was euthymic with congruent affect. Her thought processes were logical, linear, and goal-directed. No hallucinations, delusions, bizarre thinking or behavior reported or observed. Judgment, insight, and impulse control appeared to be grossly intact. There was no evidence of paraphasias (i.e., errors in speech, gross mispronunciations, and word substitutions), repetition deficits, or disturbances in volume or prosody (i.e., rhythm and intonation). There was no evidence of attention or memory impairments. Meghan Diaz denied current suicidal and homicidal ideation, intent or plan.  Structured Assessment Results: The Patient Health Questionnaire-9 (PHQ-9) is a self-report measure that assesses symptoms and severity of depression over the course of the last two weeks. Meghan Diaz obtained a score of six suggesting mild depression. Meghan Diaz finds the endorsed symptoms to be somewhat difficult. Depression screen PHQ 2/9 05/20/2018    Decreased Interest 1  Down, Depressed, Hopeless 1  PHQ - 2 Score 2  Altered sleeping 0  Tired, decreased energy 0  Change in appetite 1  Feeling bad or failure about yourself  2  Trouble concentrating 1  Moving slowly or fidgety/restless 0  Suicidal thoughts 0  PHQ-9 Score 6  Difficult doing work/chores -   The Generalized Anxiety Disorder-7 (GAD-7) is a brief self-report measure that assesses symptoms of anxiety over the course of the last two weeks. Meghan Diaz obtained a score of one suggesting minimal anxiety. GAD 7 : Generalized Anxiety Score 05/20/2018  Nervous, Anxious, on Edge 1  Control/stop worrying 0  Worry too much - different things 0  Trouble relaxing 0  Restless 0  Easily annoyed or irritable 0  Afraid - awful might happen 0  Total GAD 7 Score 1  Anxiety Difficulty Not difficult at all   Interventions: Meghan Diaz was administered the PHQ-9 and GAD-7 for symptom monitoring. Content from the last session was reviewed. Throughout today's session, empathic reflections and validation were provided. This provider assisted Meghan Diaz in processing thoughts and feelings associated with recent events. Psychoeducation regarding the connection between thoughts, feelings, and behaviors; thought defusion; and pleasurable activities was provided. Meghan Diaz was led through a thought defusion exercise.  DSM-5 Diagnosis: 311 (F32.8) Other Specified Depressive Disorder, Emotional Eating  Treatment Goal & Progress: Meghan Diaz was seen for an initial appointment with this provider on April 22, 2018 during which the following treatment goal was established: decrease emotional eating. Kimila has demonstrated progress in her goal of decreasing emotional eating as evidenced by increased awareness related to hunger patterns and triggers for emotional eating.  During today's appointment, Quaneisha demonstrated willingness to practice learning skills.  Plan: Arraya continues to appear able and willing to participate  as evidenced by engagement in reciprocal conversation, and asking questions for clarification as appropriate.The next appointment will  be scheduled in three weeks as Nevaen will be traveling for the Thanksgiving holiday. The next session will focus on reviewing thought defusion and pleasurable activities and working towards the established treatment goal.

## 2018-05-20 ENCOUNTER — Ambulatory Visit (INDEPENDENT_AMBULATORY_CARE_PROVIDER_SITE_OTHER): Payer: Medicare HMO | Admitting: Psychology

## 2018-05-20 DIAGNOSIS — F3289 Other specified depressive episodes: Secondary | ICD-10-CM

## 2018-05-27 ENCOUNTER — Ambulatory Visit (INDEPENDENT_AMBULATORY_CARE_PROVIDER_SITE_OTHER): Payer: Self-pay | Admitting: Bariatrics

## 2018-06-02 NOTE — Progress Notes (Signed)
Office: [phone]  /  Fax: [phone]   Date: June 10, 2018 Time Seen: 9:33am Duration: 27 minutes Provider: Lawerance Cruel, Psy.D. Type of Session: Individual Therapy   HPI: Meghan Diaz referred by Dr. Liliane Shi seen for an initial appointment by this provider on April 22, 2018. Per the note for thelastvisit withDr. Cindy Hazy April 16, 2018,"Edwenais currently struggling and finding it "difficult to commit". She is eating more at night. She is angry with herself (tearful). She has appropriate amount of hunger and is not hungry."Regarding the prescribed meal plan, Meghan Diaz reported, "I haven't done it." She explained, "I just can't get it in my head to get on this program."In addition, per the note for the initial visit withDr. Cindy Hazy March 20, 2018,"Edwenahas struggled with obesity for years and has been unsuccessful in either losing weight or maintaining long term weight loss. She has increased her portion size and she has cravings for salty, crunchy and sweet foods. She is snacking at night."During the initial appointment, Edwenareported experiencing the following:significant food cravings issues; snacking frequently in the evening; frequently drinking liquids with calories; frequently eating larger portions than normal; binge eating behaviors; and struggling with emotional eating.Meghan Diaz's Food and Mood (modified PHQ-9) score was13.Duringtheinitial appointment with this provider, Meghan Diaz reported, "I am just disgusted when I look at myself in the mirror." She described decreased self-esteem and noted, "It's like I don't love me like I love everyone else." Meghan Diaz reported her last episode of emotional eating wasthe night before the initial appointment with this provider. She explained she went out to eat with her neighbors at [employer]. She noted she consumed pecan pancakes. Later in the evening, she consumed popcorn, beans, and toast. Denym  indicated, "I was just eating." In addition, Meghan Diaz noted anincreasein going out to eat recently. Moreover, she added, "I can usually control it during the day. It is usually the evening."Meghan Diaz denied a history of binge eating. She denied a history of purging and engagement in other compensatory strategies. Meghan Diaz denied ever being diagnosed with an eating disorder.Furthermore,Meghan Diaz asked to complete a questionnaire assessing various behaviors related to emotional eating. Meghan Diaz the following: overeat when you are celebrating, experience food cravings on a regular basis, eat certain foods when you are anxious, stressed, depressed, or your feelings are hurt, use food to help you cope with emotional situations, find food is comforting to you, overeat when you are angry or upset, overeat when you are worried about something, overeat frequently when you are bored or lonely, overeat when you are alone, but eat much less when you are with other people and eat to help you stay awake.She further added eating as a reward.  Session Content: Session focused on the following treatment goal: decrease emotional eating. The session was initiated with the administration of the PHQ-9 and GAD-7, as well as a brief check-in. Meghan Diaz reported, "Eating is pleasure." This was explored further, and this provider engaged in thought challenging and reframing. She described engaging in all or nothing thinking; therefore, this provider discussed the utilization of thought defusion to help cope. In addition, further psychoeducation regarding the impact of all or nothing thinking was provided along with a handout. Meghan Diaz was receptive to today's session as evidenced by her openness to sharing, responsiveness to feedback, and willingness to engage in thought defusion.  Mental Status Examination: Meghan Diaz arrived on time for the appointment; however, the appointment was initiated late due to this provider. She presented as  appropriately dressed and groomed. Meghan Diaz appeared her stated  age and demonstrated adequate orientation to time, place, person, and purpose of the appointment. She also demonstrated appropriate eye contact. No psychomotor abnormalities or behavioral peculiarities noted. Her mood was euthymic with congruent affect. Her thought processes were logical, linear, and goal-directed. No hallucinations, delusions, bizarre thinking or behavior reported or observed. Judgment, insight, and impulse control appeared to be grossly intact. There was no evidence of paraphasias (i.e., errors in speech, gross mispronunciations, and word substitutions), repetition deficits, or disturbances in volume or prosody (i.e., rhythm and intonation). There was no evidence of attention or memory impairments. Meghan Diaz denied current suicidal and homicidal ideation, intent or plan.  Structured Assessment Results: The Patient Health Questionnaire-9 (PHQ-9) is a self-report measure that assesses symptoms and severity of depression over the course of the last two weeks. Meghan Diaz obtained a score of 3 suggesting minimal depression. Meghan Diaz finds the endorsed symptoms to be not difficult at all. [facility] screen [facility] 06/10/2018  Decreased Interest 0  Down, Depressed, Hopeless 0  PHQ - 2 Score 0  Altered sleeping 0  Tired, decreased energy 0  Change in appetite 3  Feeling bad or failure about yourself  0  Trouble concentrating 0  Moving slowly or fidgety/restless 0  Suicidal thoughts 0  PHQ-9 Score 3  Difficult doing work/chores -   The Generalized Anxiety Disorder-7 (GAD-7) is a brief self-report measure that assesses symptoms of anxiety over the course of the last two weeks. Meghan Diaz obtained a score of zero.  GAD 7 : Generalized Anxiety Score 06/10/2018  Nervous, Anxious, on Edge 0  Control/stop worrying 0  Worry too much - different things 0  Trouble relaxing 0  Restless 0  Easily annoyed or irritable 0  Afraid - awful might  happen 0  Total GAD 7 Score 0  Anxiety Difficulty -   Interventions: Meghan Diaz was administered the PHQ-9 and GAD-7 for symptom monitoring. Content from the last session was reviewed. Throughout today's session, empathic reflections and validation were provided. This provider engaged in thought challenging and cognitive reframing. Psychoeducation regarding all or nothing thinking was provided along with psychoeducation regarding the utilization of thought defusion as a coping skill.  DSM-5 Diagnosis: 311 (F32.8) Other Specified Depressive Disorder, Emotional Eating Behaviors  Treatment Goal & Progress: Meghan Diaz was seen for an initial appointment with this provider on April 22, 2018 during which the following treatment goal was established: decrease emotional eating. Meghan Diaz has demonstrated progress in her goal of decreasing emotional eating as evidenced by increased awareness of hunger patterns and triggers for emotional eating. She demonstrates willingness to engage in learned skills and explore barriers further.   Plan: Meghan Diaz continues to appear able and willing to participate as evidenced by engagement in reciprocal conversation, and asking questions for clarification as appropriate. The next appointment will be scheduled in two weeks. The next session will focus on cognitive distortions.

## 2018-06-10 ENCOUNTER — Encounter (INDEPENDENT_AMBULATORY_CARE_PROVIDER_SITE_OTHER): Payer: Self-pay | Admitting: Bariatrics

## 2018-06-10 ENCOUNTER — Ambulatory Visit (INDEPENDENT_AMBULATORY_CARE_PROVIDER_SITE_OTHER): Payer: Medicare HMO | Admitting: Bariatrics

## 2018-06-10 ENCOUNTER — Ambulatory Visit (INDEPENDENT_AMBULATORY_CARE_PROVIDER_SITE_OTHER): Payer: Medicare HMO | Admitting: Psychology

## 2018-06-10 VITALS — BP 154/86 | HR 65 | Temp 97.6°F | Ht 67.0 in | Wt 222.0 lb

## 2018-06-10 DIAGNOSIS — E669 Obesity, unspecified: Secondary | ICD-10-CM

## 2018-06-10 DIAGNOSIS — E8881 Metabolic syndrome: Secondary | ICD-10-CM | POA: Diagnosis not present

## 2018-06-10 DIAGNOSIS — Z6834 Body mass index (BMI) 34.0-34.9, adult: Secondary | ICD-10-CM

## 2018-06-10 DIAGNOSIS — F3289 Other specified depressive episodes: Secondary | ICD-10-CM | POA: Diagnosis not present

## 2018-06-10 DIAGNOSIS — I10 Essential (primary) hypertension: Secondary | ICD-10-CM

## 2018-06-13 NOTE — Progress Notes (Signed)
Office: [phone]  /  Fax: [phone]   HPI:   Chief Complaint: OBESITY Meghan Diaz is here to discuss her progress with her obesity treatment plan. She is on the  follow the Category 2 plan and is following her eating plan approximately 0 % of the time. She states she is exercising 0 minutes 0 times per week. Meghan Diaz She has struggled with Thanksgiving and increased sweets and salt.  Her weight is 222 lb (100.7 kg) today and has had a weight gained of 9 pounds over a period of 4 weeks since her last visit. She has lost 0 lbs since starting treatment with [place].  Hypertension Meghan Diaz is a 77 y.o. female with hypertension.  Meghan Diaz denies chest pain or shortness of breath on exertion. She is working weight loss to help control her blood pressure with the goal of decreasing her risk of heart attack and stroke. Meghan Diaz blood pressure is elevated today.   Insulin Resistance Meghan Diaz has a diagnosis of insulin resistance based on her elevated fasting insulin level of 10 and HgA1c of 5.6. Although Meghan Diaz's blood glucose readings are still under good control, insulin resistance puts her at greater risk of metabolic syndrome and diabetes. She is not taking metformin currently and continues to work on diet and exercise to decrease risk of diabetes. She denies polyphagia.   ALLERGIES: No Known Allergies  MEDICATIONS: Current Outpatient Medications on File Prior to Visit  Medication Sig Dispense Refill  . levothyroxine (SYNTHROID, LEVOTHROID) 75 MCG tablet     . losartan-hydrochlorothiazide (HYZAAR) 50-12.5 MG tablet      No current facility-administered medications on file prior to visit.     PAST MEDICAL HISTORY: Past Medical History:  Diagnosis Date  . Decreased hearing   . Depression   . Floaters in visual field   . HTN (hypertension)   . Hyperlipidemia   . Hypothyroidism   . Obesity     PAST SURGICAL HISTORY: Past Surgical History:  Procedure Laterality Date  .  ANKLE SURGERY Left     SOCIAL HISTORY: Social History   Tobacco Use  . Smoking status: Never Smoker  . Smokeless tobacco: Never Used  Substance Use Topics  . Alcohol use: Not on file  . Drug use: Not on file    FAMILY HISTORY: Family History  Problem Relation Age of Onset  . Diabetes Mother   . Hypertension Mother   . Kidney disease Mother   . Heart disease Mother   . Cancer Father     ROS: Review of Systems  Constitutional: Negative for weight loss.  Respiratory: Negative for shortness of breath.   Cardiovascular: Negative for chest pain.  Gastrointestinal: Positive for vomiting.  Endo/Heme/Allergies:       Negative for polyphagia    PHYSICAL EXAM: Blood pressure (!) 154/86, pulse 65, temperature 97.6 F (36.4 C), temperature source Oral, height 5\' 7"  (1.702 m), weight 222 lb (100.7 kg), SpO2 98 %. Body mass index is 34.77 kg/m. Physical Exam  Constitutional: She is oriented to person, place, and time. She appears well-developed and well-nourished.  HENT:  Head: Normocephalic.  Eyes: Pupils are equal, round, and reactive to light.  Neck: Normal range of motion.  Cardiovascular: Normal rate.  Pulmonary/Chest: Effort normal.  Musculoskeletal: Normal range of motion.  Neurological: She is alert and oriented to person, place, and time.  Skin: Skin is warm and dry.  Psychiatric: She has a normal mood and affect. Her behavior is normal.  Vitals reviewed.  RECENT LABS AND TESTS: BMET    Component Value Date/Time   NA 137 03/20/2018 1221   K 4.2 03/20/2018 1221   CL 94 (L) 03/20/2018 1221   CO2 22 03/20/2018 1221   GLUCOSE 86 03/20/2018 1221   BUN 12 03/20/2018 1221   CREATININE 0.81 03/20/2018 1221   CALCIUM 9.7 03/20/2018 1221   GFRNONAA 72 03/20/2018 1221   GFRAA 83 03/20/2018 1221   Lab Results  Component Value Date   HGBA1C 5.6 03/20/2018   Lab Results  Component Value Date   INSULIN 10.4 03/20/2018   CBC No results found for: WBC, RBC,  HGB, HCT, PLT, MCV, MCH, MCHC, RDW, LYMPHSABS, MONOABS, EOSABS, BASOSABS Iron/TIBC/Ferritin/ %Sat No results found for: IRON, TIBC, FERRITIN, IRONPCTSAT Lipid Panel  No results found for: CHOL, TRIG, HDL, CHOLHDL, VLDL, LDLCALC, LDLDIRECT Hepatic Function Panel     Component Value Date/Time   PROT 7.3 03/20/2018 1221   ALBUMIN 4.8 03/20/2018 1221   AST 18 03/20/2018 1221   ALT 12 03/20/2018 1221   ALKPHOS 75 03/20/2018 1221   BILITOT 0.6 03/20/2018 1221   No results found for: TSH  ASSESSMENT AND PLAN: Essential hypertension  Insulin resistance  Class 1 obesity with serious comorbidity and body mass index (BMI) of 34.0 to 34.9 in adult, unspecified obesity type  PLAN: Hypertension We discussed sodium restriction, working on healthy weight loss, and a regular exercise program as the means to achieve improved blood pressure control. Toinette agreed with this plan and agreed to follow up as directed. We will continue to monitor her blood pressure as well as her progress with the above lifestyle modifications. She will continue her medications as prescribed and will watch for signs of hypotension as she continues her lifestyle modifications.  Insulin Resistance Jessalynn will continue to work on weight loss, exercise, and decreasing simple carbohydrates in her diet to help decrease the risk of diabetes. We dicussed metformin including benefits and risks. She was informed that eating too many simple carbohydrates or too many calories at one sitting increases the likelihood of GI side effects. Zulma will continue to decrease carbs and increase protein intake.  [name] agreed to follow up with [place] as directed to monitor her progress.  Obesity Meghan Diaz is currently in the action stage of change. As such, her goal is to continue with weight loss efforts She has agreed to follow the Category 2 plan Meghan Diaz has been instructed to work up to a goal of 150 minutes of combined cardio and strengthening  exercise per week for weight loss and overall health benefits. We discussed the following Behavioral Modification Strategies today: increasing lean protein intake, decreasing simple carbohydrates, increasing water intake, increasing vegetables, no skipping meals, decrease eating out and work on meal planning and easy cooking plans.    Martita has agreed to follow up with our clinic in 2-3 weeks. She was informed of the importance of frequent follow up visits to maximize her success with intensive lifestyle modifications for her multiple health conditions.   OBESITY BEHAVIORAL INTERVENTION VISIT  Today's visit was # 6   Starting weight: 212 lb Starting date: 03/20/18 Today's weight : Weight: 222 lb (100.7 kg)  Today's date: 06/10/18  Total lbs lost to date: 0 At least 15 minutes were spent on discussing the following behavioral intervention visit.   ASK: We discussed the diagnosis of obesity with Meghan Diaz today and Aryana agreed to give [place] permission to discuss obesity behavioral modification therapy today.  ASSESS: Inez Catalina  has the diagnosis of obesity and her BMI today is 34.76 Inez Catalinadwena is in the action stage of change   ADVISE: Inez Catalinadwena was educated on the multiple health risks of obesity as well as the benefit of weight loss to improve her health. She was advised of the need for long term treatment and the importance of lifestyle modifications to improve her current health and to decrease her risk of future health problems.  AGREE: Multiple dietary modification options and treatment options were discussed and  Mikiah agreed to follow the recommendations documented in the above note.  ARRANGE: Inez Catalinadwena was educated on the importance of frequent visits to treat obesity as outlined per CMS and USPSTF guidelines and agreed to schedule her next follow up appointment today.  Otis PeakI, Ashleigh Haynes, am acting as transcriptionist for [employer] Leisl Spurrier, DO   I have reviewed the above documentation  for accuracy and completeness, and I agree with the above. -Corinna CapraAngel Lisette Mancebo, DO

## 2018-06-17 ENCOUNTER — Ambulatory Visit (INDEPENDENT_AMBULATORY_CARE_PROVIDER_SITE_OTHER): Payer: Medicare HMO | Admitting: Dietician

## 2018-06-17 VITALS — Ht 67.0 in | Wt 217.0 lb

## 2018-06-17 DIAGNOSIS — E8881 Metabolic syndrome: Secondary | ICD-10-CM

## 2018-06-17 DIAGNOSIS — E669 Obesity, unspecified: Secondary | ICD-10-CM | POA: Diagnosis not present

## 2018-06-17 DIAGNOSIS — Z9189 Other specified personal risk factors, not elsewhere classified: Secondary | ICD-10-CM

## 2018-06-17 DIAGNOSIS — Z6833 Body mass index (BMI) 33.0-33.9, adult: Secondary | ICD-10-CM

## 2018-06-17 NOTE — Progress Notes (Signed)
  Office: [phone]  /  Fax: [phone]     Inez Catalinadwena has a diagnosis of insulin resistance based on her elevated fasting insulin level >5. Although Shrika's blood glucose readings are still under good control, insulin resistance puts her at greater risk of metabolic syndrome and diabetes. Inez Catalinadwena is here today for diabetic risk nutrition counseling which includes her obesity treatment plan.   Her weight today is 217 lbs, a 5 pound weight loss since her last visit. She has had a weight gain of 5 lbs since beginning treatment with us. She admits today that she had not been truly dedicated to changing her eating over the past 2 months. She stated she indulged significantly since her [birth year] and through the Thanksgiving holiday. She states that she has really been focused on her meal plan since thanksgiving and she reports she is ready to stay focused. She reports she is increasing her lean proteins, with some vegetable proteins and increased vegetables over the past 2 weeks. She state using the category 2 as a guide but is making smarter choices and portion control.   Reviewed food nutrients ie protein, fats, simple and complex carbohydrates and how these affect insulin response and her weight.  Focus on  avoiding simple carbohydrates and increasing lean proteins for ongoing wt loss efforts and glucose management.   Inez Catalinadwena is on the following meal plan: increasing proteins, portion control.  Her meal plan was individualized for maximum benefit.  Also discussed at length the following behavioral modifications to help maximize success: increasing lean protein intake, decreasing simple carbohydrates, increasing vegetables, decreasing sodium intake,  decreasing eating out,  holiday eating strategies.   Inez Catalinadwena has been instructed to work up to a goal of 150 minutes of combined cardio and strengthening exercise per week for weight loss and overall health benefits. Written information was provided  and the following handouts were given: Insulin resistance handout.     OBESITY BEHAVIORAL INTERVENTION VISIT  Today's visit was # 7   Starting weight: 212 lbs Starting date: 03/20/18 Today's weight : Weight: 217 lb (98.4 kg)  Today's date: 06/17/2018 Total lbs lost to date: 0 At least 15 minutes were spent on discussing the following behavioral intervention visit.   ASK: We discussed the diagnosis of obesity with Jeani SowEdwena S Baumgarner today and Inez Catalinadwena agreed to give us permission to discuss obesity behavioral modification therapy today.  ASSESS: Inez Catalinadwena has the diagnosis of obesity and her BMI today is 33.9 Disha is in the action stage of change   ADVISE: Inez Catalinadwena was educated on the multiple health risks of obesity as well as the benefit of weight loss to improve her health. She was advised of the need for long term treatment and the importance of lifestyle modifications to improve her current health and to decrease her risk of future health problems.  AGREE: Multiple dietary modification options and treatment options were discussed and  Presli agreed to follow the recommendations documented in the above note.  ARRANGE: Inez Catalinadwena was educated on the importance of frequent visits to treat obesity as outlined per CMS and USPSTF guidelines and agreed to schedule her next follow up appointment today.

## 2018-06-26 ENCOUNTER — Ambulatory Visit (INDEPENDENT_AMBULATORY_CARE_PROVIDER_SITE_OTHER): Payer: Self-pay | Admitting: Psychology

## 2018-06-30 ENCOUNTER — Ambulatory Visit (INDEPENDENT_AMBULATORY_CARE_PROVIDER_SITE_OTHER): Payer: Self-pay | Admitting: Bariatrics

## 2018-07-14 ENCOUNTER — Ambulatory Visit (INDEPENDENT_AMBULATORY_CARE_PROVIDER_SITE_OTHER): Payer: Self-pay | Admitting: Bariatrics

## 2018-07-15 ENCOUNTER — Ambulatory Visit (INDEPENDENT_AMBULATORY_CARE_PROVIDER_SITE_OTHER): Payer: Medicare HMO | Admitting: Bariatrics

## 2018-07-15 ENCOUNTER — Encounter (INDEPENDENT_AMBULATORY_CARE_PROVIDER_SITE_OTHER): Payer: Self-pay | Admitting: Bariatrics

## 2018-07-15 VITALS — BP 160/83 | HR 62 | Temp 97.6°F | Ht 67.0 in | Wt 226.0 lb

## 2018-07-15 DIAGNOSIS — E7849 Other hyperlipidemia: Secondary | ICD-10-CM

## 2018-07-15 DIAGNOSIS — E8881 Metabolic syndrome: Secondary | ICD-10-CM | POA: Diagnosis not present

## 2018-07-15 DIAGNOSIS — I10 Essential (primary) hypertension: Secondary | ICD-10-CM

## 2018-07-15 DIAGNOSIS — F3289 Other specified depressive episodes: Secondary | ICD-10-CM | POA: Diagnosis not present

## 2018-07-15 DIAGNOSIS — Z6835 Body mass index (BMI) 35.0-35.9, adult: Secondary | ICD-10-CM | POA: Diagnosis not present

## 2018-07-15 MED ORDER — FLUOXETINE HCL 20 MG PO CAPS: 20.0000 mg | ORAL_CAPSULE | Freq: Every day | ORAL | 0 refills | Status: DC

## 2018-07-15 NOTE — Progress Notes (Signed)
Office: [phone]  /  Fax: [phone]   HPI:   Chief Complaint: OBESITY Meghan Diaz is here to discuss her progress with her obesity treatment plan. She is on the Category 2 plan and is following her eating plan approximately 0 % of the time. She states she is walking 30 to 45 minutes 2 to 3 times per week. Meghan Diaz has been struggling since her last visit. She has been eating "junk food". Her weight is 226 lb (102.5 kg) today and has had a weight loss of 9 pounds over a period of 5 weeks since her last visit. She has lost 14 lbs since starting treatment with us.  Hypertension Meghan Diaz is a 78 y.o. female with hypertension. She is taking Losartan-HCTZ. Meghan Diaz denies chest pain or shortness of breath on exertion. She is working weight loss to help control her blood pressure with the goal of decreasing her risk of heart attack and stroke. Meghan Diaz blood pressure is not well controlled.  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 Although Meghan Diaz's blood glucose readings are still under good control, insulin resistance puts her at greater risk of metabolic syndrome and diabetes. Her last A1c was at 5.6 and last insulin level was at 10 She is not taking metformin currently and continues to work on diet and exercise to decrease risk of diabetes.  Hyperlipidemia Meghan Diaz has hyperlipidemia and she is not on medications. She has been trying to control her cholesterol levels with intensive lifestyle modification including a low saturated fat diet, exercise and weight loss. She denies any chest pain, claudication or myalgias.  Depression with emotional eating behaviors Meghan Diaz has anxiety and depression and she is tearful. She struggles with emotional eating and using food for comfort to the extent that it is negatively impacting her health. She often snacks when she is not hungry. Meghan Diaz sometimes feels she is out of  control and then feels guilty that she made poor food choices. She has been working on behavior modification techniques to help reduce her emotional eating and has been somewhat successful. She shows no sign of suicidal or homicidal ideations.  Depression screen Meghan Diaz 2/9 06/10/2018 05/20/2018 05/06/2018 04/22/2018 03/20/2018  Decreased Interest 0 1 0 1 3  Down, Depressed, Hopeless 0 1 0 1 3  PHQ - 2 Score 0 2 0 2 6  Altered sleeping 0 0 1 1 0  Tired, decreased energy 0 0 0 1 1  Change in appetite 3 1 2 2 3   Feeling bad or failure about yourself  0 2 0 2 3  Trouble concentrating 0 1 0 1 0  Moving slowly or fidgety/restless 0 0 0 0 0  Suicidal thoughts 0 0 0 0 0  PHQ-9 Score 3 6 3 9 13   Difficult doing work/chores - - - - Not difficult at all     ASSESSMENT AND PLAN:  Essential hypertension  Insulin resistance  Other hyperlipidemia  Other depression - with emotional eating - Plan: Ambulatory referral to Psychiatry, FLUoxetine (PROZAC) 20 MG capsule  Class 2 severe obesity with serious comorbidity and body mass index (BMI) of 35.0 to 35.9 in adult, unspecified obesity type (HCC)  PLAN:  Hypertension We discussed sodium restriction, working on healthy weight loss, and a regular exercise program as the means to achieve improved blood pressure control. [name] agreed with this plan and agreed to follow up as directed. We will continue to monitor her blood  pressure as well as her progress with the above lifestyle modifications. She will continue her medications as prescribed. She will take her medications consistently and will watch for signs of hypotension as she continues her lifestyle modifications.  Insulin Resistance Meghan Diaz will continue to work on weight loss, exercise, increasing lean protein and decreasing simple carbohydrates in her diet to help decrease the risk of diabetes. She was informed that eating too many simple carbohydrates or too many calories at one sitting increases the  likelihood of GI side effects. [name] agreed to follow up with us as directed to monitor her progress.  Hyperlipidemia [name] was informed of the American Heart Association Guidelines emphasizing intensive lifestyle modifications as the first line treatment for hyperlipidemia. We discussed many lifestyle modifications today in depth, and Meghan Diaz will continue to work lowering her triglycerides. She will work on decreasing saturated fats such as fatty red meat, butter and many fried foods. She will also increase vegetables and lean protein in her diet and continue to work on exercise and weight loss efforts.  Depression with Emotional Eating Behaviors We discussed behavior modification techniques today to help Meghan Diaz deal with her emotional eating and depression. We discussed medications today and she has agreed to take Prozac 20 mg once daily #30 with no refills and follow up as directed. We will refer to psychiatry (ASAP). [facility]  Obesity Meghan Diaz is currently in the action stage of change. As such, her goal is to continue with weight loss efforts She has agreed to follow the Category 2 plan Meghan Diaz has been instructed to work up to a goal of 150 minutes of combined cardio and strengthening exercise per week for weight loss and overall health benefits. We discussed the following Behavioral Modification Strategies today: Do not bring snacks into the home, increase H2O intake, better snacking choices, increasing lean protein intake, decreasing simple carbohydrates , increasing vegetables, work on meal planning and easy cooking plans, emotional eating strategies and ways to avoid boredom eating  Meghan Diaz has agreed to follow up with our clinic in 4 weeks. She was informed of the importance of frequent follow up visits to maximize her success with intensive lifestyle modifications for her multiple health conditions.  ALLERGIES: No Known  Allergies  MEDICATIONS: Current Outpatient Medications on File Prior to Visit  Medication Sig Dispense Refill  . levothyroxine (SYNTHROID, LEVOTHROID) 75 MCG tablet     . losartan-hydrochlorothiazide (HYZAAR) 50-12.5 MG tablet      No current facility-administered medications on file prior to visit.     PAST MEDICAL HISTORY: Past Medical History:  Diagnosis Date  . Decreased hearing   . Depression   . Floaters in visual field   . HTN (hypertension)   . Hyperlipidemia   . Hypothyroidism   . Obesity     PAST SURGICAL HISTORY: Past Surgical History:  Procedure Laterality Date  . ANKLE SURGERY Left     SOCIAL HISTORY: Social History   Tobacco Use  . Smoking status: Never Smoker  . Smokeless tobacco: Never Used  Substance Use Topics  . Alcohol use: Not on file  . Drug use: Not on file    FAMILY HISTORY: Family History  Problem Relation Age of Onset  . Diabetes Mother   . Hypertension Mother   . Kidney disease Mother   . Heart disease Mother   . Cancer Father     ROS: Review of Systems  Constitutional: Negative for weight loss.  Respiratory: Negative for  shortness of breath (on exertion).   Cardiovascular: Negative for chest pain and claudication.  Musculoskeletal: Negative for myalgias.  Psychiatric/Behavioral: Positive for depression. Negative for suicidal ideas.    PHYSICAL EXAM: Blood pressure (!) 160/83, pulse 62, temperature 97.6 F (36.4 C), temperature source Oral, height 5\' 7"  (1.702 m), weight 226 lb (102.5 kg), SpO2 97 %. Body mass index is 35.4 kg/m. Physical Exam Vitals signs reviewed.  Constitutional:      Appearance: Normal appearance. She is well-developed. She is obese.  Cardiovascular:     Rate and Rhythm: Normal rate.  Pulmonary:     Effort: Pulmonary effort is normal.  Musculoskeletal: Normal range of motion.  Skin:    General: Skin is warm and dry.  Neurological:     Mental Status: She is alert and oriented to person, place,  and time.  Psychiatric:        Mood and Affect: Mood is anxious and depressed. Affect is tearful.        Behavior: Behavior normal.        Thought Content: Thought content does not include homicidal or suicidal ideation.     RECENT LABS AND TESTS: BMET    Component Value Date/Time   NA 137 03/20/2018 1221   K 4.2 03/20/2018 1221   CL 94 (L) 03/20/2018 1221   CO2 22 03/20/2018 1221   GLUCOSE 86 03/20/2018 1221   BUN 12 03/20/2018 1221   CREATININE 0.81 03/20/2018 1221   CALCIUM 9.7 03/20/2018 1221   GFRNONAA 72 03/20/2018 1221   GFRAA 83 03/20/2018 1221   Lab Results  Component Value Date   HGBA1C 5.6 03/20/2018   Lab Results  Component Value Date   INSULIN 10.4 03/20/2018   CBC No results found for: WBC, RBC, HGB, HCT, PLT, MCV, MCH, MCHC, RDW, LYMPHSABS, MONOABS, EOSABS, BASOSABS Iron/TIBC/Ferritin/ %Sat No results found for: IRON, TIBC, FERRITIN, IRONPCTSAT Lipid Panel  No results found for: CHOL, TRIG, HDL, CHOLHDL, VLDL, LDLCALC, LDLDIRECT Hepatic Function Panel     Component Value Date/Time   PROT 7.3 03/20/2018 1221   ALBUMIN 4.8 03/20/2018 1221   AST 18 03/20/2018 1221   ALT 12 03/20/2018 1221   ALKPHOS 75 03/20/2018 1221   BILITOT 0.6 03/20/2018 1221   No results found for: TSH    OBESITY BEHAVIORAL INTERVENTION VISIT  Today's visit was # 7   Starting weight: 212 lbs Starting date: 03/20/2018 Today's weight : 226 lbs Today's date: 07/15/2018 Total lbs lost to date: 14 At least 15 minutes were spent on discussing the following behavioral intervention visit.   ASK: We discussed the diagnosis of obesity with Meghan Sow today and Sherrin agreed to give [place] permission to discuss obesity behavioral modification therapy today.  ASSESS: Lakaya has the diagnosis of obesity and her BMI today is 35.39 Hebe is in the action stage of change   ADVISE: Tierra was educated on the multiple health risks of obesity as well as the benefit of weight loss  to improve her health. She was advised of the need for long term treatment and the importance of lifestyle modifications to improve her current health and to decrease her risk of future health problems.  AGREE: Multiple dietary modification options and treatment options were discussed and  Kamorah agreed to follow the recommendations documented in the above note.  ARRANGE: Lynzee was educated on the importance of frequent visits to treat obesity as outlined per CMS and USPSTF guidelines and agreed to schedule her next follow up  appointment today.  Cristi Loron, am acting as Energy manager for [employer]. Manson Passey, DO  I have reviewed the above documentation for accuracy and completeness, and I agree with the above. -Corinna Capra, DO

## 2018-07-21 NOTE — Progress Notes (Signed)
Office: [phone]  /  Fax: [phone]    Date: July 22, 2018   Time Seen: 9:30am Duration: 35 minutes Provider: Lawerance Cruel, Psy.D. Type of Session: Individual Therapy  Type of Contact: Face-to-face  HPI: Meghan Diaz referred by Dr. Liliane Shi seen for an initial appointment by this provider on April 22, 2018. Per the note for thelastvisit withDr. Cindy Hazy April 16, 2018,"Edwenais currently struggling and finding it "difficult to commit". She is eating more at night. She is angry with herself (tearful). She has appropriate amount of hunger and is not hungry."Regarding the prescribed meal plan, Meghan Diaz reported, "I haven't done it." She explained, "I just can't get it in my head to get on this program."In addition, per the note for the initial visit withDr. Cindy Hazy March 20, 2018,"Edwenahas struggled with obesity for years and has been unsuccessful in either losing weight or maintaining long term weight loss. She has increased her portion size and she has cravings for salty, crunchy and sweet foods. She is snacking at night."During the initial appointment, Edwenareported experiencing the following:significant food cravings issues; snacking frequently in the evening; frequently drinking liquids with calories; frequently eating larger portions than normal; binge eating behaviors; and struggling with emotional eating.Meghan Diaz Food and Mood (modified PHQ-9) score was13.Duringtheinitial appointment with this provider, Meghan Diaz reported, "I am just disgusted when I look at myself in the mirror." She described decreased self-esteem and noted, "It's like I don't love me like I love everyone else." Meghan Diaz reported her last episode of emotional eating wasthe night before the initial appointment with this provider. She explained she went out to eat with her neighbors at [employer]. She noted she consumed pecan pancakes. Later in the evening, she consumed  popcorn, beans, and toast. Meghan Diaz indicated, "I was just eating." In addition, Meghan Diaz noted anincreasein going out to eat recently. Moreover, she added, "I can usually control it during the day. It is usually the evening."Meghan Diaz denied a history of binge eating. She denied a history of purging and engagement in other compensatory strategies. Meghan Diaz denied ever being diagnosed with an eating disorder.Furthermore,Meghan Diaz asked to complete a questionnaire assessing various behaviors related to emotional eating. Edwenaendorsed the following: overeat when you are celebrating, experience food cravings on a regular basis, eat certain foods when you are anxious, stressed, depressed, or your feelings are hurt, use food to help you cope with emotional situations, find food is comforting to you, overeat when you are angry or upset, overeat when you are worried about something, overeat frequently when you are bored or lonely, overeat when you are alone, but eat much less when you are with other people and eat to help you stay awake.She further added eating as a reward.  During today's appointment, Meghan Diaz reported weight gain, but shared she has been focusing on making better choices since the last appointment with this clinic with Dr. Manson Passey on July 15, 2018.   Session Content: Session focused on the following treatment goal: decrease emotional eating. The session was initiated with the administration of the PHQ-9 and GAD-7, as well as a brief check-in. Meghan Diaz reported, "I've been eating my life away, but got a grip on it recently." This was explored further. Meghan Diaz indicated, "I just ate what I wanted to and gained a lot of weight." Per her scale at home, Meghan Diaz indicated she gained 18 pounds; however, per the note for the visit with Dr. Manson Passey on July 15, 2018, Meghan Diaz had a weight loss of 9 pounds. This was reflected to Meghan Diaz.  Regarding changes in her eating habits, Meghan Diaz shared she ceased consuming potato  chips and ice cream. This provider explored what Meghan Diaz has been consuming in the past week, and she acknowledged eating parts of the prescribed meal plan. Thus, protein intake was explored. Meghan Diaz noted a belief she is consuming the recommended protein, but indicated she is not focusing on calorie intake as it is "Too much to think about right now." Meghan Diaz further discussed engaging in portion control. The appointment also focused on thought defusion further as Meghan Diaz noted, "I just realized I needed to start loving me." The aforementioned was explored and associated thoughts and feelings were processed. Meghan Diaz was provided with a handout for an additional thought defusion exercise, "Leaves on a Stream." Meghan Diaz noted a plan to practice the exercise in the mornings on her porch. Furthermore, this provider discussed termination planning, including the option for a referral for longer-term therapeutic services. Meghan Diaz shared Meghan Diaz referred her to a psychiatrist during their last appointment Per chart review, her appointment is scheduled for August 18, 2018.  Meghan Diaz was receptive to today's session as evidenced by openness to sharing, responsiveness to feedback, and willingness to engage in thought defusion.  Mental Status Examination: Meghan Diaz arrived early for the appointment. She presented as appropriately dressed and groomed. Meghan Diaz appeared her stated age and demonstrated adequate orientation to time, place, person, and purpose of the appointment. She also demonstrated appropriate eye contact. No psychomotor abnormalities or behavioral peculiarities noted. Her mood was euthymic with congruent affect. Her thought processes were logical, linear, and goal-directed. No hallucinations, delusions, bizarre thinking or behavior reported or observed. Judgment, insight, and impulse control appeared to be grossly intact. There was no evidence of paraphasias (i.e., errors in speech, gross mispronunciations, and word  substitutions), repetition deficits, or disturbances in volume or prosody (i.e., rhythm and intonation). There was no evidence of attention or memory impairments. Trystyn denied current suicidal and homicidal ideation, intent or plan.  Structured Assessment Results: The Patient Health Questionnaire-9 (PHQ-9) is a self-report measure that assesses symptoms and severity of depression over the course of the last two weeks. Meghan Diaz obtained a score of 6 suggesting mild depression. Meghan Diaz finds the endorsed symptoms to be not difficult at all. Depression screen PHQ 2/9 07/22/2018  Decreased Interest 1  Down, Depressed, Hopeless 1  PHQ - 2 Score 2  Altered sleeping 1  Tired, decreased energy 1  Change in appetite 0  Feeling bad or failure about yourself  1  Trouble concentrating 1  Moving slowly or fidgety/restless 0  Suicidal thoughts 0  PHQ-9 Score 6  Difficult doing work/chores -   The Generalized Anxiety Disorder-7 (GAD-7) is a brief self-report measure that assesses symptoms of anxiety over the course of the last two weeks. Marenda obtained a score of 2 suggesting minimal anxiety. GAD 7 : Generalized Anxiety Score 07/22/2018  Nervous, Anxious, on Edge 1  Control/stop worrying 0  Worry too much - different things 0  Trouble relaxing 0  Restless 0  Easily annoyed or irritable 1  Afraid - awful might happen 0  Total GAD 7 Score 2  Anxiety Difficulty Not difficult at all   Interventions:  Administration of PHQ-9 and GAD-7 for symptom monitoring Review of content from the previous session Empathic reflections and validation Processing thoughts and feelings Psychoeducation regarding thought defusion Termination planning Positive reinforcement Brief chart review  DSM-5 Diagnosis: 311 (F32.8) Other Specified Depressive Disorder, Emotional Eating Behaviors  Treatment Goal & Progress: During the initial appointment with  this provider, the following treatment goal was established: decrease  emotional eating. Koty has demonstrated progress in her goal as evidenced by increased awareness of hunger patterns and triggers for emotional eating. During today's appointment, barriers to date were further processed. Shaylynn continues to demonstrate willingness to engage in learned skills.  Plan: Keven continues to appear able and willing to participate as evidenced by engagement in reciprocal conversation, and asking questions for clarification as appropriate. The next appointment will be scheduled in two weeks. The next session will focus on reviewing learned skills, and termination.

## 2018-07-22 ENCOUNTER — Ambulatory Visit (INDEPENDENT_AMBULATORY_CARE_PROVIDER_SITE_OTHER): Payer: Medicare HMO | Admitting: Psychology

## 2018-07-22 DIAGNOSIS — F3289 Other specified depressive episodes: Secondary | ICD-10-CM

## 2018-08-11 NOTE — Progress Notes (Signed)
Office: [phone]  /  Fax: [phone]    Date: August 12, 2018   Time Seen: 7:55am Duration: 24 minutes Provider: Lawerance CruelGaytri , Psy.D. Type of Session: Individual Therapy  Type of Contact: Face-to-face  Session Content: Meghan Diaz is a 78 y.o. female presenting for a follow-up appointment to address the previously established treatment goal of decreasing emotional eating. The session was initiated with the administration of the PHQ-9 and GAD-7, as well as a brief check-in. Today's appointment also focused on termination.   Meghan Diaz shared ongoing sleep difficulties, and noted she averages approximately five hours of sleep a night. Meghan Diaz explained she engages in deep breathing to help. Regarding eating, Meghan Diaz shared, "I'm doing pretty good. I joined [employer]." She was encouraged to inform Meghan Diaz during today's appointment; she agreed. She described an improvement in her mood since joining [employer], and noted she looks forward to her weekly meetings. Regarding emotional eating, Aditri described experiencing emotional hunger, but denied any episodes of emotional eating. Additionally, she discussed making better choices when experiencing hunger between meals and she also noted focusing on protein intake. Regarding the psychiatrist appointment previously scheduled based on Dr. Theora GianottiBrown's referral, Meghan CatalinaEdwena noted she canceled the appointment. However, she expressed a plan to initiate therapeutic services with another psychologist based on a recommendation from a friend. This provider explained that should she need a referral, she can request it from Meghan Diaz in the future. Meghan Diaz verbally acknowledged understanding. Remainder of session focused on deep breathing, as Meghan Diaz described it as being helpful for sleep difficulties. This provider discussed the benefit of pairing statements with each inhale (e.g., "Breathing in relaxation.") and each exhale (e.g., "Breathing out frustration.").  Meghan Diaz expressed willingness to incorporate the aforementioned into her breathing practice. Furthermore, this provider gave Meghan Diaz a handout focusing on self-compassion for her to utilize with her deep breathing. Meghan Diaz was receptive to today's session as evidenced by openness to sharing, responsiveness to feedback, and willingness to continue engaging in learned skills. Session concluded with Meghan Diaz hugging this provider and stating, "You have really helped me."   Mental Status Examination: Meghan Diaz arrived early for the appointment. She presented as appropriately dressed and groomed. Meghan Diaz appeared her stated age and demonstrated adequate orientation to time, place, person, and purpose of the appointment. She also demonstrated appropriate eye contact. No psychomotor abnormalities or behavioral peculiarities noted. Her mood was euthymic with congruent affect. Her thought processes were logical, linear, and goal-directed. No hallucinations, delusions, bizarre thinking or behavior reported or observed. Judgment, insight, and impulse control appeared to be grossly intact. There was no evidence of paraphasias (i.e., errors in speech, gross mispronunciations, and word substitutions), repetition deficits, or disturbances in volume or prosody (i.e., rhythm and intonation). There was no evidence of attention or memory impairments. Meghan Diaz denied current suicidal and homicidal ideation, plan and intent.   Structured Assessment Results: The Patient Health Questionnaire-9 (PHQ-9) is a self-report measure that assesses symptoms and severity of depression over the course of the last two weeks. Meghan Diaz obtained a score of 1 suggesting minimal depression. Meghan Diaz finds the endorsed symptoms to be not difficult at all. Depression screen  [facility] 2/[facility] 08/12/2018  Decreased Interest 0  Down, Depressed, Hopeless 0  PHQ - 2 Score 0  Altered sleeping 1  Tired, decreased energy 0  Change in appetite 0  Feeling bad or failure about  yourself  0  Trouble concentrating 0  Moving slowly or fidgety/restless 0  Suicidal thoughts 0  PHQ-9 Score 1  Difficult doing work/chores -  The Generalized Anxiety Disorder-7 (GAD-7) is a brief self-report measure that assesses symptoms of anxiety over the course of the last two weeks. Meghan Diaz obtained a score of zero. GAD 7 : Generalized Anxiety Score 08/12/2018  Nervous, Anxious, on Edge 0  Control/stop worrying 0  Worry too much - different things 0  Trouble relaxing 0  Restless 0  Easily annoyed or irritable 0  Afraid - awful might happen 0  Total GAD 7 Score 0  Anxiety Difficulty -   Interventions:  Administration of PHQ-9 and GAD-7 for symptom monitoring Empathic reflections and validation Reviewed learned skills Brief chart review  Psychoeducation regarding deep breathing  Positive reinforcement  DSM-5 Diagnosis: 311 (F32.8) Other Specified Depressive Disorder, Emotional EatingBehaviors  Treatment Goal & Progress: During the initial appointment with this provider, the following treatment goal was established: decrease emotional eating. Meghan Diaz has demonstrated progress in her goal as evidenced by increased awareness of hunger patterns and triggers for emotional eating. During today's appointment, she denied episodes of emotional eating since the last appointment with this provider despite experiencing emotional hunger. Meghan Diaz also expressed willingness to continue engaging in learned skills.   Plan: Today is Meghan Diaz's last appointment with this provider. Meghan Diaz expressed a plan to initiate therapeutic services with another psychologist based on a friend's recommendation. She acknowledged understanding that she may request a referral from Dr. Manson Passey during future appointments if needed for therapeutic services.

## 2018-08-12 ENCOUNTER — Ambulatory Visit (INDEPENDENT_AMBULATORY_CARE_PROVIDER_SITE_OTHER): Payer: Medicare HMO | Admitting: Psychology

## 2018-08-12 ENCOUNTER — Ambulatory Visit (INDEPENDENT_AMBULATORY_CARE_PROVIDER_SITE_OTHER): Payer: Medicare HMO | Admitting: Bariatrics

## 2018-08-12 ENCOUNTER — Encounter (INDEPENDENT_AMBULATORY_CARE_PROVIDER_SITE_OTHER): Payer: Self-pay | Admitting: Bariatrics

## 2018-08-12 VITALS — BP 155/77 | HR 63 | Temp 98.3°F | Ht 67.0 in | Wt 211.0 lb

## 2018-08-12 DIAGNOSIS — E8881 Metabolic syndrome: Secondary | ICD-10-CM

## 2018-08-12 DIAGNOSIS — E669 Obesity, unspecified: Secondary | ICD-10-CM | POA: Diagnosis not present

## 2018-08-12 DIAGNOSIS — E559 Vitamin D deficiency, unspecified: Secondary | ICD-10-CM | POA: Diagnosis not present

## 2018-08-12 DIAGNOSIS — Z6833 Body mass index (BMI) 33.0-33.9, adult: Secondary | ICD-10-CM

## 2018-08-12 DIAGNOSIS — F3289 Other specified depressive episodes: Secondary | ICD-10-CM

## 2018-08-12 DIAGNOSIS — I10 Essential (primary) hypertension: Secondary | ICD-10-CM | POA: Diagnosis not present

## 2018-08-12 NOTE — Progress Notes (Signed)
Office: [phone]  /  Fax: [phone]   HPI:   Chief Complaint: OBESITY Meghan Diaz is here to discuss her progress with her obesity treatment plan. She is on the Category 2 plan and is following her eating plan approximately 90 % of the time. She states she is walking 2 miles 7 times per week. Meghan Diaz has done very well overall. She is also going to [employer]. Her weight is 211 lb (95.7 kg) today and has had a weight loss of 15 pounds over a period of 4 weeks since her last visit. She has lost 1 lbs since starting treatment with us.  Hypertension Meghan Diaz is a 78 y.o. female with hypertension. Her systolic blood pressure is slightly elevated today. She is taking Losartan-HCTZ. Meghan SowEdwena S Diaz denies chest pain or shortness of breath on exertion. She is working weight loss to help control her blood pressure with the goal of decreasing her risk of heart attack and stroke. Edwenas blood pressure is reasonably well controlled.  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 Although Meghan Diaz's blood glucose readings are still under good control, insulin resistance puts her at greater risk of metabolic syndrome and diabetes. Her last A1c was at 5.6 and last insulin level was at 10.4 She is not taking medications currently and continues to work on diet and exercise to decrease risk of diabetes. Meghan Diaz denies polyphagia.  Vitamin D deficiency Meghan Diaz has a diagnosis of vitamin D deficiency. She is not currently taking vit D and denies nausea, vomiting or muscle weakness.  ASSESSMENT AND PLAN:  Essential hypertension - Plan: Lipid Panel With LDL/HDL Ratio  Insulin resistance - Plan: Comprehensive metabolic panel, Hemoglobin A1c, Insulin, random  Vitamin D deficiency - Plan: VITAMIN D 25 Hydroxy (Vit-D Deficiency, Fractures)  Class 1 obesity with serious comorbidity and body mass index (BMI) of 33.0 to 33.9 in adult,  unspecified obesity type  PLAN:  Hypertension We discussed sodium restriction, working on healthy weight loss, and a regular exercise program as the means to achieve improved blood pressure control. Meghan Diaz agreed with this plan and agreed to follow up as directed. We will continue to monitor her blood pressure as well as her progress with the above lifestyle modifications. She will continue her medications as prescribed and will watch for signs of hypotension as she continues her lifestyle modifications. We will check CMP and fasting lipid panel today.  Insulin Resistance Meghan Diaz will continue to work on weight loss, exercise, and decreasing simple carbohydrates in her diet to help decrease the risk of diabetes. She was informed that eating too many simple carbohydrates or too many calories at one sitting increases the likelihood of GI side effects. We will check Hgb A1c and insulin level today. Meghan Diaz agreed to follow up with us as directed to monitor her progress.  Vitamin D Deficiency Meghan Diaz was informed that low vitamin D levels contributes to fatigue and are associated with obesity, breast, and colon cancer. We will check vitamin D level today and she will follow up for routine testing of vitamin D, at least 2-3 times per year. She was informed of the risk of over-replacement of vitamin D and agrees to not increase her dose unless she discusses this with us first.  Obesity Meghan Diaz is currently in the action stage of change. As such, her goal is to continue with weight loss efforts She has agreed to follow the Category 2 plan Meghan Diaz has been  instructed to work up to a goal of 150 minutes of combined cardio and strengthening exercise per week for weight loss and overall health benefits. We discussed the following Behavioral Modification Strategies today: increase H2O intake, keeping healthy foods in the home, increasing lean protein intake, decreasing simple carbohydrates, increasing vegetables  and work on meal planning and intentional eating  Meghan Diaz has agreed to follow up with our clinic in 2 weeks. She was informed of the importance of frequent follow up visits to maximize her success with intensive lifestyle modifications for her multiple health conditions.  ALLERGIES: No Known Allergies  MEDICATIONS: Current Outpatient Medications on File Prior to Visit  Medication Sig Dispense Refill  . FLUoxetine (PROZAC) 20 MG capsule Take 1 capsule (20 mg total) by mouth daily. 30 capsule 0  . levothyroxine (SYNTHROID, LEVOTHROID) 75 MCG tablet     . losartan-hydrochlorothiazide (HYZAAR) 50-12.5 MG tablet      No current facility-administered medications on file prior to visit.     PAST MEDICAL HISTORY: Past Medical History:  Diagnosis Date  . Decreased hearing   . Depression   . Floaters in visual field   . HTN (hypertension)   . Hyperlipidemia   . Hypothyroidism   . Obesity     PAST SURGICAL HISTORY: Past Surgical History:  Procedure Laterality Date  . ANKLE SURGERY Left     SOCIAL HISTORY: Social History   Tobacco Use  . Smoking status: Never Smoker  . Smokeless tobacco: Never Used  Substance Use Topics  . Alcohol use: Not on file  . Drug use: Not on file    FAMILY HISTORY: Family History  Problem Relation Age of Onset  . Diabetes Mother   . Hypertension Mother   . Kidney disease Mother   . Heart disease Mother   . Cancer Father     ROS: Review of Systems  Constitutional: Positive for weight loss.  Gastrointestinal: Negative for nausea and vomiting.  Musculoskeletal:       Negative for muscle weakness  Endo/Heme/Allergies:       Negative for polyphagia    PHYSICAL EXAM: Blood pressure (!) 155/77, pulse 63, temperature 98.3 F (36.8 C), temperature source Oral, height 5\' 7"  (1.702 m), weight 211 lb (95.7 kg), SpO2 97 %. Body mass index is 33.05 kg/m. Physical Exam Vitals signs reviewed.  Constitutional:      Appearance: Normal appearance.  She is well-developed. She is obese.  Cardiovascular:     Rate and Rhythm: Normal rate.  Pulmonary:     Effort: Pulmonary effort is normal.  Musculoskeletal: Normal range of motion.  Skin:    General: Skin is warm and dry.  Neurological:     Mental Status: She is alert and oriented to person, place, and time.  Psychiatric:        Mood and Affect: Mood normal.        Behavior: Behavior normal.     RECENT LABS AND TESTS: BMET    Component Value Date/Time   NA 137 03/20/2018 1221   K 4.2 03/20/2018 1221   CL 94 (L) 03/20/2018 1221   CO2 22 03/20/2018 1221   GLUCOSE 86 03/20/2018 1221   BUN 12 03/20/2018 1221   CREATININE 0.81 03/20/2018 1221   CALCIUM 9.7 03/20/2018 1221   GFRNONAA 72 03/20/2018 1221   GFRAA 83 03/20/2018 1221   Lab Results  Component Value Date   HGBA1C 5.6 03/20/2018   Lab Results  Component Value Date   INSULIN 10.4 03/20/2018  CBC No results found for: WBC, RBC, HGB, HCT, PLT, MCV, MCH, MCHC, RDW, LYMPHSABS, MONOABS, EOSABS, BASOSABS Iron/TIBC/Ferritin/ %Sat No results found for: IRON, TIBC, FERRITIN, IRONPCTSAT Lipid Panel  No results found for: CHOL, TRIG, HDL, CHOLHDL, VLDL, LDLCALC, LDLDIRECT Hepatic Function Panel     Component Value Date/Time   PROT 7.3 03/20/2018 1221   ALBUMIN 4.8 03/20/2018 1221   AST 18 03/20/2018 1221   ALT 12 03/20/2018 1221   ALKPHOS 75 03/20/2018 1221   BILITOT 0.6 03/20/2018 1221   No results found for: TSH    OBESITY BEHAVIORAL INTERVENTION VISIT  Today's visit was # 8   Starting weight: 212 lbs Starting date: 03/20/2018 Today's weight : 211 lbs Today's date: 08/12/2018 Total lbs lost to date: 1 At least 15 minutes were spent on discussing the following behavioral intervention visit.   ASK: We discussed the diagnosis of obesity with Meghan Sow today and Ashani agreed to give [place] permission to discuss obesity behavioral modification therapy today.  ASSESS: Nechy has the diagnosis of  obesity and her BMI today is 33.04 Laretta is in the action stage of change   ADVISE: Christyann was educated on the multiple health risks of obesity as well as the benefit of weight loss to improve her health. She was advised of the need for long term treatment and the importance of lifestyle modifications to improve her current health and to decrease her risk of future health problems.  AGREE: Multiple dietary modification options and treatment options were discussed and  Dannya agreed to follow the recommendations documented in the above note.  ARRANGE: Conchetta was educated on the importance of frequent visits to treat obesity as outlined per CMS and USPSTF guidelines and agreed to schedule her next follow up appointment today.  Cristi Loron, am acting as Energy manager for [employer]. Manson Passey, DO  I have reviewed the above documentation for accuracy and completeness, and I agree with the above. -Corinna Capra, DO

## 2018-08-13 LAB — COMPREHENSIVE METABOLIC PANEL
ALK PHOS: 72 IU/L (ref 39–117)
ALT: 10 IU/L (ref 0–32)
AST: 22 IU/L (ref 0–40)
Albumin/Globulin Ratio: 2.1 (ref 1.2–2.2)
Albumin: 4.6 g/dL (ref 3.7–4.7)
BUN/Creatinine Ratio: 23 (ref 12–28)
BUN: 19 mg/dL (ref 8–27)
Bilirubin Total: 0.3 mg/dL (ref 0.0–1.2)
CALCIUM: 10 mg/dL (ref 8.7–10.3)
CO2: 24 mmol/L (ref 20–29)
CREATININE: 0.81 mg/dL (ref 0.57–1.00)
Chloride: 99 mmol/L (ref 96–106)
GFR calc Af Amer: 82 mL/min/{1.73_m2} (ref >59)
GFR, EST NON AFRICAN AMERICAN: 71 mL/min/{1.73_m2} (ref >59)
GLOBULIN, TOTAL: 2.2 g/dL (ref 1.5–4.5)
GLUCOSE: 94 mg/dL (ref 65–99)
Potassium: 5.2 mmol/L (ref 3.5–5.2)
SODIUM: 138 mmol/L (ref 134–144)
Total Protein: 6.8 g/dL (ref 6.0–8.5)

## 2018-08-13 LAB — HEMOGLOBIN A1C
ESTIMATED AVERAGE GLUCOSE: 111 mg/dL
HEMOGLOBIN A1C: 5.5 % (ref 4.8–5.6)

## 2018-08-13 LAB — LIPID PANEL WITH LDL/HDL RATIO
Cholesterol, Total: 200 mg/dL — ABNORMAL HIGH (ref 100–199)
HDL: 64 mg/dL (ref >39)
LDL Calculated: 117 mg/dL — ABNORMAL HIGH (ref 0–99)
LDl/HDL Ratio: 1.8 ratio (ref 0.0–3.2)
Triglycerides: 95 mg/dL (ref 0–149)
VLDL Cholesterol Cal: 19 mg/dL (ref 5–40)

## 2018-08-13 LAB — INSULIN, RANDOM: INSULIN: 17.5 u[IU]/mL (ref 2.6–24.9)

## 2018-08-13 LAB — VITAMIN D 25 HYDROXY (VIT D DEFICIENCY, FRACTURES): Vit D, 25-Hydroxy: 21.8 ng/mL — ABNORMAL LOW (ref 30.0–100.0)

## 2018-08-18 ENCOUNTER — Ambulatory Visit: Payer: Medicare HMO | Admitting: Psychology

## 2018-08-26 ENCOUNTER — Ambulatory Visit (INDEPENDENT_AMBULATORY_CARE_PROVIDER_SITE_OTHER): Payer: Medicare HMO | Admitting: Bariatrics

## 2018-08-26 ENCOUNTER — Encounter (INDEPENDENT_AMBULATORY_CARE_PROVIDER_SITE_OTHER): Payer: Self-pay | Admitting: Bariatrics

## 2018-08-26 VITALS — BP 148/68 | HR 62 | Temp 97.6°F | Ht 67.0 in | Wt 209.0 lb

## 2018-08-26 DIAGNOSIS — E669 Obesity, unspecified: Secondary | ICD-10-CM

## 2018-08-26 DIAGNOSIS — Z6832 Body mass index (BMI) 32.0-32.9, adult: Secondary | ICD-10-CM

## 2018-08-26 DIAGNOSIS — I10 Essential (primary) hypertension: Secondary | ICD-10-CM | POA: Diagnosis not present

## 2018-08-26 DIAGNOSIS — E559 Vitamin D deficiency, unspecified: Secondary | ICD-10-CM

## 2018-08-26 DIAGNOSIS — E8881 Metabolic syndrome: Secondary | ICD-10-CM

## 2018-08-26 MED ORDER — VITAMIN D3 1.25 MG (50000 UT) PO CAPS: 1.0000 | ORAL_CAPSULE | ORAL | 0 refills | Status: DC

## 2018-08-26 NOTE — Progress Notes (Signed)
Office: [phone]  /  Fax: [phone]   HPI:   Chief Complaint: OBESITY Meghan Diaz is here to discuss her progress with her obesity treatment plan. She is on the Category 2 plan and is following her eating plan approximately 100 % of the time. She states she is walking 2 miles 5  times per week. [name] is doing better on the plan. She is getting adequate water and protein. Her weight is 209 lb (94.8 kg) today and has had a weight loss of 2 pounds over a period of 2 weeks since her last visit. She has lost 3 lbs since starting treatment with [place].  Hypertension [name] is a 78 y.o. female with hypertension. She is taking Hyzaar. Meghan Diaz denies chest pain or shortness of breath on exertion. She is working weight loss to help control her blood pressure with the goal of decreasing her risk of heart attack and stroke. Edwenas blood pressure is reasonably well controlled.  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 Although [name]'s blood glucose readings are still under good control, insulin resistance puts her at greater risk of metabolic syndrome and diabetes. Her last A1c was at 5.5 and last insulin level was at 17.5 She is not taking medications currently and continues to work on diet and exercise to decrease risk of diabetes. Meghan Diaz has appropriate hunger.  Vitamin D deficiency Meghan Diaz has a diagnosis of vitamin D deficiency. She is not currently taking vit D and denies nausea, vomiting or muscle weakness.  ASSESSMENT AND PLAN:  Vitamin D deficiency - Plan: Cholecalciferol (VITAMIN D3) 1.25 MG (50000 UT) CAPS  Essential hypertension  Insulin resistance  Class 1 obesity with serious comorbidity and body mass index (BMI) of 32.0 to 32.9 in adult, unspecified obesity type  PLAN:  Hypertension We discussed sodium restriction, working on healthy weight loss, and a regular exercise program as the means  to achieve improved blood pressure control. Sallye agreed with this plan and agreed to follow up as directed. We will continue to monitor her blood pressure as well as her progress with the above lifestyle modifications. She will continue her medications as prescribed and will watch for signs of hypotension as she continues her lifestyle modifications.  Insulin Resistance Drisana will continue to work on weight loss, exercise, increasing lean protein and decreasing simple carbohydrates in her diet to help decrease the risk of diabetes. She was informed that eating too many simple carbohydrates or too many calories at one sitting increases the likelihood of GI side effects.  Jericka agreed to follow up with [place] as directed to monitor her progress.  Vitamin D Deficiency Meghan Diaz was informed that low vitamin D levels contributes to fatigue and are associated with obesity, breast, and colon cancer. She agrees to start prescription Vit D @50 ,000 IU every week #4 with no refills and will follow up for routine testing of vitamin D, at least 2-3 times per year. She was informed of the risk of over-replacement of vitamin D and agrees to not increase her dose unless she discusses this with [place] first. Meghan Diaz agrees to follow up as directed.  Obesity Meghan Diaz is currently in the action stage of change. As such, her goal is to continue with weight loss efforts She has agreed to follow the Category 2 plan Meghan Diaz has been instructed to work up to a goal of 150 minutes of combined cardio and strengthening exercise per week for weight  loss and overall health benefits. We discussed the following Behavioral Modification Strategies today: increase H2O intake, keeping healthy foods in the home, increasing lean protein intake, decreasing simple carbohydrates, increasing vegetables and work on meal planning and easy cooking plans Additional lunch options and breakfast options were provided today. She will avoid triggers for  crunchy, sweet foods etc.  Meghan Diaz has agreed to follow up with our clinic in 2 weeks. She was informed of the importance of frequent follow up visits to maximize her success with intensive lifestyle modifications for her multiple health conditions.  ALLERGIES: No Known Allergies  MEDICATIONS: Current Outpatient Medications on File Prior to Visit  Medication Sig Dispense Refill  . ALPRAZolam (XANAX) 0.5 MG tablet Take 0.5 mg by mouth at bedtime as needed for anxiety.    Marland Kitchen levothyroxine (SYNTHROID, LEVOTHROID) 75 MCG tablet     . losartan-hydrochlorothiazide (HYZAAR) 50-12.5 MG tablet      No current facility-administered medications on file prior to visit.     PAST MEDICAL HISTORY: Past Medical History:  Diagnosis Date  . Decreased hearing   . Depression   . Floaters in visual field   . HTN (hypertension)   . Hyperlipidemia   . Hypothyroidism   . Obesity     PAST SURGICAL HISTORY: Past Surgical History:  Procedure Laterality Date  . ANKLE SURGERY Left     SOCIAL HISTORY: Social History   Tobacco Use  . Smoking status: Never Smoker  . Smokeless tobacco: Never Used  Substance Use Topics  . Alcohol use: Not on file  . Drug use: Not on file    FAMILY HISTORY: Family History  Problem Relation Age of Onset  . Diabetes Mother   . Hypertension Mother   . Kidney disease Mother   . Heart disease Mother   . Cancer Father     ROS: Review of Systems  Constitutional: Positive for weight loss.  Respiratory: Negative for shortness of breath (on exertion).   Cardiovascular: Negative for chest pain.  Gastrointestinal: Negative for nausea and vomiting.  Musculoskeletal:       Negative for muscle weakness  Endo/Heme/Allergies:       Negative for polyphagia    PHYSICAL EXAM: Blood pressure (!) 148/68, pulse 62, temperature 97.6 F (36.4 C), temperature source Oral, height 5\' 7"  (1.702 m), weight 209 lb (94.8 kg), SpO2 98 %. Body mass index is 32.73 kg/m. Physical  Exam Vitals signs reviewed.  Constitutional:      Appearance: Normal appearance. She is well-developed. She is obese.  Cardiovascular:     Rate and Rhythm: Normal rate.  Pulmonary:     Effort: Pulmonary effort is normal.  Musculoskeletal: Normal range of motion.  Skin:    General: Skin is warm and dry.  Neurological:     Mental Status: She is alert and oriented to person, place, and time.  Psychiatric:        Mood and Affect: Mood normal.        Behavior: Behavior normal.     RECENT LABS AND TESTS: BMET    Component Value Date/Time   NA 138 08/12/2018 0955   K 5.2 08/12/2018 0955   CL 99 08/12/2018 0955   CO2 24 08/12/2018 0955   GLUCOSE 94 08/12/2018 0955   BUN 19 08/12/2018 0955   CREATININE 0.81 08/12/2018 0955   CALCIUM 10.0 08/12/2018 0955   GFRNONAA 71 08/12/2018 0955   GFRAA 82 08/12/2018 0955   Lab Results  Component Value Date   HGBA1C  5.5 08/12/2018   HGBA1C 5.6 03/20/2018   Lab Results  Component Value Date   INSULIN 17.5 08/12/2018   INSULIN 10.4 03/20/2018   CBC No results found for: WBC, RBC, HGB, HCT, PLT, MCV, MCH, MCHC, RDW, LYMPHSABS, MONOABS, EOSABS, BASOSABS Iron/TIBC/Ferritin/ %Sat No results found for: IRON, TIBC, FERRITIN, IRONPCTSAT Lipid Panel     Component Value Date/Time   CHOL 200 (H) 08/12/2018 0955   TRIG 95 08/12/2018 0955   HDL 64 08/12/2018 0955   LDLCALC 117 (H) 08/12/2018 0955   Hepatic Function Panel     Component Value Date/Time   PROT 6.8 08/12/2018 0955   ALBUMIN 4.6 08/12/2018 0955   AST 22 08/12/2018 0955   ALT 10 08/12/2018 0955   ALKPHOS 72 08/12/2018 0955   BILITOT 0.3 08/12/2018 0955   No results found for: TSH   Ref. Range 08/12/2018 09:55  Vitamin D, 25-Hydroxy Latest Ref Range: 30.0 - 100.0 ng/mL 21.8 (L)    OBESITY BEHAVIORAL INTERVENTION VISIT  Today's visit was # 9   Starting weight: 212 lbs Starting date: 03/20/2018 Today's weight : 209 lbs Today's date: 08/26/2018 Total lbs lost to date:  3 At least 15 minutes were spent on discussing the following behavioral intervention visit.   ASK: We discussed the diagnosis of obesity with Meghan SowEdwena S Matus today and Inez Catalinadwena agreed to give us permission to discuss obesity behavioral modification therapy today.  ASSESS: Inez Catalinadwena has the diagnosis of obesity and her BMI today is 32.73 Inez Catalinadwena is in the action stage of change   ADVISE: Inez Catalinadwena was educated on the multiple health risks of obesity as well as the benefit of weight loss to improve her health. She was advised of the need for long term treatment and the importance of lifestyle modifications to improve her current health and to decrease her risk of future health problems.  AGREE: Multiple dietary modification options and treatment options were discussed and  Mariaclara agreed to follow the recommendations documented in the above note.  ARRANGE: Inez Catalinadwena was educated on the importance of frequent visits to treat obesity as outlined per CMS and USPSTF guidelines and agreed to schedule her next follow up appointment today.  Cristi LoronI, Joanne Murray, am acting as Energy managertranscriptionist for [employer]. Manson PasseyBrown, DO  I have reviewed the above documentation for accuracy and completeness, and I agree with the above. -Corinna CapraAngel Brown, DO

## 2018-08-27 ENCOUNTER — Telehealth (INDEPENDENT_AMBULATORY_CARE_PROVIDER_SITE_OTHER): Payer: Self-pay | Admitting: Bariatrics

## 2018-08-27 NOTE — Telephone Encounter (Signed)
Pt called stated she was seen on 08/26/18 with Dr. Manson Passey. She was going to call in Vitamin D. The patient states the pharmacy does not have it. She uses [facility] on [address]. In [place].

## 2018-08-27 NOTE — Telephone Encounter (Signed)
Spoke with pt informed prescription is at pharmacy and ready for pick up. Verbalized understanding.

## 2018-09-11 ENCOUNTER — Ambulatory Visit (INDEPENDENT_AMBULATORY_CARE_PROVIDER_SITE_OTHER): Payer: Self-pay | Admitting: Bariatrics

## 2018-12-09 ENCOUNTER — Encounter (INDEPENDENT_AMBULATORY_CARE_PROVIDER_SITE_OTHER): Payer: Self-pay | Admitting: Bariatrics

## 2018-12-09 ENCOUNTER — Ambulatory Visit (INDEPENDENT_AMBULATORY_CARE_PROVIDER_SITE_OTHER): Payer: Medicare HMO | Admitting: Bariatrics

## 2018-12-09 ENCOUNTER — Other Ambulatory Visit: Payer: Self-pay

## 2018-12-09 DIAGNOSIS — E559 Vitamin D deficiency, unspecified: Secondary | ICD-10-CM

## 2018-12-09 DIAGNOSIS — E8881 Metabolic syndrome: Secondary | ICD-10-CM | POA: Diagnosis not present

## 2018-12-09 DIAGNOSIS — Z6832 Body mass index (BMI) 32.0-32.9, adult: Secondary | ICD-10-CM | POA: Diagnosis not present

## 2018-12-09 DIAGNOSIS — E669 Obesity, unspecified: Secondary | ICD-10-CM

## 2018-12-10 NOTE — Progress Notes (Signed)
Office: [phone]  /  Fax: [phone] TeleHealth Visit:  Meghan Diaz has verbally consented to this TeleHealth visit today. The patient is located at home, the provider is located at the [facility]. The participants in this visit include the listed provider and patient and any and all parties involved. The visit was conducted today via FaceTime.  HPI:   Chief Complaint: OBESITY Meghan Diaz is here to discuss her progress with her obesity treatment plan. She is on the Category 2 plan and is following her eating plan approximately 95 % of the time. She states she is walking 3 to 6 miles 7 times per week. Meghan Diaz states that she is doing well. She states that she has los 20 pounds since her last visit on 08/12/2018. Meghan Diaz feels like she has more control. We were unable to weigh the patient today for this TeleHealth visit. She feels as if she has lost weight since her last visit. She has lost 23 lbs since starting treatment with us.  Vitamin D deficiency Meghan Diaz has a diagnosis of vitamin D deficiency. Meghan Diaz is taking high dose vit D and she denies nausea, vomiting or muscle weakness.  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 Although Aurore's blood glucose readings are still under good control, insulin resistance puts her at greater risk of metabolic syndrome and diabetes. Her last A1c was at 5.5 and last insulin level was at 17.5 She is not taking medications currently and continues to work on diet and exercise to decrease risk of diabetes.  ASSESSMENT AND PLAN:  Vitamin D deficiency  Insulin resistance  Class 1 obesity with serious comorbidity and body mass index (BMI) of 32.0 to 32.9 in adult, unspecified obesity type  PLAN:  Vitamin D Deficiency Meghan Diaz was informed that low vitamin D levels contributes to fatigue and are associated with obesity, breast, and colon cancer. She will continue  to take prescription Vit D @50 ,000 IU every week and will follow up for routine testing of vitamin D, at least 2-3 times per year. She was informed of the risk of over-replacement of vitamin D and agrees to not increase her dose unless she discusses this with us first.  Insulin Resistance Meghan Diaz will continue to work on weight loss, exercise, increasing lean protein and decreasing simple carbohydrates in her diet to help decrease the risk of diabetes. She was informed that eating too many simple carbohydrates or too many calories at one sitting increases the likelihood of GI side effects. Camiah agreed to follow up with us as directed to monitor her progress.  Obesity Meghan Diaz is currently in the action stage of change. As such, her goal is to continue with weight loss efforts She has agreed to follow the Category 2 plan Meghan Diaz will continue her exercise regimen for weight loss and overall health benefits. We discussed the following Behavioral Modification Strategies today: increase H2O intake, no skipping meals, keeping healthy foods in the home, increasing lean protein intake, decreasing simple carbohydrates, increasing vegetables, decrease eating out and work on meal planning and easy cooking plans Meghan Diaz will weigh herself at home.  Meghan Diaz has agreed to follow up with our clinic in 2 weeks. She was informed of the importance of frequent follow up visits to maximize her success with intensive lifestyle modifications for her multiple health conditions.  ALLERGIES: No Known Allergies  MEDICATIONS: Current Outpatient Medications on File Prior to Visit  Medication Sig Dispense Refill  .  ALPRAZolam (XANAX) 0.5 MG tablet Take 0.5 mg by mouth at bedtime as needed for anxiety.    . Cholecalciferol (VITAMIN D3) 1.25 MG (50000 UT) CAPS Take 1 Dose by mouth once a week. 4 capsule 0  . levothyroxine (SYNTHROID, LEVOTHROID) 75 MCG tablet     . losartan-hydrochlorothiazide (HYZAAR) 50-12.5 MG tablet       No current facility-administered medications on file prior to visit.     PAST MEDICAL HISTORY: Past Medical History:  Diagnosis Date  . Decreased hearing   . Depression   . Floaters in visual field   . HTN (hypertension)   . Hyperlipidemia   . Hypothyroidism   . Obesity     PAST SURGICAL HISTORY: Past Surgical History:  Procedure Laterality Date  . ANKLE SURGERY Left     SOCIAL HISTORY: Social History   Tobacco Use  . Smoking status: Never Smoker  . Smokeless tobacco: Never Used  Substance Use Topics  . Alcohol use: Not on file  . Drug use: Not on file    FAMILY HISTORY: Family History  Problem Relation Age of Onset  . Diabetes Mother   . Hypertension Mother   . Kidney disease Mother   . Heart disease Mother   . Cancer Father     ROS: Review of Systems  Constitutional: Positive for weight loss.  Gastrointestinal: Negative for nausea and vomiting.  Musculoskeletal:       Negative for muscle weakness    PHYSICAL EXAM: Pt in no acute distress  RECENT LABS AND TESTS: BMET    Component Value Date/Time   NA 138 08/12/2018 0955   K 5.2 08/12/2018 0955   CL 99 08/12/2018 0955   CO2 24 08/12/2018 0955   GLUCOSE 94 08/12/2018 0955   BUN 19 08/12/2018 0955   CREATININE 0.81 08/12/2018 0955   CALCIUM 10.0 08/12/2018 0955   GFRNONAA 71 08/12/2018 0955   GFRAA 82 08/12/2018 0955   Lab Results  Component Value Date   HGBA1C 5.5 08/12/2018   HGBA1C 5.6 03/20/2018   Lab Results  Component Value Date   INSULIN 17.5 08/12/2018   INSULIN 10.4 03/20/2018   CBC No results found for: WBC, RBC, HGB, HCT, PLT, MCV, MCH, MCHC, RDW, LYMPHSABS, MONOABS, EOSABS, BASOSABS Iron/TIBC/Ferritin/ %Sat No results found for: IRON, TIBC, FERRITIN, IRONPCTSAT Lipid Panel     Component Value Date/Time   CHOL 200 (H) 08/12/2018 0955   TRIG 95 08/12/2018 0955   HDL 64 08/12/2018 0955   LDLCALC 117 (H) 08/12/2018 0955   Hepatic Function Panel     Component Value  Date/Time   PROT 6.8 08/12/2018 0955   ALBUMIN 4.6 08/12/2018 0955   AST 22 08/12/2018 0955   ALT 10 08/12/2018 0955   ALKPHOS 72 08/12/2018 0955   BILITOT 0.3 08/12/2018 0955   No results found for: TSH    Ref. Range 08/12/2018 09:55  Vitamin D, 25-Hydroxy Latest Ref Range: 30.0 - 100.0 ng/mL 21.8 (L)    I, [place] Crane, am acting as Energy manager for [employer]. Manson Passey, DO  I have reviewed the above documentation for accuracy and completeness, and I agree with the above. -Corinna Capra, DO

## 2018-12-23 ENCOUNTER — Ambulatory Visit (INDEPENDENT_AMBULATORY_CARE_PROVIDER_SITE_OTHER): Payer: Medicare HMO | Admitting: Bariatrics

## 2018-12-23 ENCOUNTER — Encounter (INDEPENDENT_AMBULATORY_CARE_PROVIDER_SITE_OTHER): Payer: Self-pay | Admitting: Bariatrics

## 2018-12-23 ENCOUNTER — Other Ambulatory Visit: Payer: Self-pay

## 2018-12-23 DIAGNOSIS — E669 Obesity, unspecified: Secondary | ICD-10-CM

## 2018-12-23 DIAGNOSIS — Z6832 Body mass index (BMI) 32.0-32.9, adult: Secondary | ICD-10-CM

## 2018-12-23 DIAGNOSIS — E119 Type 2 diabetes mellitus without complications: Secondary | ICD-10-CM

## 2018-12-23 DIAGNOSIS — E66811 Obesity, class 1: Secondary | ICD-10-CM

## 2018-12-23 DIAGNOSIS — F3289 Other specified depressive episodes: Secondary | ICD-10-CM | POA: Diagnosis not present

## 2018-12-23 DIAGNOSIS — E559 Vitamin D deficiency, unspecified: Secondary | ICD-10-CM | POA: Diagnosis not present

## 2018-12-24 NOTE — Progress Notes (Signed)
Office: [phone]  /  Fax: [phone] TeleHealth Visit:  [name] has verbally consented to this TeleHealth visit today. The patient is located at home, the provider is located at the [facility]. The participants in this visit include the listed provider and patient and any and all parties involved. The visit was conducted today via FaceTime.  HPI:   Chief Complaint: OBESITY Meghan Diaz is here to discuss her progress with her obesity treatment plan. She is on the Category 2 plan and is following her eating plan approximately 80 % of the time. She states she is walking 5 miles 7 times per week. Meghan Diaz states that she has lost 1 to 2 pounds (weight 187.5 lbs). She states that she is under more emotional stress.  We were unable to weigh the patient today for this TeleHealth visit. She feels as if she has lost weight since her last visit. She has lost 24.5 lbs since starting treatment with us.  Vitamin D deficiency Meghan Diaz has a diagnosis of vitamin D deficiency. Meghan Diaz is currently taking vit D and denies nausea, vomiting or muscle weakness.  Diabetes II Meghan Diaz has a diagnosis of diabetes type II.  Last A1c was at 6.3 and last insulin level was at 59.3 She has been working on intensive lifestyle modifications including diet, exercise, and weight loss to help control her blood glucose levels.  Depression with emotional eating behaviors Meghan Diaz is struggling with emotional eating and using food for comfort to the extent that it is negatively impacting her health. She often snacks when she is not hungry. Meghan Diaz sometimes feels she is out of control and then feels guilty that she made poor food choices. Meghan Diaz was offered depression medication and she refuses. She has been working on behavior modification techniques to help reduce her emotional eating and has been somewhat successful. She shows no sign of suicidal or homicidal ideations.  ASSESSMENT AND PLAN:  Vitamin  D deficiency  Type 2 diabetes mellitus without complication, without long-term current use of insulin (HCC)  Other depression - with emotional eating  Class 1 obesity with serious comorbidity and body mass index (BMI) of 32.0 to 32.9 in adult, unspecified obesity type  PLAN:  Vitamin D Deficiency Meghan Diaz was informed that low vitamin D levels contributes to fatigue and are associated with obesity, breast, and colon cancer. She agrees to continue to take prescription Vit D @50 ,000 IU every week and will follow up for routine testing of vitamin D, at least 2-3 times per year. She was informed of the risk of over-replacement of vitamin D and agrees to not increase her dose unless she discusses this with us first.  Diabetes II Meghan Diaz has been given extensive diabetes education by myself today including ideal fasting and post-prandial blood glucose readings, individual ideal Hgb A1c goals and hypoglycemia prevention. We discussed the importance of good blood sugar control to decrease the likelihood of diabetic complications such as nephropathy, neuropathy, limb loss, blindness, coronary artery disease, and death. We discussed the importance of intensive lifestyle modification including diet, exercise and weight loss as the first line treatment for diabetes. Meghan Diaz will continue to work on decreasing simple carbohydrates and increasing lean protein in her diet and will follow up at the agreed upon time.  Depression with Emotional Eating Behaviors We discussed behavior modification techniques today to help Meghan Diaz deal with her emotional eating and depression. She will follow up as directed. Discussed CBT techniques and distraction activities.   Obesity Meghan Diaz  is currently in the action stage of change. As such, her goal is to continue with weight loss efforts She has agreed to follow the Category 2 plan Meghan Diaz will continue her exercise regimen, stretching and arm exercises for weight loss and overall  health benefits. We discussed the following Behavioral Modification Strategies today: increase H2O intake, no skipping meals, keeping healthy foods in the home, increasing lean protein intake, decreasing simple carbohydrates, increasing vegetables, decrease eating out and work on meal planning and easy cooking plans Meghan Diaz will weigh herself at home.  Meghan Diaz has agreed to follow up with our clinic in 1 week. She was informed of the importance of frequent follow up visits to maximize her success with intensive lifestyle modifications for her multiple health conditions.  ALLERGIES: No Known Allergies  MEDICATIONS: Current Outpatient Medications on File Prior to Visit  Medication Sig Dispense Refill  . ALPRAZolam (XANAX) 0.5 MG tablet Take 0.5 mg by mouth at bedtime as needed for anxiety.    . Cholecalciferol (VITAMIN D3) 1.25 MG (50000 UT) CAPS Take 1 Dose by mouth once a week. 4 capsule 0  . levothyroxine (SYNTHROID, LEVOTHROID) 75 MCG tablet     . losartan-hydrochlorothiazide (HYZAAR) 50-12.5 MG tablet      No current facility-administered medications on file prior to visit.     PAST MEDICAL HISTORY: Past Medical History:  Diagnosis Date  . Decreased hearing   . Depression   . Floaters in visual field   . HTN (hypertension)   . Hyperlipidemia   . Hypothyroidism   . Obesity     PAST SURGICAL HISTORY: Past Surgical History:  Procedure Laterality Date  . ANKLE SURGERY Left     SOCIAL HISTORY: Social History   Tobacco Use  . Smoking status: Never Smoker  . Smokeless tobacco: Never Used  Substance Use Topics  . Alcohol use: Not on file  . Drug use: Not on file    FAMILY HISTORY: Family History  Problem Relation Age of Onset  . Diabetes Mother   . Hypertension Mother   . Kidney disease Mother   . Heart disease Mother   . Cancer Father     ROS: Review of Systems  Constitutional: Positive for weight loss.  Gastrointestinal: Negative for nausea and vomiting.   Musculoskeletal:       Negative for muscle weakness  Psychiatric/Behavioral: Positive for depression. Negative for suicidal ideas.    PHYSICAL EXAM: Pt in no acute distress  RECENT LABS AND TESTS: BMET    Component Value Date/Time   NA 138 08/12/2018 0955   K 5.2 08/12/2018 0955   CL 99 08/12/2018 0955   CO2 24 08/12/2018 0955   GLUCOSE 94 08/12/2018 0955   BUN 19 08/12/2018 0955   CREATININE 0.81 08/12/2018 0955   CALCIUM 10.0 08/12/2018 0955   GFRNONAA 71 08/12/2018 0955   GFRAA 82 08/12/2018 0955   Lab Results  Component Value Date   HGBA1C 5.5 08/12/2018   HGBA1C 5.6 03/20/2018   Lab Results  Component Value Date   INSULIN 17.5 08/12/2018   INSULIN 10.4 03/20/2018   CBC No results found for: WBC, RBC, HGB, HCT, PLT, MCV, MCH, MCHC, RDW, LYMPHSABS, MONOABS, EOSABS, BASOSABS Iron/TIBC/Ferritin/ %Sat No results found for: IRON, TIBC, FERRITIN, IRONPCTSAT Lipid Panel     Component Value Date/Time   CHOL 200 (H) 08/12/2018 0955   TRIG 95 08/12/2018 0955   HDL 64 08/12/2018 0955   LDLCALC 117 (H) 08/12/2018 0955   Hepatic Function Panel  Component Value Date/Time   PROT 6.8 08/12/2018 0955   ALBUMIN 4.6 08/12/2018 0955   AST 22 08/12/2018 0955   ALT 10 08/12/2018 0955   ALKPHOS 72 08/12/2018 0955   BILITOT 0.3 08/12/2018 0955   No results found for: TSH    Ref. Range 08/12/2018 09:55  Vitamin D, 25-Hydroxy Latest Ref Range: 30.0 - 100.0 ng/mL 21.8 (L)    I, Doreene Nest, am acting as Location manager for [employer]. Owens Shark, DO

## 2018-12-25 ENCOUNTER — Encounter (INDEPENDENT_AMBULATORY_CARE_PROVIDER_SITE_OTHER): Payer: Self-pay | Admitting: Bariatrics

## 2019-01-01 ENCOUNTER — Encounter (INDEPENDENT_AMBULATORY_CARE_PROVIDER_SITE_OTHER): Payer: Self-pay | Admitting: Bariatrics

## 2019-01-01 ENCOUNTER — Telehealth (INDEPENDENT_AMBULATORY_CARE_PROVIDER_SITE_OTHER): Payer: Medicare HMO | Admitting: Bariatrics

## 2019-01-01 ENCOUNTER — Other Ambulatory Visit: Payer: Self-pay

## 2019-01-01 DIAGNOSIS — I10 Essential (primary) hypertension: Secondary | ICD-10-CM

## 2019-01-01 DIAGNOSIS — E669 Obesity, unspecified: Secondary | ICD-10-CM | POA: Diagnosis not present

## 2019-01-01 DIAGNOSIS — Z6832 Body mass index (BMI) 32.0-32.9, adult: Secondary | ICD-10-CM | POA: Diagnosis not present

## 2019-01-01 DIAGNOSIS — R7303 Prediabetes: Secondary | ICD-10-CM | POA: Diagnosis not present

## 2019-01-05 NOTE — Progress Notes (Signed)
Office: [phone]  /  Fax: [phone] TeleHealth Visit:  Meghan Diaz has verbally consented to this TeleHealth visit today. The patient is located at home, the provider is located at the [facility] office. The participants in this visit include the listed provider and patient and any and all parties involved. The visit was conducted today via telephone. Meghan Diaz was unable to use realtime audiovisual technology today and the telehealth visit was conducted via telephone ( 16 minutes ).   HPI:   Chief Complaint: OBESITY Meghan Diaz is here to discuss her progress with her obesity treatment plan. She is on the Category 2 plan and is following her eating plan approximately 70 % of the time. She states she is walking 3 to 5 miles 7 times per week. Meghan Diaz is down 1 to 2 pounds (weight 186 lbs).  We were unable to weigh the patient today for this TeleHealth visit. She feels as if she has lost weight since her last visit. She has lost 26 lbs since starting treatment with us.  Pre-Diabetes Meghan Diaz has a diagnosis of prediabetes based on her elevated Hgb A1c and was informed this puts her at greater risk of developing diabetes. His last A1c was at 5.7 and last insulin level was at 36.1 Meghan Diaz is not on medications and she continues to work on diet and exercise to decrease risk of diabetes. She denies nausea or hypoglycemia.  Hypertension Meghan Diaz is a 78 y.o. female with hypertension. She is taking Hyzaar. Meghan Diaz denies chest pain or shortness of breath on exertion. She is working weight loss to help control her blood pressure with the goal of decreasing her risk of heart attack and stroke. Edwenas blood pressure is well controlled.  ASSESSMENT AND PLAN:  Essential hypertension  Prediabetes  Class 1 obesity with serious comorbidity and body mass index (BMI) of 32.0 to 32.9 in adult, unspecified obesity type  PLAN:  Pre-Diabetes Meghan Diaz will continue to work  on weight loss, exercise, increasing lean protein and decreasing simple carbohydrates in her diet to help decrease the risk of diabetes. She was informed that eating too many simple carbohydrates or too many calories at one sitting increases the likelihood of GI side effects. Meghan Diaz agreed to follow up with us as directed to monitor her progress.  Hypertension We discussed sodium restriction, working on healthy weight loss, and a regular exercise program as the means to achieve improved blood pressure control. Meghan Diaz agreed with this plan and agreed to follow up as directed. We will continue to monitor her blood pressure as well as her progress with the above lifestyle modifications. She will continue her medications as prescribed and will watch for signs of hypotension as she continues her lifestyle modifications.  Obesity Meghan Diaz is currently in the action stage of change. As such, her goal is to continue with weight loss efforts She has agreed to follow the Category 2 plan Meghan Diaz will continue walking for weight loss and overall health benefits. We discussed the following Behavioral Modification Strategies today: planning for success, increase H2O intake, no skipping meals, keeping healthy foods in the home, increasing lean protein intake, decreasing simple carbohydrates, increasing vegetables, decrease eating out and work on meal planning and intentional eating  Meghan Diaz has agreed to follow up with our clinic in 2 weeks. She was informed of the importance of frequent follow up visits to maximize her success with intensive lifestyle modifications for her multiple health conditions.  ALLERGIES: No Known Allergies  MEDICATIONS: Current Outpatient Medications on File Prior to Visit  Medication Sig Dispense Refill  . ALPRAZolam (XANAX) 0.5 MG tablet Take 0.5 mg by mouth at bedtime as needed for anxiety.    . Cholecalciferol (VITAMIN D3) 1.25 MG (50000 UT) CAPS Take 1 Dose by mouth once a week. 4  capsule 0  . levothyroxine (SYNTHROID, LEVOTHROID) 75 MCG tablet     . losartan-hydrochlorothiazide (HYZAAR) 50-12.5 MG tablet      No current facility-administered medications on file prior to visit.     PAST MEDICAL HISTORY: Past Medical History:  Diagnosis Date  . Decreased hearing   . Depression   . Floaters in visual field   . HTN (hypertension)   . Hyperlipidemia   . Hypothyroidism   . Obesity     PAST SURGICAL HISTORY: Past Surgical History:  Procedure Laterality Date  . ANKLE SURGERY Left     SOCIAL HISTORY: Social History   Tobacco Use  . Smoking status: Never Smoker  . Smokeless tobacco: Never Used  Substance Use Topics  . Alcohol use: Not on file  . Drug use: Not on file    FAMILY HISTORY: Family History  Problem Relation Age of Onset  . Diabetes Mother   . Hypertension Mother   . Kidney disease Mother   . Heart disease Mother   . Cancer Father     ROS: Review of Systems  Constitutional: Positive for weight loss.  Respiratory: Negative for shortness of breath (on exertion).   Cardiovascular: Negative for chest pain.  Gastrointestinal: Negative for nausea.  Endo/Heme/Allergies:       Negative for hypoglycemia    PHYSICAL EXAM: Pt in no acute distress  RECENT LABS AND TESTS: BMET    Component Value Date/Time   NA 138 08/12/2018 0955   K 5.2 08/12/2018 0955   CL 99 08/12/2018 0955   CO2 24 08/12/2018 0955   GLUCOSE 94 08/12/2018 0955   BUN 19 08/12/2018 0955   CREATININE 0.81 08/12/2018 0955   CALCIUM 10.0 08/12/2018 0955   GFRNONAA 71 08/12/2018 0955   GFRAA 82 08/12/2018 0955   Lab Results  Component Value Date   HGBA1C 5.5 08/12/2018   HGBA1C 5.6 03/20/2018   Lab Results  Component Value Date   INSULIN 17.5 08/12/2018   INSULIN 10.4 03/20/2018   CBC No results found for: WBC, RBC, HGB, HCT, PLT, MCV, MCH, MCHC, RDW, LYMPHSABS, MONOABS, EOSABS, BASOSABS Iron/TIBC/Ferritin/ %Sat No results found for: IRON, TIBC, FERRITIN,  IRONPCTSAT Lipid Panel     Component Value Date/Time   CHOL 200 (H) 08/12/2018 0955   TRIG 95 08/12/2018 0955   HDL 64 08/12/2018 0955   LDLCALC 117 (H) 08/12/2018 0955   Hepatic Function Panel     Component Value Date/Time   PROT 6.8 08/12/2018 0955   ALBUMIN 4.6 08/12/2018 0955   AST 22 08/12/2018 0955   ALT 10 08/12/2018 0955   ALKPHOS 72 08/12/2018 0955   BILITOT 0.3 08/12/2018 0955   No results found for: TSH    Ref. Range 08/12/2018 09:55  Vitamin D, 25-Hydroxy Latest Ref Range: 30.0 - 100.0 ng/mL 21.8 (L)    I, Doreene Nest, am acting as Location manager for [employer]. Owens Shark, DO  I have reviewed the above documentation for accuracy and completeness, and I agree with the above. -Jearld Lesch, DO

## 2019-01-06 ENCOUNTER — Telehealth (INDEPENDENT_AMBULATORY_CARE_PROVIDER_SITE_OTHER): Payer: Medicare HMO | Admitting: Bariatrics

## 2019-01-21 ENCOUNTER — Encounter (INDEPENDENT_AMBULATORY_CARE_PROVIDER_SITE_OTHER): Payer: Self-pay | Admitting: Bariatrics

## 2019-01-21 ENCOUNTER — Ambulatory Visit (INDEPENDENT_AMBULATORY_CARE_PROVIDER_SITE_OTHER): Payer: Medicare HMO | Admitting: Bariatrics

## 2019-01-21 ENCOUNTER — Other Ambulatory Visit: Payer: Self-pay

## 2019-01-21 VITALS — BP 142/85 | HR 63 | Temp 98.0°F | Ht 67.0 in | Wt 183.0 lb

## 2019-01-21 DIAGNOSIS — Z6830 Body mass index (BMI) 30.0-30.9, adult: Secondary | ICD-10-CM | POA: Diagnosis not present

## 2019-01-21 DIAGNOSIS — E559 Vitamin D deficiency, unspecified: Secondary | ICD-10-CM | POA: Diagnosis not present

## 2019-01-21 DIAGNOSIS — E8881 Metabolic syndrome: Secondary | ICD-10-CM

## 2019-01-21 DIAGNOSIS — I10 Essential (primary) hypertension: Secondary | ICD-10-CM | POA: Diagnosis not present

## 2019-01-21 DIAGNOSIS — E669 Obesity, unspecified: Secondary | ICD-10-CM

## 2019-01-21 MED ORDER — VITAMIN D3 1.25 MG (50000 UT) PO CAPS: 1.0000 | ORAL_CAPSULE | ORAL | 0 refills | Status: DC

## 2019-01-21 NOTE — Progress Notes (Signed)
Office: [phone]  /  Fax: [phone]   HPI:   Chief Complaint: OBESITY Meghan Diaz is here to discuss her progress with her obesity treatment plan. She is on the  follow the Category 2 plan and is following her eating plan approximately 75 % of the time. She states she is walking 3 to 5 miles 7 times per week. Meghan Diaz is down twenty six pounds since her last in-office visit. Her weight is 183 lb (83 kg) today and has had a weight loss of 26 pounds since her last in-office visit (08/26/18). She has lost 29 lbs since starting treatment with Meghan Diaz.  Hypertension Meghan Diaz is a 78 y.o. female with hypertension.  Meghan Diaz denies chest pain or shortness of breath on exertion. She is working weight loss to help control her blood pressure with the goal of decreasing her risk of heart attack and stroke. Meghan Diaz blood pressure is currently controlled.  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 Although Meghan Diaz'Diaz blood glucose readings are still under good control, insulin resistance puts her at greater risk of metabolic syndrome and diabetes. Her last A1c was at 5.5 and last insulin level was at 17.5 She is not taking metformin currently and continues to work on diet and exercise to decrease risk of diabetes.  Vitamin D deficiency Meghan Diaz has a diagnosis of vitamin D deficiency. Her last vitamin D level was at 21.8 She is currently taking vit D and denies nausea, vomiting or muscle weakness.  ASSESSMENT AND PLAN:  Essential hypertension  Insulin resistance  Vitamin D deficiency - Plan: Cholecalciferol (VITAMIN D3) 1.25 MG (50000 UT) CAPS  Class 1 obesity with serious comorbidity and body mass index (BMI) of 30.0 to 30.9 in adult, unspecified obesity type - Starting BMI greater then 30  PLAN:  Hypertension We discussed sodium restriction, working on healthy weight loss, and a regular exercise program as the means  to achieve improved blood pressure control. Meghan Diaz agreed with this plan and agreed to follow up as directed. We will continue to monitor her blood pressure as well as her progress with the above lifestyle modifications. She will continue her medications as prescribed and will watch for signs of hypotension as she continues her lifestyle modifications.  Insulin Resistance Meghan Diaz will continue to work on weight loss, exercise, increasing lean protein, increasing lean protein and decreasing simple carbohydrates in her diet to help decrease the risk of diabetes. She was informed that eating too many simple carbohydrates or too many calories at one sitting increases the likelihood of GI side effects. Meghan Diaz agreed to follow up with Meghan Diaz as directed to monitor her progress.  Vitamin D Deficiency Meghan Diaz was informed that low vitamin D levels contributes to fatigue and are associated with obesity, breast, and colon cancer. She agrees to take prescription Vit D @50 ,000 IU every week #4 with no refills and will follow up for routine testing of vitamin D, at least 2-3 times per year. She was informed of the risk of over-replacement of vitamin D and agrees to not increase her dose unless she discusses this with Meghan Diaz first. Meghan Diaz agrees to follow up as directed.  Obesity Meghan Diaz is currently in the action stage of change. As such, her goal is to continue with weight loss efforts She has agreed to follow the Category 2 plan Meghan Diaz has been instructed to work up to a goal of 150 minutes of combined cardio and strengthening exercise  per week for weight loss and overall health benefits. We discussed the following Behavioral Modification Strategies today: planning for success, increase H2O intake, no skipping meals, keeping healthy foods in the home, increasing lean protein intake, decreasing simple carbohydrates, increasing vegetables, decrease eating out and work on meal planning and intentional eating  Meghan Diaz has  agreed to follow up with our clinic in 2 weeks. She was informed of the importance of frequent follow up visits to maximize her success with intensive lifestyle modifications for her multiple health conditions.  ALLERGIES: No Known Allergies  MEDICATIONS: Current Outpatient Medications on File Prior to Visit  Medication Sig Dispense Refill  . ALPRAZolam (XANAX) 0.5 MG tablet Take 0.5 mg by mouth at bedtime as needed for anxiety.    Marland Kitchen levothyroxine (SYNTHROID, LEVOTHROID) 75 MCG tablet     . losartan-hydrochlorothiazide (HYZAAR) 50-12.5 MG tablet      No current facility-administered medications on file prior to visit.     PAST MEDICAL HISTORY: Past Medical History:  Diagnosis Date  . Decreased hearing   . Depression   . Floaters in visual field   . HTN (hypertension)   . Hyperlipidemia   . Hypothyroidism   . Obesity     PAST SURGICAL HISTORY: Past Surgical History:  Procedure Laterality Date  . ANKLE SURGERY Left     SOCIAL HISTORY: Social History   Tobacco Use  . Smoking status: Never Smoker  . Smokeless tobacco: Never Used  Substance Use Topics  . Alcohol use: Not on file  . Drug use: Not on file    FAMILY HISTORY: Family History  Problem Relation Age of Onset  . Diabetes Mother   . Hypertension Mother   . Kidney disease Mother   . Heart disease Mother   . Cancer Father     ROS: Review of Systems  Constitutional: Positive for weight loss.  Respiratory: Negative for shortness of breath (on exertion).   Cardiovascular: Negative for chest pain.  Gastrointestinal: Negative for nausea and vomiting.  Musculoskeletal:       Negative for muscle weakness    PHYSICAL EXAM: Blood pressure (!) 142/85, pulse 63, temperature 98 F (36.7 C), temperature source Oral, height 5\' 7"  (1.702 m), weight 183 lb (83 kg), SpO2 99 %. Body mass index is 28.66 kg/m. Physical Exam Vitals signs reviewed.  Constitutional:      Appearance: Normal appearance. She is  well-developed. She is obese.  Cardiovascular:     Rate and Rhythm: Normal rate.  Pulmonary:     Effort: Pulmonary effort is normal.  Musculoskeletal: Normal range of motion.  Skin:    General: Skin is warm and dry.  Neurological:     Mental Status: She is alert and oriented to person, place, and time.  Psychiatric:        Mood and Affect: Mood normal.        Behavior: Behavior normal.     RECENT LABS AND TESTS: BMET    Component Value Date/Time   NA 138 08/12/2018 0955   K 5.2 08/12/2018 0955   CL 99 08/12/2018 0955   CO2 24 08/12/2018 0955   GLUCOSE 94 08/12/2018 0955   BUN 19 08/12/2018 0955   CREATININE 0.81 08/12/2018 0955   CALCIUM 10.0 08/12/2018 0955   GFRNONAA 71 08/12/2018 0955   GFRAA 82 08/12/2018 0955   Lab Results  Component Value Date   HGBA1C 5.5 08/12/2018   HGBA1C 5.6 03/20/2018   Lab Results  Component Value Date  INSULIN 17.5 08/12/2018   INSULIN 10.4 03/20/2018   CBC No results found for: WBC, RBC, HGB, HCT, PLT, MCV, MCH, MCHC, RDW, LYMPHSABS, MONOABS, EOSABS, BASOSABS Iron/TIBC/Ferritin/ %Sat No results found for: IRON, TIBC, FERRITIN, IRONPCTSAT Lipid Panel     Component Value Date/Time   CHOL 200 (H) 08/12/2018 0955   TRIG 95 08/12/2018 0955   HDL 64 08/12/2018 0955   LDLCALC 117 (H) 08/12/2018 0955   Hepatic Function Panel     Component Value Date/Time   PROT 6.8 08/12/2018 0955   ALBUMIN 4.6 08/12/2018 0955   AST 22 08/12/2018 0955   ALT 10 08/12/2018 0955   ALKPHOS 72 08/12/2018 0955   BILITOT 0.3 08/12/2018 0955   No results found for: TSH  Results for Meghan SowROBBINS, Meghan Diaz (MRN [id number]) as of 01/21/2019 14:51  Ref. Range 08/12/2018 09:55  Vitamin D, 25-Hydroxy Latest Ref Range: 30.0 - 100.0 ng/mL 21.8 (L)    OBESITY BEHAVIORAL INTERVENTION VISIT  Today'Diaz visit was # 12   Starting weight: 212 lbs Starting date: 03/20/2018 Today'Diaz weight : 183 lbs Today'Diaz date: 01/21/2019 Total lbs lost to date: 29    01/21/2019   Height 5\' 7"  (1.702 m)  Weight 183 lb (83 kg)  BMI (Calculated) 28.66  BLOOD PRESSURE - SYSTOLIC 142  BLOOD PRESSURE - DIASTOLIC 85   Body Fat % 41.5 %  Total Body Water (lbs) 73.4 lbs    ASK: We discussed the diagnosis of obesity with Meghan SowEdwena Diaz Dilling today and Windy agreed to give Meghan Diaz permission to discuss obesity behavioral modification therapy today.  ASSESS: Meghan Diaz has the diagnosis of obesity and her BMI today is 28.66 Meghan Diaz is in the action stage of change   ADVISE: Meghan Diaz was educated on the multiple health risks of obesity as well as the benefit of weight loss to improve her health. She was advised of the need for long term treatment and the importance of lifestyle modifications to improve her current health and to decrease her risk of future health problems.  AGREE: Multiple dietary modification options and treatment options were discussed and  Meghan Diaz agreed to follow the recommendations documented in the above note.  ARRANGE: Meghan Diaz was educated on the importance of frequent visits to treat obesity as outlined per CMS and USPSTF guidelines and agreed to schedule her next follow up appointment today.  Cristi LoronI, Joanne Murray, am acting as Energy managertranscriptionist for [employer]. Manson Passey, DO  I have reviewed the above documentation for accuracy and completeness, and I agree with the above. -Corinna CapraAngel , DO

## 2019-02-05 ENCOUNTER — Encounter (INDEPENDENT_AMBULATORY_CARE_PROVIDER_SITE_OTHER): Payer: Self-pay | Admitting: Bariatrics

## 2019-02-05 ENCOUNTER — Ambulatory Visit (INDEPENDENT_AMBULATORY_CARE_PROVIDER_SITE_OTHER): Payer: Medicare HMO | Admitting: Bariatrics

## 2019-02-05 ENCOUNTER — Other Ambulatory Visit: Payer: Self-pay

## 2019-02-05 VITALS — BP 147/75 | HR 68 | Temp 98.4°F | Ht 67.0 in | Wt 182.4 lb

## 2019-02-05 DIAGNOSIS — E559 Vitamin D deficiency, unspecified: Secondary | ICD-10-CM | POA: Diagnosis not present

## 2019-02-05 DIAGNOSIS — E669 Obesity, unspecified: Secondary | ICD-10-CM | POA: Diagnosis not present

## 2019-02-05 DIAGNOSIS — E8881 Metabolic syndrome: Secondary | ICD-10-CM

## 2019-02-05 DIAGNOSIS — Z6830 Body mass index (BMI) 30.0-30.9, adult: Secondary | ICD-10-CM | POA: Diagnosis not present

## 2019-02-05 NOTE — Progress Notes (Signed)
Office: [phone]  /  Fax: [phone]   HPI:   Chief Complaint: OBESITY Meghan Diaz is here to discuss her progress with her obesity treatment plan. She is on the Category 2 plan and is following her eating plan approximately 98% of the time. She states she is walking 5 miles 7 times per week. Meghan Diaz is down 1 lb. She is drinking adequate water and is getting good protein. Her goal is a 3 lb weight loss. Her weight is 182 lbs today and has had a weight loss of 1 pound over a period of 2 weeks since her last visit. She has lost 30 lbs since starting treatment with us.  Vitamin D deficiency Meghan Diaz has a diagnosis of Vitamin D deficiency. Her last Vitamin D was 21.8 on 08/12/2018. She is not taking Vit D and denies nausea, vomiting or muscle weakness.  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 Her last A1c was 5.5 on 08/12/2018 and her insulin was 17.5. Although Meghan Diaz'Diaz blood glucose readings are still under good control, insulin resistance puts her at greater risk of metabolic syndrome and diabetes. She is not taking metformin currently and continues to work on diet and exercise to decrease risk of diabetes.  ASSESSMENT AND PLAN:  Vitamin D deficiency - Plan: VITAMIN D 25 Hydroxy (Vit-D Deficiency, Fractures)  Insulin resistance - Plan: Hemoglobin A1c, Insulin, random, Comprehensive metabolic panel  Class 1 obesity with serious comorbidity and body mass index (BMI) of 30.0 to 30.9 in adult, unspecified obesity type - Starting BMI greater then 30  PLAN:  Vitamin D Deficiency Meghan Diaz was informed that low Vitamin D levels contributes to fatigue and are associated with obesity, breast, and colon cancer. She will have routine testing of Vitamin D today. Meghan Diaz agrees to follow-up with our clinic in 2 weeks.  Insulin Resistance Meghan Diaz will continue to work on weight loss, exercise, and decreasing simple carbohydrates in  her diet to help decrease the risk of diabetes. We dicussed metformin including benefits and risks. She was informed that eating too many simple carbohydrates or too many calories at one sitting increases the likelihood of GI side effects. Meghan Diaz will decrease carbohydrates and increase protein. She will have HbA1c and insulin checked today.  Obesity Meghan Diaz is currently in the action stage of change. As such, her goal is to continue with weight loss efforts. She has agreed to follow the Category 2 plan. Meghan Diaz will work on meal planning, intentional eating, decreasing salt and popcorn and increasing raw vegetables.  Meghan Diaz has been instructed to continue walking for weight loss and overall health benefits. We discussed the following Behavioral Modification Strategies today: increasing lean protein intake, decreasing simple carbohydrates, increasing vegetables, increase H20 intake, decrease eating out, no skipping meals, work on meal planning and easy cooking plans, keeping healthy foods in the home, and planning for success.  Meghan Diaz has agreed to follow-up with our clinic in 2 weeks. She was informed of the importance of frequent follow-up visits to maximize her success with intensive lifestyle modifications for her multiple health conditions.  ALLERGIES: No Known Allergies  MEDICATIONS: Current Outpatient Medications on File Prior to Visit  Medication Sig Dispense Refill  . ALPRAZolam (XANAX) 0.5 MG tablet Take 0.5 mg by mouth at bedtime as needed for anxiety.    . Cholecalciferol (VITAMIN D3) 1.25 MG (50000 UT) CAPS Take 1 Dose by mouth once a week. 4 capsule 0  . levothyroxine (SYNTHROID, LEVOTHROID) 75  MCG tablet     . losartan-hydrochlorothiazide (HYZAAR) 50-12.5 MG tablet      No current facility-administered medications on file prior to visit.     PAST MEDICAL HISTORY: Past Medical History:  Diagnosis Date  . Decreased hearing   . Depression   . Floaters in visual field   . HTN  (hypertension)   . Hyperlipidemia   . Hypothyroidism   . Obesity     PAST SURGICAL HISTORY: Past Surgical History:  Procedure Laterality Date  . ANKLE SURGERY Left     SOCIAL HISTORY: Social History   Tobacco Use  . Smoking status: Never Smoker  . Smokeless tobacco: Never Used  Substance Use Topics  . Alcohol use: Not on file  . Drug use: Not on file    FAMILY HISTORY: Family History  Problem Relation Age of Onset  . Diabetes Mother   . Hypertension Mother   . Kidney disease Mother   . Heart disease Mother   . Cancer Father    ROS: Review of Systems  Gastrointestinal: Negative for nausea and vomiting.  Musculoskeletal:       Negative for muscle weakness.   PHYSICAL EXAM: Blood pressure (!) 147/75, pulse 68, temperature 98.4 F (36.9 C), temperature source Oral, SpO2 98 %. There is no height or weight on file to calculate BMI. Physical Exam Vitals signs reviewed.  Constitutional:      Appearance: Normal appearance. She is obese.  Cardiovascular:     Rate and Rhythm: Normal rate.     Pulses: Normal pulses.  Pulmonary:     Effort: Pulmonary effort is normal.     Breath sounds: Normal breath sounds.  Musculoskeletal: Normal range of motion.  Skin:    General: Skin is warm and dry.  Neurological:     Mental Status: She is alert and oriented to person, place, and time.  Psychiatric:        Behavior: Behavior normal.   RECENT LABS AND TESTS: BMET    Component Value Date/Time   NA 138 08/12/2018 0955   K 5.2 08/12/2018 0955   CL 99 08/12/2018 0955   CO2 24 08/12/2018 0955   GLUCOSE 94 08/12/2018 0955   BUN 19 08/12/2018 0955   CREATININE 0.81 08/12/2018 0955   CALCIUM 10.0 08/12/2018 0955   GFRNONAA 71 08/12/2018 0955   GFRAA 82 08/12/2018 0955   Lab Results  Component Value Date   HGBA1C 5.5 08/12/2018   HGBA1C 5.6 03/20/2018   Lab Results  Component Value Date   INSULIN 17.5 08/12/2018   INSULIN 10.4 03/20/2018   CBC No results found  for: WBC, RBC, HGB, HCT, PLT, MCV, MCH, MCHC, RDW, LYMPHSABS, MONOABS, EOSABS, BASOSABS Iron/TIBC/Ferritin/ %Sat No results found for: IRON, TIBC, FERRITIN, IRONPCTSAT Lipid Panel     Component Value Date/Time   CHOL 200 (H) 08/12/2018 0955   TRIG 95 08/12/2018 0955   HDL 64 08/12/2018 0955   LDLCALC 117 (H) 08/12/2018 0955   Hepatic Function Panel     Component Value Date/Time   PROT 6.8 08/12/2018 0955   ALBUMIN 4.6 08/12/2018 0955   AST 22 08/12/2018 0955   ALT 10 08/12/2018 0955   ALKPHOS 72 08/12/2018 0955   BILITOT 0.3 08/12/2018 0955   No results found for: TSH   Results for Meghan Diaz, Meghan Diaz (MRN [id number]) as of 02/05/2019 12:37  Ref. Range 08/12/2018 09:55  Vitamin D, 25-Hydroxy Latest Ref Range: 30.0 - 100.0 ng/mL 21.8 (L)   OBESITY BEHAVIORAL INTERVENTION VISIT  Today'Diaz visit was #14  Starting weight: 212 lbs Starting date: 03/20/2018 Today'Diaz weight: 182 lbs  Today'Diaz date: 02/05/2019 Total lbs lost to date: 30 At least 15 minutes were spent on discussing the following behavioral intervention visit.   Weight: 182 lbs BMI: 28.6.  02/05/2019  BLOOD PRESSURE - SYSTOLIC 562  BLOOD PRESSURE - DIASTOLIC 75   ASK: We discussed the diagnosis of obesity with Meghan Diaz today and Meghan Diaz agreed to give [place] permission to discuss obesity behavioral modification therapy today.  ASSESS: Meghan Diaz has the diagnosis of obesity and her BMI today is . Meghan Diaz is in the action stage of change.   ADVISE: Meghan Diaz was educated on the multiple health risks of obesity as well as the benefit of weight loss to improve her health. She was advised of the need for long term treatment and the importance of lifestyle modifications to improve her current health and to decrease her risk of future health problems.  AGREE: Multiple dietary modification options and treatment options were discussed and  Meghan Diaz agreed to follow the recommendations documented in the above note.  ARRANGE:  Meghan Diaz was educated on the importance of frequent visits to treat obesity as outlined per CMS and USPSTF guidelines and agreed to schedule her next follow up appointment today.  Meghan Diaz, am acting as Location manager for [employer], DO   I have reviewed the above documentation for accuracy and completeness, and I agree with the above. -Meghan Lesch, DO

## 2019-02-06 LAB — COMPREHENSIVE METABOLIC PANEL
ALT: 12 IU/L (ref 0–32)
AST: 18 IU/L (ref 0–40)
Albumin/Globulin Ratio: 2.5 — ABNORMAL HIGH (ref 1.2–2.2)
Albumin: 4.8 g/dL — ABNORMAL HIGH (ref 3.7–4.7)
Alkaline Phosphatase: 75 IU/L (ref 39–117)
BUN/Creatinine Ratio: 18 (ref 12–28)
BUN: 15 mg/dL (ref 8–27)
Bilirubin Total: 0.5 mg/dL (ref 0.0–1.2)
CO2: 24 mmol/L (ref 20–29)
Calcium: 10.1 mg/dL (ref 8.7–10.3)
Chloride: 103 mmol/L (ref 96–106)
Creatinine, Ser: 0.83 mg/dL (ref 0.57–1.00)
GFR calc Af Amer: 80 mL/min/{1.73_m2} (ref >59)
GFR calc non Af Amer: 69 mL/min/{1.73_m2} (ref >59)
Globulin, Total: 1.9 g/dL (ref 1.5–4.5)
Glucose: 88 mg/dL (ref 65–99)
Potassium: 5.4 mmol/L — ABNORMAL HIGH (ref 3.5–5.2)
Sodium: 143 mmol/L (ref 134–144)
Total Protein: 6.7 g/dL (ref 6.0–8.5)

## 2019-02-06 LAB — HEMOGLOBIN A1C
Est. average glucose Bld gHb Est-mCnc: 111 mg/dL
Hgb A1c MFr Bld: 5.5 % (ref 4.8–5.6)

## 2019-02-06 LAB — INSULIN, RANDOM: INSULIN: 6.8 u[IU]/mL (ref 2.6–24.9)

## 2019-02-06 LAB — VITAMIN D 25 HYDROXY (VIT D DEFICIENCY, FRACTURES): Vit D, 25-Hydroxy: 35.6 ng/mL (ref 30.0–100.0)

## 2019-02-19 ENCOUNTER — Other Ambulatory Visit: Payer: Self-pay

## 2019-02-19 ENCOUNTER — Encounter (INDEPENDENT_AMBULATORY_CARE_PROVIDER_SITE_OTHER): Payer: Self-pay | Admitting: Bariatrics

## 2019-02-19 ENCOUNTER — Ambulatory Visit (INDEPENDENT_AMBULATORY_CARE_PROVIDER_SITE_OTHER): Payer: Medicare HMO | Admitting: Bariatrics

## 2019-02-19 ENCOUNTER — Telehealth (INDEPENDENT_AMBULATORY_CARE_PROVIDER_SITE_OTHER): Payer: Self-pay

## 2019-02-19 VITALS — BP 156/76 | HR 61 | Temp 97.5°F | Ht 67.0 in | Wt 182.0 lb

## 2019-02-19 DIAGNOSIS — F3289 Other specified depressive episodes: Secondary | ICD-10-CM | POA: Diagnosis not present

## 2019-02-19 DIAGNOSIS — E8881 Metabolic syndrome: Secondary | ICD-10-CM | POA: Diagnosis not present

## 2019-02-19 DIAGNOSIS — E559 Vitamin D deficiency, unspecified: Secondary | ICD-10-CM

## 2019-02-19 DIAGNOSIS — Z6830 Body mass index (BMI) 30.0-30.9, adult: Secondary | ICD-10-CM

## 2019-02-19 DIAGNOSIS — I10 Essential (primary) hypertension: Secondary | ICD-10-CM

## 2019-02-19 DIAGNOSIS — E669 Obesity, unspecified: Secondary | ICD-10-CM | POA: Diagnosis not present

## 2019-02-19 NOTE — Telephone Encounter (Signed)
Patient called stated she had checked with [facility] and the RX for Wellbutrin has not been called in. Please let patient know status.  [phone]

## 2019-02-23 ENCOUNTER — Other Ambulatory Visit (INDEPENDENT_AMBULATORY_CARE_PROVIDER_SITE_OTHER): Payer: Self-pay | Admitting: Bariatrics

## 2019-02-23 MED ORDER — BUPROPION HCL ER (SR) 150 MG PO TB12: 150.0000 mg | ORAL_TABLET | Freq: Every day | ORAL | 0 refills | Status: DC

## 2019-02-23 NOTE — Telephone Encounter (Signed)
FYI, I will send if okay

## 2019-02-23 NOTE — Telephone Encounter (Signed)
Will send, thank you 

## 2019-02-24 NOTE — Progress Notes (Signed)
Office: [phone]  /  Fax: [phone]   HPI:   Chief Complaint: OBESITY Meghan Diaz is here to discuss her progress with her obesity treatment plan. She is on the Category 2 plan and is following her eating plan approximately 25 % of the time. She states she is walking 3 to 5 1/2 miles 7 times per week. Meghan Diaz is stable today and she has "cheated slightly" on certain foods. Meghan Diaz has been craving bread and carbohydrates. Her weight is 182 lb (82.6 kg) today and she has maintained weight since her last visit. She has lost 30 lbs since starting treatment with us.  Hypertension Meghan Diaz is a 78 y.o. female with hypertension. Meghan SowEdwena S Pizano denies chest pain or shortness of breath on exertion. She is working weight loss to help control her blood pressure with the goal of decreasing her risk of heart attack and stroke. Edwenas blood pressure is reasonably well controlled.  Vitamin D deficiency Meghan Diaz has a diagnosis of vitamin D deficiency. Her last vitamin D level was at 35.6 She is currently taking vit D and denies nausea, vomiting or muscle weakness.  Insulin Resistance (mild) Meghan Diaz has a diagnosis of insulin resistance based on her elevated fasting insulin level >5. Although Linsy's blood glucose readings are still under good control, insulin resistance puts her at greater risk of metabolic syndrome and diabetes. Her last A1c was at 5.5 and last insulin level was at 6.8 She is not taking metformin currently and continues to work on diet and exercise to decrease risk of diabetes.  Depression with emotional eating behaviors Meghan Diaz has increased anxiety and she is doing some emotional eating with bread, more at night. Meghan Diaz struggles with emotional eating and using food for comfort to the extent that it is negatively impacting her health. She often snacks when she is not hungry. Meghan Diaz sometimes feels she is out of control and then feels guilty that she made poor food choices. She  has been working on behavior modification techniques to help reduce her emotional eating and has been somewhat successful. She shows no sign of suicidal or homicidal ideations.  ASSESSMENT AND PLAN:  Essential hypertension  Vitamin D deficiency  Insulin resistance  Other depression - Plan: buPROPion (WELLBUTRIN SR) 150 MG 12 hr tablet  Class 1 obesity with serious comorbidity and body mass index (BMI) of 30.0 to 30.9 in adult, unspecified obesity type  PLAN:  Hypertension Jeorgia will decrease her sodium intake and she will use "tasty table" salt substitution. We discussed sodium restriction, working on healthy weight loss, and a regular exercise program as the means to achieve improved blood pressure control. Kiari agreed with this plan and agreed to follow up as directed. Meghan Diaz will continue exercise. We will continue to monitor her blood pressure as well as her progress with the above lifestyle modifications. She will continue her medications as prescribed and will watch for signs of hypotension as she continues her lifestyle modifications.  Vitamin D Deficiency Meghan Diaz was informed that low vitamin D levels contributes to fatigue and are associated with obesity, breast, and colon cancer. She will continue to take prescription Vit D @50 ,000 IU every week and will follow up for routine testing of vitamin D, at least 2-3 times per year. She was informed of the risk of over-replacement of vitamin D and agrees to not increase her dose unless she discusses this with us first.  Insulin Resistance (mild) Draven will continue to work on weight loss, exercise, increasing lean protein  and decreasing simple carbohydrates in her diet to help decrease the risk of diabetes. She was informed that eating too many simple carbohydrates or too many calories at one sitting increases the likelihood of GI side effects.  Meilani agreed to follow up with [place] as directed to monitor her progress.  Depression with  Emotional Eating Behaviors We discussed behavior modification techniques today to help Teran deal with her emotional eating and depression. She has agreed to take Wellbutrin SR 150 mg daily #30 with no refills and follow up as directed.  Obesity Meghan Diaz is currently in the action stage of change. As such, her goal is to continue with weight loss efforts She has agreed to follow the Category 2 plan Meghan Diaz will continue her exercise regimen for weight loss and overall health benefits. We discussed the following Behavioral Modification Strategies today: increase H2O intake, no skipping meals, keeping healthy foods in the home, increasing lean protein intake, decreasing simple carbohydrates, increasing vegetables, decrease eating out and work on meal planning and intentional eating  Meghan Diaz has agreed to follow up with our clinic in 2 weeks. She was informed of the importance of frequent follow up visits to maximize her success with intensive lifestyle modifications for her multiple health conditions.  ALLERGIES: No Known Allergies  MEDICATIONS: Current Outpatient Medications on File Prior to Visit  Medication Sig Dispense Refill  . ALPRAZolam (XANAX) 0.5 MG tablet Take 0.5 mg by mouth at bedtime as needed for anxiety.    . Cholecalciferol (VITAMIN D3) 1.25 MG (50000 UT) CAPS Take 1 Dose by mouth once a week. 4 capsule 0  . levothyroxine (SYNTHROID, LEVOTHROID) 75 MCG tablet     . losartan-hydrochlorothiazide (HYZAAR) 50-12.5 MG tablet      No current facility-administered medications on file prior to visit.     PAST MEDICAL HISTORY: Past Medical History:  Diagnosis Date  . Decreased hearing   . Depression   . Floaters in visual field   . HTN (hypertension)   . Hyperlipidemia   . Hypothyroidism   . Obesity     PAST SURGICAL HISTORY: Past Surgical History:  Procedure Laterality Date  . ANKLE SURGERY Left     SOCIAL HISTORY: Social History   Tobacco Use  . Smoking status:  Never Smoker  . Smokeless tobacco: Never Used  Substance Use Topics  . Alcohol use: Not on file  . Drug use: Not on file    FAMILY HISTORY: Family History  Problem Relation Age of Onset  . Diabetes Mother   . Hypertension Mother   . Kidney disease Mother   . Heart disease Mother   . Cancer Father     ROS: Review of Systems  Constitutional: Negative for weight loss.  Cardiovascular: Negative for chest pain.  Gastrointestinal: Negative for nausea and vomiting.  Musculoskeletal:       Negative for muscle weakness  Psychiatric/Behavioral: Positive for depression. Negative for suicidal ideas.    PHYSICAL EXAM: Blood pressure (!) 156/76, pulse 61, temperature (!) 97.5 F (36.4 C), temperature source Oral, height 5\' 7"  (1.702 m), weight 182 lb (82.6 kg), SpO2 100 %. Body mass index is 28.51 kg/m. Physical Exam Vitals signs reviewed.  Constitutional:      Appearance: Normal appearance. She is well-developed. She is obese.  Cardiovascular:     Rate and Rhythm: Normal rate.  Pulmonary:     Effort: Pulmonary effort is normal.  Musculoskeletal: Normal range of motion.  Skin:    General: Skin is warm and dry.  Neurological:     Mental Status: She is alert and oriented to person, place, and time.  Psychiatric:        Mood and Affect: Mood normal.        Behavior: Behavior normal.        Thought Content: Thought content does not include homicidal or suicidal ideation.     RECENT LABS AND TESTS: BMET    Component Value Date/Time   NA 143 02/05/2019 1136   K 5.4 (H) 02/05/2019 1136   CL 103 02/05/2019 1136   CO2 24 02/05/2019 1136   GLUCOSE 88 02/05/2019 1136   BUN 15 02/05/2019 1136   CREATININE 0.83 02/05/2019 1136   CALCIUM 10.1 02/05/2019 1136   GFRNONAA 69 02/05/2019 1136   GFRAA 80 02/05/2019 1136   Lab Results  Component Value Date   HGBA1C 5.5 02/05/2019   HGBA1C 5.5 08/12/2018   HGBA1C 5.6 03/20/2018   Lab Results  Component Value Date   INSULIN 6.8  02/05/2019   INSULIN 17.5 08/12/2018   INSULIN 10.4 03/20/2018   CBC No results found for: WBC, RBC, HGB, HCT, PLT, MCV, MCH, MCHC, RDW, LYMPHSABS, MONOABS, EOSABS, BASOSABS Iron/TIBC/Ferritin/ %Sat No results found for: IRON, TIBC, FERRITIN, IRONPCTSAT Lipid Panel     Component Value Date/Time   CHOL 200 (H) 08/12/2018 0955   TRIG 95 08/12/2018 0955   HDL 64 08/12/2018 0955   LDLCALC 117 (H) 08/12/2018 0955   Hepatic Function Panel     Component Value Date/Time   PROT 6.7 02/05/2019 1136   ALBUMIN 4.8 (H) 02/05/2019 1136   AST 18 02/05/2019 1136   ALT 12 02/05/2019 1136   ALKPHOS 75 02/05/2019 1136   BILITOT 0.5 02/05/2019 1136   No results found for: TSH    Ref. Range 02/05/2019 11:36  Vitamin D, 25-Hydroxy Latest Ref Range: 30.0 - 100.0 ng/mL 35.6    OBESITY BEHAVIORAL INTERVENTION VISIT  Today's visit was # 15   Starting weight: 212 lbs Starting date: 03/20/2018 Today's weight : 182 lbs Today's date: 02/19/2019 Total lbs lost to date: 30    02/19/2019  Height 5\' 7"  (1.702 m)  Weight 182 lb (82.6 kg)  BMI (Calculated) 28.5  BLOOD PRESSURE - SYSTOLIC 156  BLOOD PRESSURE - DIASTOLIC 76   Body Fat % 38.6 %  Total Body Water (lbs) 73.6 lbs    ASK: We discussed the diagnosis of obesity with Meghan SowEdwena S Sugrue today and Nalayah agreed to give us permission to discuss obesity behavioral modification therapy today.  ASSESS: Meghan Diaz has the diagnosis of obesity and her BMI today is 28.5 Hurley is in the action stage of change   ADVISE: Meghan Diaz was educated on the multiple health risks of obesity as well as the benefit of weight loss to improve her health. She was advised of the need for long term treatment and the importance of lifestyle modifications to improve her current health and to decrease her risk of future health problems.  AGREE: Multiple dietary modification options and treatment options were discussed and  Arlo agreed to follow the recommendations  documented in the above note.  ARRANGE: Meghan Diaz was educated on the importance of frequent visits to treat obesity as outlined per CMS and USPSTF guidelines and agreed to schedule her next follow up appointment today.  Cristi LoronI, Joanne Murray, am acting as Energy managertranscriptionist for [employer]el A. Manson PasseyBrown, DO   I have reviewed the above documentation for accuracy and completeness, and I agree with the above. -Corinna CapraAngel Hermon Zea, DO

## 2019-02-28 ENCOUNTER — Encounter (INDEPENDENT_AMBULATORY_CARE_PROVIDER_SITE_OTHER): Payer: Self-pay | Admitting: Bariatrics

## 2019-03-05 ENCOUNTER — Ambulatory Visit (INDEPENDENT_AMBULATORY_CARE_PROVIDER_SITE_OTHER): Payer: Medicare HMO | Admitting: Bariatrics

## 2019-03-10 ENCOUNTER — Encounter (INDEPENDENT_AMBULATORY_CARE_PROVIDER_SITE_OTHER): Payer: Self-pay | Admitting: Bariatrics

## 2019-03-10 ENCOUNTER — Other Ambulatory Visit: Payer: Self-pay

## 2019-03-10 ENCOUNTER — Ambulatory Visit (INDEPENDENT_AMBULATORY_CARE_PROVIDER_SITE_OTHER): Payer: Medicare HMO | Admitting: Bariatrics

## 2019-03-10 VITALS — BP 129/73 | HR 59 | Temp 97.8°F | Ht 67.0 in | Wt 178.0 lb

## 2019-03-10 DIAGNOSIS — E669 Obesity, unspecified: Secondary | ICD-10-CM

## 2019-03-10 DIAGNOSIS — F3289 Other specified depressive episodes: Secondary | ICD-10-CM

## 2019-03-10 DIAGNOSIS — Z6830 Body mass index (BMI) 30.0-30.9, adult: Secondary | ICD-10-CM

## 2019-03-10 DIAGNOSIS — E559 Vitamin D deficiency, unspecified: Secondary | ICD-10-CM | POA: Diagnosis not present

## 2019-03-10 MED ORDER — BUPROPION HCL ER (SR) 150 MG PO TB12: 150.0000 mg | ORAL_TABLET | Freq: Every day | ORAL | 0 refills | Status: DC

## 2019-03-10 NOTE — Progress Notes (Signed)
Office: [phone]  /  Fax: [phone]   HPI:   Chief Complaint: OBESITY Meghan Diaz is here to discuss her progress with her obesity treatment plan. She is on the  follow the Category 2 plan and is following her eating plan approximately 85-90% of the time. She states she is walking 3.5-5 miles 7 times per week. Meghan Diaz is down 4 lbs since her last visit 3 weeks ago. Her weight is 178 lb (80.7 kg) today and has had a weight loss of 4 pounds over a period of 3 weeks since her last visit. She has lost 34 lbs since starting treatment with us.  Depression with emotional eating behaviors Meghan Diaz is struggling with emotional eating and using food for comfort to the extent that it is negatively impacting her health. She often snacks when she is not hungry. [name] sometimes feels she is out of control and then feels guilty that she made poor food choices. She has been working on behavior modification techniques to help reduce her emotional eating and has been somewhat successful. Alianis had nausea initially and cravings have decreased 50% on Wellbutrin. She shows no sign of suicidal or homicidal ideations.  [facility]/12/2018 07/22/2018 06/10/2018 05/20/2018 05/06/2018  Decreased Interest 0 1 0 1 0  Down, Depressed, Hopeless 0 1 0 1 0  PHQ - 2 Score 0 2 0 2 0  Altered sleeping 1 1 0 0 1  Tired, decreased energy 0 1 0 0 0  Change in appetite 0 0 3 1 2   Feeling bad or failure about yourself  0 1 0 2 0  Trouble concentrating 0 1 0 1 0  Moving slowly or fidgety/restless 0 0 0 0 0  Suicidal thoughts 0 0 0 0 0  PHQ-9 Score 1 6 3 6 3   Difficult doing work/chores - - - - -   Vitamin D deficiency Tykeria has a diagnosis of Vitamin D deficiency. Last Vitamin D 35.6 on 02/05/2019. She is currently taking Vit D and denies nausea, vomiting or muscle weakness.  ASSESSMENT AND PLAN:  Vitamin D deficiency  Other depression - with emotional eating  - Plan: buPROPion (WELLBUTRIN SR) 150 MG 12  hr tablet  Class 1 obesity with serious comorbidity and body mass index (BMI) of 30.0 to 30.9 in adult, unspecified obesity type - BMI greater than 30 at start of program   PLAN:  Depression with Emotional Eating Behaviors We discussed behavior modification techniques today to help Meghan Diaz deal with her emotional eating and depression. Meghan Diaz was given a prescription for Wellbutrin 150 mg 1 daily #90 with 0 refills and agrees to follow-up with our clinic in 2 weeks.  Vitamin D Deficiency Meghan Diaz was informed that low Vitamin D levels contributes to fatigue and are associated with obesity, breast, and colon cancer. She agrees to continue taking Vit D and will follow-up for routine testing of Vitamin D, at least 2-3 times per year. She was informed of the risk of over-replacement of Vitamin D and agrees to not increase her dose unless she discusses this with us first. Meghan Diaz agrees to follow-up with our clinic in 2 weeks.  Obesity Meghan Diaz is currently in the action stage of change. As such, her goal is to continue with weight loss efforts. She has agreed to follow the Category 2 plan. Meghan Diaz will work on meal planning and intentional eating. Meghan Diaz has been instructed to work up to a goal of 150 minutes of combined cardio and strengthening exercise per  week for weight loss and overall health benefits. We discussed the following Behavioral Modification Strategies today: increasing lean protein intake, decreasing simple carbohydrates, increasing vegetables, increase H20 intake, decrease eating out, no skipping meals, work on meal planning and easy cooking plans, and keeping healthy foods in the home.  [name] has agreed to follow-up with our clinic in 2 weeks. She was informed of the importance of frequent follow-up visits to maximize her success with intensive lifestyle modifications for her multiple health conditions.  ALLERGIES: No Known Allergies  MEDICATIONS: Current Outpatient Medications on  File Prior to Visit  Medication Sig Dispense Refill  . ALPRAZolam (XANAX) 0.5 MG tablet Take 0.5 mg by mouth at bedtime as needed for anxiety.    . Cholecalciferol (VITAMIN D3) 1.25 MG (50000 UT) CAPS Take 1 Dose by mouth once a week. 4 capsule 0  . levothyroxine (SYNTHROID, LEVOTHROID) 75 MCG tablet     . losartan-hydrochlorothiazide (HYZAAR) 50-12.5 MG tablet      No current facility-administered medications on file prior to visit.     PAST MEDICAL HISTORY: Past Medical History:  Diagnosis Date  . Decreased hearing   . Depression   . Floaters in visual field   . HTN (hypertension)   . Hyperlipidemia   . Hypothyroidism   . Obesity     PAST SURGICAL HISTORY: Past Surgical History:  Procedure Laterality Date  . ANKLE SURGERY Left     SOCIAL HISTORY: Social History   Tobacco Use  . Smoking status: Never Smoker  . Smokeless tobacco: Never Used  Substance Use Topics  . Alcohol use: Not on file  . Drug use: Not on file    FAMILY HISTORY: Family History  Problem Relation Age of Onset  . Diabetes Mother   . Hypertension Mother   . Kidney disease Mother   . Heart disease Mother   . Cancer Father    ROS: Review of Systems  Gastrointestinal: Negative for nausea and vomiting.  Musculoskeletal:       Negative for muscle weakness.  Psychiatric/Behavioral: Positive for depression (emotional eating). Negative for suicidal ideas.       Negative for homicidal ideas.   PHYSICAL EXAM: Blood pressure 129/73, pulse (!) 59, temperature 97.8 F (36.6 C), temperature source Oral, height 5\' 7"  (1.702 m), weight 178 lb (80.7 kg), SpO2 99 %. Body mass index is 27.88 kg/m. Physical Exam Vitals signs reviewed.  Constitutional:      Appearance: Normal appearance. She is obese.  Cardiovascular:     Rate and Rhythm: Normal rate.     Pulses: Normal pulses.  Pulmonary:     Effort: Pulmonary effort is normal.     Breath sounds: Normal breath sounds.  Musculoskeletal: Normal range  of motion.  Skin:    General: Skin is warm and dry.  Neurological:     Mental Status: She is alert and oriented to person, place, and time.  Psychiatric:        Behavior: Behavior normal.   RECENT LABS AND TESTS: BMET    Component Value Date/Time   NA 143 02/05/2019 1136   K 5.4 (H) 02/05/2019 1136   CL 103 02/05/2019 1136   CO2 24 02/05/2019 1136   GLUCOSE 88 02/05/2019 1136   BUN 15 02/05/2019 1136   CREATININE 0.83 02/05/2019 1136   CALCIUM 10.1 02/05/2019 1136   GFRNONAA 69 02/05/2019 1136   GFRAA 80 02/05/2019 1136   Lab Results  Component Value Date   HGBA1C 5.5 02/05/2019  HGBA1C 5.5 08/12/2018   HGBA1C 5.6 03/20/2018   Lab Results  Component Value Date   INSULIN 6.8 02/05/2019   INSULIN 17.5 08/12/2018   INSULIN 10.4 03/20/2018   CBC No results found for: WBC, RBC, HGB, HCT, PLT, MCV, MCH, MCHC, RDW, LYMPHSABS, MONOABS, EOSABS, BASOSABS Iron/TIBC/Ferritin/ %Sat No results found for: IRON, TIBC, FERRITIN, IRONPCTSAT Lipid Panel     Component Value Date/Time   CHOL 200 (H) 08/12/2018 0955   TRIG 95 08/12/2018 0955   HDL 64 08/12/2018 0955   LDLCALC 117 (H) 08/12/2018 0955   Hepatic Function Panel     Component Value Date/Time   PROT 6.7 02/05/2019 1136   ALBUMIN 4.8 (H) 02/05/2019 1136   AST 18 02/05/2019 1136   ALT 12 02/05/2019 1136   ALKPHOS 75 02/05/2019 1136   BILITOT 0.5 02/05/2019 1136   No results found for: TSH  Results for ALTAMESE, DEGUIRE (MRN [id number]) as of 03/10/2019 16:20  Ref. Range 02/05/2019 11:36  Vitamin D, 25-Hydroxy Latest Ref Range: 30.0 - 100.0 ng/mL 35.6   OBESITY BEHAVIORAL INTERVENTION VISIT  Today's visit was #16  Starting weight: 212 lbs Starting date: 03/20/2018 Today's weight: 178 lbs  Today's date: 03/10/2019 Total lbs lost to date: 34 At least 15 minutes were spent on discussing the following behavioral intervention visit.    03/10/2019  Height 5\' 7"  (1.702 m)  Weight 178 lb (80.7 kg)  BMI (Calculated)  27.87  BLOOD PRESSURE - SYSTOLIC 858  BLOOD PRESSURE - DIASTOLIC 73   Body Fat % 85.0 %  Total Body Water (lbs) 73.8 lbs   ASK: We discussed the diagnosis of obesity with Lamar Benes today and Kearstin agreed to give [place] permission to discuss obesity behavioral modification therapy today.  ASSESS: Deanda has the diagnosis of obesity and her BMI today is 28.0. Shelli is in the action stage of change.   ADVISE: Rasheida was educated on the multiple health risks of obesity as well as the benefit of weight loss to improve her health. She was advised of the need for long term treatment and the importance of lifestyle modifications to improve her current health and to decrease her risk of future health problems.  AGREE: Multiple dietary modification options and treatment options were discussed and  Caly agreed to follow the recommendations documented in the above note.  ARRANGE: Sola was educated on the importance of frequent visits to treat obesity as outlined per CMS and USPSTF guidelines and agreed to schedule her next follow up appointment today.  Migdalia Dk, am acting as Location manager for [employer], DO  I have reviewed the above documentation for accuracy and completeness, and I agree with the above. -Jearld Lesch, DO

## 2019-03-11 ENCOUNTER — Other Ambulatory Visit (INDEPENDENT_AMBULATORY_CARE_PROVIDER_SITE_OTHER): Payer: Self-pay

## 2019-03-11 ENCOUNTER — Encounter (INDEPENDENT_AMBULATORY_CARE_PROVIDER_SITE_OTHER): Payer: Self-pay | Admitting: Bariatrics

## 2019-03-11 DIAGNOSIS — F3289 Other specified depressive episodes: Secondary | ICD-10-CM

## 2019-03-11 MED ORDER — BUPROPION HCL ER (SR) 150 MG PO TB12: 150.0000 mg | ORAL_TABLET | Freq: Every day | ORAL | 0 refills | Status: DC

## 2019-03-11 NOTE — Telephone Encounter (Signed)
Please review

## 2019-04-01 ENCOUNTER — Encounter (INDEPENDENT_AMBULATORY_CARE_PROVIDER_SITE_OTHER): Payer: Self-pay | Admitting: Bariatrics

## 2019-04-01 ENCOUNTER — Other Ambulatory Visit: Payer: Self-pay

## 2019-04-01 ENCOUNTER — Ambulatory Visit (INDEPENDENT_AMBULATORY_CARE_PROVIDER_SITE_OTHER): Payer: Medicare HMO | Admitting: Bariatrics

## 2019-04-01 VITALS — BP 134/78 | HR 59 | Temp 97.6°F | Ht 67.0 in | Wt 177.0 lb

## 2019-04-01 DIAGNOSIS — I10 Essential (primary) hypertension: Secondary | ICD-10-CM | POA: Diagnosis not present

## 2019-04-01 DIAGNOSIS — E669 Obesity, unspecified: Secondary | ICD-10-CM

## 2019-04-01 DIAGNOSIS — F3289 Other specified depressive episodes: Secondary | ICD-10-CM

## 2019-04-01 DIAGNOSIS — Z6830 Body mass index (BMI) 30.0-30.9, adult: Secondary | ICD-10-CM | POA: Diagnosis not present

## 2019-04-02 ENCOUNTER — Encounter (INDEPENDENT_AMBULATORY_CARE_PROVIDER_SITE_OTHER): Payer: Self-pay | Admitting: Bariatrics

## 2019-04-02 NOTE — Progress Notes (Signed)
Office: [phone]  /  Fax: [phone]   HPI:   Chief Complaint: OBESITY Meghan Diaz is here to discuss her progress with her obesity treatment plan. She is on the Category 2 plan and is following her eating plan approximately 50% of the time. She states she is walking 3-5 miles 7 times per week. Meghan Diaz is down 1 lb and doing well.  Her weight is 177 lb (80.3 kg) today and has had a weight loss of 1 pound over a period of 3 weeks since her last visit. She has lost 35 lbs since starting treatment with [place].  Depression, Other Meghan Diaz is struggling with emotional eating and using food for comfort to the extent that it is negatively impacting her health. She often snacks when she is not hungry. Meghan Diaz sometimes feels she is out of control and then feels guilty that she made poor food choices. She has been working on behavior modification techniques to help reduce her emotional eating and has been somewhat successful. Meghan Diaz is taking taking Wellbutrin and reports decreased stress eating. She shows no sign of suicidal or homicidal ideations.  Depression screen [facility] 2/9 08/12/2018 07/22/2018 06/10/2018 05/20/2018 05/06/2018  Decreased Interest 0 1 0 1 0  Down, Depressed, Hopeless 0 1 0 1 0  PHQ - 2 Score 0 2 0 2 0  Altered sleeping 1 1 0 0 1  Tired, decreased energy 0 1 0 0 0  Change in appetite 0 0 3 1 2   Feeling bad or failure about yourself  0 1 0 2 0  Trouble concentrating 0 1 0 1 0  Moving slowly or fidgety/restless 0 0 0 0 0  Suicidal thoughts 0 0 0 0 0  PHQ-9 Score 1 6 3 6 3   Difficult doing work/chores - - - - -   Hypertension Meghan Diaz is a 78 y.o. female with hypertension and is on losartan and HCTZ.  Meghan Diaz denies chest pain or shortness of breath on exertion. She is working weight loss to help control her blood pressure with the goal of decreasing her risk of heart attack and stroke. Meghan Diaz'Diaz blood pressure is well controlled.  ASSESSMENT AND PLAN:  Essential  hypertension  Other depression - with emotional eating   Class 1 obesity with serious comorbidity and body mass index (BMI) of 30.0 to 30.9 in adult, unspecified obesity type - BMI was greater than 30 at start of program   PLAN:  Depression, Other We discussed behavior modification techniques today to help Meghan Diaz deal with her emotional eating and depression. Syona will continue Wellbutrin and will follow-up as directed.  Hypertension We discussed sodium restriction, working on healthy weight loss, and a regular exercise program as the means to achieve improved blood pressure control. Meghan Diaz agreed with this plan and agreed to follow up as directed. We will continue to monitor her blood pressure as well as her progress with the above lifestyle modifications. She will continue her medications as prescribed and will watch for signs of hypotension as she continues her lifestyle modifications.  Obesity Meghan Diaz is currently in the action stage of change. As such, her goal is to continue with weight loss efforts. She has agreed to follow the Category 2 plan. Meghan Diaz will work on meal planning and intentional eating. Meghan Diaz has been instructed to continue walking every day for weight loss and overall health benefits. We discussed the following Behavioral Modification Strategies today: increasing lean protein intake, decreasing simple carbohydrates, increasing vegetables, increase H20 intake,  decrease eating out, no skipping meals, work on meal planning and easy cooking plans, and keeping healthy foods in the home.  Meghan Diaz has agreed to follow-up with our clinic in 2 weeks. She was informed of the importance of frequent follow-up visits to maximize her success with intensive lifestyle modifications for her multiple health conditions.  ALLERGIES: No Known Allergies  MEDICATIONS: Current Outpatient Medications on File Prior to Visit  Medication Sig Dispense Refill   ALPRAZolam (XANAX) 0.5 MG  tablet Take 0.5 mg by mouth at bedtime as needed for anxiety.     buPROPion (WELLBUTRIN SR) 150 MG 12 hr tablet Take 1 tablet (150 mg total) by mouth daily. 90 tablet 0   Cholecalciferol (VITAMIN D3) 1.25 MG (50000 UT) CAPS Take 1 Dose by mouth once a week. 4 capsule 0   levothyroxine (SYNTHROID, LEVOTHROID) 75 MCG tablet      losartan-hydrochlorothiazide (HYZAAR) 50-12.5 MG tablet      No current facility-administered medications on file prior to visit.     PAST MEDICAL HISTORY: Past Medical History:  Diagnosis Date   Decreased hearing    Depression    Floaters in visual field    HTN (hypertension)    Hyperlipidemia    Hypothyroidism    Obesity     PAST SURGICAL HISTORY: Past Surgical History:  Procedure Laterality Date   ANKLE SURGERY Left     SOCIAL HISTORY: Social History   Tobacco Use   Smoking status: Never Smoker   Smokeless tobacco: Never Used  Substance Use Topics   Alcohol use: Not on file   Drug use: Not on file    FAMILY HISTORY: Family History  Problem Relation Age of Onset   Diabetes Mother    Hypertension Mother    Kidney disease Mother    Heart disease Mother    Cancer Father    ROS: Review of Systems  Respiratory: Negative for shortness of breath.   Cardiovascular: Negative for chest pain.  Psychiatric/Behavioral: Positive for depression. Negative for suicidal ideas.       Negative for homicidal ideas.   PHYSICAL EXAM: Blood pressure 134/78, pulse (!) 59, temperature 97.6 F (36.4 C), temperature source Oral, height 5\' 7"  (1.702 m), weight 177 lb (80.3 kg), SpO2 99 %. Body mass index is 27.72 kg/m. Physical Exam Vitals signs reviewed.  Constitutional:      Appearance: Normal appearance. She is obese.  Cardiovascular:     Rate and Rhythm: Normal rate.     Pulses: Normal pulses.  Pulmonary:     Effort: Pulmonary effort is normal.     Breath sounds: Normal breath sounds.  Musculoskeletal: Normal range of motion.    Skin:    General: Skin is warm and dry.  Neurological:     Mental Status: She is alert and oriented to person, place, and time.  Psychiatric:        Behavior: Behavior normal.   RECENT LABS AND TESTS: BMET    Component Value Date/Time   NA 143 02/05/2019 1136   K 5.4 (H) 02/05/2019 1136   CL 103 02/05/2019 1136   CO2 24 02/05/2019 1136   GLUCOSE 88 02/05/2019 1136   BUN 15 02/05/2019 1136   CREATININE 0.83 02/05/2019 1136   CALCIUM 10.1 02/05/2019 1136   GFRNONAA 69 02/05/2019 1136   GFRAA 80 02/05/2019 1136   Lab Results  Component Value Date   HGBA1C 5.5 02/05/2019   HGBA1C 5.5 08/12/2018   HGBA1C 5.6 03/20/2018   Lab Results  Component Value Date   INSULIN 6.8 02/05/2019   INSULIN 17.5 08/12/2018   INSULIN 10.4 03/20/2018   CBC No results found for: WBC, RBC, HGB, HCT, PLT, MCV, MCH, MCHC, RDW, LYMPHSABS, MONOABS, EOSABS, BASOSABS Iron/TIBC/Ferritin/ %Sat No results found for: IRON, TIBC, FERRITIN, IRONPCTSAT Lipid Panel     Component Value Date/Time   CHOL 200 (H) 08/12/2018 0955   TRIG 95 08/12/2018 0955   HDL 64 08/12/2018 0955   LDLCALC 117 (H) 08/12/2018 0955   Hepatic Function Panel     Component Value Date/Time   PROT 6.7 02/05/2019 1136   ALBUMIN 4.8 (H) 02/05/2019 1136   AST 18 02/05/2019 1136   ALT 12 02/05/2019 1136   ALKPHOS 75 02/05/2019 1136   BILITOT 0.5 02/05/2019 1136   No results found for: TSH  Results for Meghan Diaz, Meghan Diaz (MRN [id number]) as of 04/02/2019 06:59  Ref. Range 02/05/2019 11:36  Vitamin D, 25-Hydroxy Latest Ref Range: 30.0 - 100.0 ng/mL 35.6   OBESITY BEHAVIORAL INTERVENTION VISIT  Today'Diaz visit was #17  Starting weight: 212 lbs Starting date: 03/20/2018 Today'Diaz weight: 177 lbs  Today'Diaz date: 04/01/2019 Total lbs lost to date: 35 At least 15 minutes were spent on discussing the following behavioral intervention visit.    04/01/2019  Height 5\' 7"  (1.702 m)  Weight 177 lb (80.3 kg)  BMI (Calculated) 27.72   BLOOD PRESSURE - SYSTOLIC 134  BLOOD PRESSURE - DIASTOLIC 78   Body Fat % 40.2 %  Total Body Water (lbs) 72 lbs   ASK: We discussed the diagnosis of obesity with Meghan SowEdwena Diaz Diaz today and Meghan Diaz agreed to give us permission to discuss obesity behavioral modification therapy today.  ASSESS: Meghan Diaz has the diagnosis of obesity and her BMI today is 27.8. Meghan Diaz is in the action stage of change.   ADVISE: Meghan Diaz was educated on the multiple health risks of obesity as well as the benefit of weight loss to improve her health. She was advised of the need for long term treatment and the importance of lifestyle modifications to improve her current health and to decrease her risk of future health problems.  AGREE: Multiple dietary modification options and treatment options were discussed and  Meghan Diaz agreed to follow the recommendations documented in the above note.  ARRANGE: Meghan Diaz was educated on the importance of frequent visits to treat obesity as outlined per CMS and USPSTF guidelines and agreed to schedule her next follow up appointment today.  Meghan Diaz, Denise Haag, am acting as Energy managertranscriptionist for [employer] Brown, DO  I have reviewed the above documentation for accuracy and completeness, and I agree with the above. -Corinna CapraAngel Brown, DO

## 2019-04-15 ENCOUNTER — Ambulatory Visit (INDEPENDENT_AMBULATORY_CARE_PROVIDER_SITE_OTHER): Payer: Medicare HMO | Admitting: Bariatrics

## 2019-04-15 ENCOUNTER — Encounter (INDEPENDENT_AMBULATORY_CARE_PROVIDER_SITE_OTHER): Payer: Self-pay | Admitting: Bariatrics

## 2019-04-15 ENCOUNTER — Other Ambulatory Visit: Payer: Self-pay

## 2019-04-15 VITALS — BP 178/72 | HR 57 | Temp 97.6°F | Ht 67.0 in | Wt 176.0 lb

## 2019-04-15 DIAGNOSIS — F3289 Other specified depressive episodes: Secondary | ICD-10-CM

## 2019-04-15 DIAGNOSIS — I10 Essential (primary) hypertension: Secondary | ICD-10-CM

## 2019-04-15 DIAGNOSIS — E669 Obesity, unspecified: Secondary | ICD-10-CM | POA: Diagnosis not present

## 2019-04-15 DIAGNOSIS — Z6830 Body mass index (BMI) 30.0-30.9, adult: Secondary | ICD-10-CM

## 2019-04-15 MED ORDER — LOSARTAN POTASSIUM-HCTZ 100-12.5 MG PO TABS: 1.0000 | ORAL_TABLET | Freq: Every day | ORAL | 0 refills | Status: DC

## 2019-04-15 NOTE — Progress Notes (Signed)
Office: [phone]  /  Fax: [phone]   HPI:   Chief Complaint: OBESITY Meghan Diaz is here to discuss her progress with her obesity treatment plan. She is on the Category 2 plan and is following her eating plan approximately 70% of the time. She states she is walking 3.5-5 miles 7 times per week. Meghan Diaz is down an additional 1 lb and is doing well overall. She still craves things with "pumpkin spice." Her weight is 176 lb (79.8 kg) today and has had a weight loss of 1 pound over a period of 2 weeks since her last visit. She has lost 36 lbs since starting treatment with [place].  Hypertension Meghan Diaz is a 78 y.o. female with hypertension and is on Hyzaar 50/12.5. Meghan Diaz denies chest pain or shortness of breath on exertion. She is working weight loss to help control her blood pressure with the goal of decreasing her risk of heart attack and stroke. Meghan Diaz's blood pressure is not well controlled.  Depression with emotional eating behaviors Meghan Diaz is struggling with emotional eating and using food for comfort to the extent that it is negatively impacting her health. She often snacks when she is not hungry. Meghan Diaz sometimes feels she is out of control and then feels guilty that she made poor food choices. She has been working on behavior modification techniques to help reduce her emotional eating and has been somewhat successful. Meghan Diaz is taking Wellbutrin and shows no sign of suicidal or homicidal ideations.  Depression screen [facility] 2/9 08/12/2018 07/22/2018 06/10/2018 05/20/2018 05/06/2018  Decreased Interest 0 1 0 1 0  Down, Depressed, Hopeless 0 1 0 1 0  PHQ - 2 Score 0 2 0 2 0  Altered sleeping 1 1 0 0 1  Tired, decreased energy 0 1 0 0 0  Change in appetite 0 0 3 1 2   Feeling bad or failure about yourself  0 1 0 2 0  Trouble concentrating 0 1 0 1 0  Moving slowly or fidgety/restless 0 0 0 0 0  Suicidal thoughts 0 0 0 0 0  PHQ-9 Score 1 6 3 6 3   Difficult doing work/chores -  - - - -   ASSESSMENT AND PLAN:  Essential hypertension - Plan: losartan-hydrochlorothiazide (HYZAAR) 100-12.5 MG tablet  Other depression  Class 1 obesity with serious comorbidity and body mass index (BMI) of 30.0 to 30.9 in adult, unspecified obesity type  PLAN:  Hypertension We discussed sodium restriction, working on healthy weight loss, and a regular exercise program as the means to achieve improved blood pressure control. Meghan Diaz agreed with this plan and agreed to follow up as directed. We will continue to monitor her blood pressure as well as her progress with the above lifestyle modifications. Meghan Diaz was advised to decrease her salt intake. She was given a prescription for an increased dose of Hyzaar to 100/12.5 mg 1 daily #30 with 0 refills and will follow-up with our clinic in 1 week. She will watch for signs of hypotension as she continues her lifestyle modifications.  Depression with Emotional Eating Behaviors We discussed behavior modification techniques today to help Meghan Diaz deal with her emotional eating and depression. Meghan Diaz was advised to stop Wellbutrin. She will try the Xanax periodically (per another provider).  Obesity Meghan Diaz is currently in the action stage of change. As such, her goal is to continue with weight loss efforts. She has agreed to follow the Category 2 plan. Meghan Diaz will work on meal planning, intentional eating, and  increasing her water intake. Meghan Diaz has been instructed to increase activity (continue to walk) for weight loss and overall health benefits. We discussed the following Behavioral Modification Strategies today: increasing lean protein intake, decreasing simple carbohydrates, increasing vegetables, increase H20 intake, decrease eating out, no skipping meals, work on meal planning and easy cooking plans, keeping healthy foods in the home, and planning for success.  Meghan Diaz has agreed to follow-up with our clinic in 1 week. She was informed of the  importance of frequent follow-up visits to maximize her success with intensive lifestyle modifications for her multiple health conditions.  ALLERGIES: No Known Allergies  MEDICATIONS: Current Outpatient Medications on File Prior to Visit  Medication Sig Dispense Refill  . ALPRAZolam (XANAX) 0.5 MG tablet Take 0.5 mg by mouth at bedtime as needed for anxiety.    . Cholecalciferol (VITAMIN D3) 1.25 MG (50000 UT) CAPS Take 1 Dose by mouth once a week. 4 capsule 0  . levothyroxine (SYNTHROID, LEVOTHROID) 75 MCG tablet      No current facility-administered medications on file prior to visit.     PAST MEDICAL HISTORY: Past Medical History:  Diagnosis Date  . Decreased hearing   . Depression   . Floaters in visual field   . HTN (hypertension)   . Hyperlipidemia   . Hypothyroidism   . Obesity     PAST SURGICAL HISTORY: Past Surgical History:  Procedure Laterality Date  . ANKLE SURGERY Left     SOCIAL HISTORY: Social History   Tobacco Use  . Smoking status: Never Smoker  . Smokeless tobacco: Never Used  Substance Use Topics  . Alcohol use: Not on file  . Drug use: Not on file    FAMILY HISTORY: Family History  Problem Relation Age of Onset  . Diabetes Mother   . Hypertension Mother   . Kidney disease Mother   . Heart disease Mother   . Cancer Father    ROS: Review of Systems  Respiratory: Negative for shortness of breath.   Cardiovascular: Negative for chest pain.  Psychiatric/Behavioral: Positive for depression (emotional eating). Negative for suicidal ideas.       Negative for homicidal ideas.   PHYSICAL EXAM: Blood pressure (!) 178/72, pulse (!) 57, temperature 97.6 F (36.4 C), temperature source Oral, height 5\' 7"  (1.702 m), weight 176 lb (79.8 kg), SpO2 100 %. Body mass index is 27.57 kg/m. Physical Exam Vitals signs reviewed.  Constitutional:      Appearance: Normal appearance. She is obese.  Cardiovascular:     Rate and Rhythm: Normal rate.      Pulses: Normal pulses.  Pulmonary:     Effort: Pulmonary effort is normal.     Breath sounds: Normal breath sounds.  Musculoskeletal: Normal range of motion.  Skin:    General: Skin is warm and dry.  Neurological:     Mental Status: She is alert and oriented to person, place, and time.  Psychiatric:        Behavior: Behavior normal.   RECENT LABS AND TESTS: BMET    Component Value Date/Time   NA 143 02/05/2019 1136   K 5.4 (H) 02/05/2019 1136   CL 103 02/05/2019 1136   CO2 24 02/05/2019 1136   GLUCOSE 88 02/05/2019 1136   BUN 15 02/05/2019 1136   CREATININE 0.83 02/05/2019 1136   CALCIUM 10.1 02/05/2019 1136   GFRNONAA 69 02/05/2019 1136   GFRAA 80 02/05/2019 1136   Lab Results  Component Value Date   HGBA1C 5.5 02/05/2019  HGBA1C 5.5 08/12/2018   HGBA1C 5.6 03/20/2018   Lab Results  Component Value Date   INSULIN 6.8 02/05/2019   INSULIN 17.5 08/12/2018   INSULIN 10.4 03/20/2018   CBC No results found for: WBC, RBC, HGB, HCT, PLT, MCV, MCH, MCHC, RDW, LYMPHSABS, MONOABS, EOSABS, BASOSABS Iron/TIBC/Ferritin/ %Sat No results found for: IRON, TIBC, FERRITIN, IRONPCTSAT Lipid Panel     Component Value Date/Time   CHOL 200 (H) 08/12/2018 0955   TRIG 95 08/12/2018 0955   HDL 64 08/12/2018 0955   LDLCALC 117 (H) 08/12/2018 0955   Hepatic Function Panel     Component Value Date/Time   PROT 6.7 02/05/2019 1136   ALBUMIN 4.8 (H) 02/05/2019 1136   AST 18 02/05/2019 1136   ALT 12 02/05/2019 1136   ALKPHOS 75 02/05/2019 1136   BILITOT 0.5 02/05/2019 1136   No results found for: TSH  Results for TERI, LEGACY (MRN [id number]) as of 04/15/2019 12:07  Ref. Range 02/05/2019 11:36  Vitamin D, 25-Hydroxy Latest Ref Range: 30.0 - 100.0 ng/mL 35.6   OBESITY BEHAVIORAL INTERVENTION VISIT  Today's visit was #18  Starting weight: 212 lbs Starting date: 03/20/2018 Today's weight: 176 lbs  Today's date: 04/15/2019 Total lbs lost to date: 36 At least 15 minutes were  spent on discussing the following behavioral intervention visit.    04/15/2019  Height 5\' 7"  (1.702 m)  Weight 176 lb (79.8 kg)  BMI (Calculated) 27.56  BLOOD PRESSURE - SYSTOLIC 330  BLOOD PRESSURE - DIASTOLIC 72   Body Fat % 07.6 %  Total Body Water (lbs) 73 lbs   ASK: We discussed the diagnosis of obesity with Lamar Benes today and Shekia agreed to give [place] permission to discuss obesity behavioral modification therapy today.  ASSESS: Shauntay has the diagnosis of obesity and her BMI today is 27.6. Karmina is in the action stage of change.   ADVISE: Kaila was educated on the multiple health risks of obesity as well as the benefit of weight loss to improve her health. She was advised of the need for long term treatment and the importance of lifestyle modifications to improve her current health and to decrease her risk of future health problems.  AGREE: Multiple dietary modification options and treatment options were discussed and  Tallie agreed to follow the recommendations documented in the above note.  ARRANGE: Keimya was educated on the importance of frequent visits to treat obesity as outlined per CMS and USPSTF guidelines and agreed to schedule her next follow up appointment today.  Migdalia Dk, am acting as Location manager for [employer], DO  I have reviewed the above documentation for accuracy and completeness, and I agree with the above. -Jearld Lesch, DO

## 2019-04-23 ENCOUNTER — Encounter (INDEPENDENT_AMBULATORY_CARE_PROVIDER_SITE_OTHER): Payer: Self-pay | Admitting: Bariatrics

## 2019-04-23 ENCOUNTER — Other Ambulatory Visit: Payer: Self-pay

## 2019-04-23 ENCOUNTER — Ambulatory Visit (INDEPENDENT_AMBULATORY_CARE_PROVIDER_SITE_OTHER): Payer: Medicare HMO | Admitting: Bariatrics

## 2019-04-23 VITALS — BP 140/68 | HR 73 | Temp 98.6°F | Ht 67.0 in | Wt 172.0 lb

## 2019-04-23 DIAGNOSIS — Z6830 Body mass index (BMI) 30.0-30.9, adult: Secondary | ICD-10-CM | POA: Diagnosis not present

## 2019-04-23 DIAGNOSIS — I10 Essential (primary) hypertension: Secondary | ICD-10-CM | POA: Diagnosis not present

## 2019-04-23 DIAGNOSIS — F3289 Other specified depressive episodes: Secondary | ICD-10-CM

## 2019-04-23 DIAGNOSIS — E669 Obesity, unspecified: Secondary | ICD-10-CM

## 2019-04-28 ENCOUNTER — Encounter (INDEPENDENT_AMBULATORY_CARE_PROVIDER_SITE_OTHER): Payer: Self-pay | Admitting: Bariatrics

## 2019-04-28 NOTE — Progress Notes (Signed)
Office: [phone]  /  Fax: [phone]   HPI:   Chief Complaint: OBESITY Meghan Diaz is here to discuss her progress with her obesity treatment plan. She is on the Category 2 plan and is following her eating plan approximately 90% of the time. She states she is walking 3.5-5 miles 7 times per week. Meghan Diaz is down an additional 4 lbs for a total of 40 lbs lost. Her weight is 172 lb (78 kg) today and has had a weight loss of 4 pounds over a period of 2 weeks since her last visit. She has lost 40 lbs since starting treatment with [place].  Hypertension Meghan Diaz is a 78 y.o. female with hypertension.  Meghan Diaz denies chest pain or shortness of breath on exertion. She is working weight loss to help control her blood pressure with the goal of decreasing her risk of heart attack and stroke. Meghan Diaz blood pressure is 140/68 today (previously 178/72). Her blood pressure medication was increased at the time of her last visit.  Depression, Other Meghan Diaz is struggling with emotional eating and using food for comfort to the extent that it is negatively impacting her health. She often snacks when she is not hungry. Meghan Diaz sometimes feels she is out of control and then feels guilty that she made poor food choices. She has been working on behavior modification techniques to help reduce her emotional eating and has been somewhat successful. Meghan Diaz has stopped Wellbutrin. She shows no sign of suicidal or homicidal ideations.  Depression screen [facility] 2/9 08/12/2018 07/22/2018 06/10/2018 05/20/2018 05/06/2018  Decreased Interest 0 1 0 1 0  Down, Depressed, Hopeless 0 1 0 1 0  PHQ - 2 Score 0 2 0 2 0  Altered sleeping 1 1 0 0 1  Tired, decreased energy 0 1 0 0 0  Change in appetite 0 0 3 1 2   Feeling bad or failure about yourself  0 1 0 2 0  Trouble concentrating 0 1 0 1 0  Moving slowly or fidgety/restless 0 0 0 0 0  Suicidal thoughts 0 0 0 0 0  PHQ-9 Score 1 6 3 6 3   Difficult doing work/chores - -  - - -   ASSESSMENT AND PLAN:  Essential hypertension  Other depression - with emotional eating  Class 1 obesity with serious comorbidity and body mass index (BMI) of 30.0 to 30.9 in adult, unspecified obesity type - Starting BMI greater then 30  PLAN:  Hypertension We discussed sodium restriction, working on healthy weight loss, and a regular exercise program as the means to achieve improved blood pressure control. Meghan Diaz agreed with this plan and agreed to follow up as directed. We will continue to monitor her blood pressure as well as her progress with the above lifestyle modifications. She will continue her medications as prescribed and will watch for signs of hypotension as she continues her lifestyle modifications.  Depression, Other We discussed CBT techniques with the patient today. She will follow-up as directed to monitor her progress.  Obesity Meghan Diaz is currently in the action stage of change. As such, her goal is to continue with weight loss efforts. She has agreed to follow the Category 2 plan. Meghan Diaz will work on meal planning, intentional eating, and portion control. Meghan Diaz has been instructed to work up to a goal of 150 minutes of combined cardio and strengthening exercise per week for weight loss and overall health benefits. We discussed the following Behavioral Modification Strategies today: increasing lean protein intake,  decreasing simple carbohydrates, increasing vegetables, increase H20 intake, decrease eating out, no skipping meals, work on meal planning and easy cooking plans, and keeping healthy foods in the home.  Meghan Diaz has agreed to follow-up with our clinic in 2 weeks. She was informed of the importance of frequent follow-up visits to maximize her success with intensive lifestyle modifications for her multiple health conditions.  ALLERGIES: No Known Allergies  MEDICATIONS: Current Outpatient Medications on File Prior to Visit  Medication Sig Dispense  Refill  . ALPRAZolam (XANAX) 0.5 MG tablet Take 0.5 mg by mouth at bedtime as needed for anxiety.    . Cholecalciferol (VITAMIN D3) 1.25 MG (50000 UT) CAPS Take 1 Dose by mouth once a week. 4 capsule 0  . levothyroxine (SYNTHROID, LEVOTHROID) 75 MCG tablet     . losartan-hydrochlorothiazide (HYZAAR) 100-12.5 MG tablet Take 1 tablet by mouth daily. 30 tablet 0   No current facility-administered medications on file prior to visit.     PAST MEDICAL HISTORY: Past Medical History:  Diagnosis Date  . Decreased hearing   . Depression   . Floaters in visual field   . HTN (hypertension)   . Hyperlipidemia   . Hypothyroidism   . Obesity     PAST SURGICAL HISTORY: Past Surgical History:  Procedure Laterality Date  . ANKLE SURGERY Left     SOCIAL HISTORY: Social History   Tobacco Use  . Smoking status: Never Smoker  . Smokeless tobacco: Never Used  Substance Use Topics  . Alcohol use: Not on file  . Drug use: Not on file    FAMILY HISTORY: Family History  Problem Relation Age of Onset  . Diabetes Mother   . Hypertension Mother   . Kidney disease Mother   . Heart disease Mother   . Cancer Father    ROS: Review of Systems  Respiratory: Negative for shortness of breath.   Cardiovascular: Negative for chest pain.  Psychiatric/Behavioral: Positive for depression. Negative for suicidal ideas.       Negative for homicidal ideas.   PHYSICAL EXAM: Blood pressure 140/68, pulse 73, temperature 98.6 F (37 C), temperature source Oral, height 5\' 7"  (1.702 m), weight 172 lb (78 kg), SpO2 97 %. Body mass index is 26.94 kg/m. Physical Exam Vitals signs reviewed.  Constitutional:      Appearance: Normal appearance. She is obese.  Cardiovascular:     Rate and Rhythm: Normal rate.     Pulses: Normal pulses.  Pulmonary:     Effort: Pulmonary effort is normal.     Breath sounds: Normal breath sounds.  Musculoskeletal: Normal range of motion.  Skin:    General: Skin is warm and  dry.  Neurological:     Mental Status: She is alert and oriented to person, place, and time.  Psychiatric:        Behavior: Behavior normal.   RECENT LABS AND TESTS: BMET    Component Value Date/Time   NA 143 02/05/2019 1136   K 5.4 (H) 02/05/2019 1136   CL 103 02/05/2019 1136   CO2 24 02/05/2019 1136   GLUCOSE 88 02/05/2019 1136   BUN 15 02/05/2019 1136   CREATININE 0.83 02/05/2019 1136   CALCIUM 10.1 02/05/2019 1136   GFRNONAA 69 02/05/2019 1136   GFRAA 80 02/05/2019 1136   Lab Results  Component Value Date   HGBA1C 5.5 02/05/2019   HGBA1C 5.5 08/12/2018   HGBA1C 5.6 03/20/2018   Lab Results  Component Value Date   INSULIN 6.8 02/05/2019  INSULIN 17.5 08/12/2018   INSULIN 10.4 03/20/2018   CBC No results found for: WBC, RBC, HGB, HCT, PLT, MCV, MCH, MCHC, RDW, LYMPHSABS, MONOABS, EOSABS, BASOSABS Iron/TIBC/Ferritin/ %Sat No results found for: IRON, TIBC, FERRITIN, IRONPCTSAT Lipid Panel     Component Value Date/Time   CHOL 200 (H) 08/12/2018 0955   TRIG 95 08/12/2018 0955   HDL 64 08/12/2018 0955   LDLCALC 117 (H) 08/12/2018 0955   Hepatic Function Panel     Component Value Date/Time   PROT 6.7 02/05/2019 1136   ALBUMIN 4.8 (H) 02/05/2019 1136   AST 18 02/05/2019 1136   ALT 12 02/05/2019 1136   ALKPHOS 75 02/05/2019 1136   BILITOT 0.5 02/05/2019 1136   No results found for: TSH  Results for SHAKEILA, PFARR (MRN [id number]) as of 04/28/2019 14:58  Ref. Range 02/05/2019 11:36  Vitamin D, 25-Hydroxy Latest Ref Range: 30.0 - 100.0 ng/mL 35.6   OBESITY BEHAVIORAL INTERVENTION VISIT  Today's visit was #19  Starting weight: 212 lbs Starting date: 03/20/2018 Today's weight: 172 lbs  Today's date: 04/23/2019 Total lbs lost to date: 40 At least 15 minutes were spent on discussing the following behavioral intervention visit.    04/23/2019  Height 5\' 7"  (1.702 m)  Weight 172 lb (78 kg)  BMI (Calculated) 26.93  BLOOD PRESSURE - SYSTOLIC 140  BLOOD  PRESSURE - DIASTOLIC 68   Body Fat % 39 %  Total Body Water (lbs) 73 lbs   ASK: We discussed the diagnosis of obesity with today and Rashelle agreed to give [name] permission to discuss obesity behavioral modification therapy today.  ASSESS: Cleta has the diagnosis of obesity and her BMI today is 27.1. Lillianah is in the action stage of change.   ADVISE: Kamisha was educated on the multiple health risks of obesity as well as the benefit of weight loss to improve her health. She was advised of the need for long term treatment and the importance of lifestyle modifications to improve her current health and to decrease her risk of future health problems.  AGREE: Multiple dietary modification options and treatment options were discussed and  Tianah agreed to follow the recommendations documented in the above note.  ARRANGE: Asencion was educated on the importance of frequent visits to treat obesity as outlined per CMS and USPSTF guidelines and agreed to schedule her next follow up appointment today.  Meghan Catalina, am acting as Fernanda Drum for Energy manager, DO  I have reviewed the above documentation for accuracy and completeness, and I agree with the above. -[employer], DO

## 2019-05-14 ENCOUNTER — Other Ambulatory Visit: Payer: Self-pay

## 2019-05-14 ENCOUNTER — Ambulatory Visit (INDEPENDENT_AMBULATORY_CARE_PROVIDER_SITE_OTHER): Payer: Medicare HMO | Admitting: Bariatrics

## 2019-05-14 ENCOUNTER — Encounter (INDEPENDENT_AMBULATORY_CARE_PROVIDER_SITE_OTHER): Payer: Self-pay | Admitting: Bariatrics

## 2019-05-14 VITALS — BP 154/69 | HR 60 | Temp 98.3°F | Ht 67.0 in | Wt 174.0 lb

## 2019-05-14 DIAGNOSIS — F3289 Other specified depressive episodes: Secondary | ICD-10-CM

## 2019-05-14 DIAGNOSIS — I10 Essential (primary) hypertension: Secondary | ICD-10-CM | POA: Diagnosis not present

## 2019-05-14 DIAGNOSIS — E038 Other specified hypothyroidism: Secondary | ICD-10-CM

## 2019-05-14 DIAGNOSIS — Z6830 Body mass index (BMI) 30.0-30.9, adult: Secondary | ICD-10-CM | POA: Diagnosis not present

## 2019-05-14 DIAGNOSIS — E669 Obesity, unspecified: Secondary | ICD-10-CM | POA: Diagnosis not present

## 2019-05-14 MED ORDER — ESCITALOPRAM OXALATE 10 MG PO TABS: 10.0000 mg | ORAL_TABLET | Freq: Every day | ORAL | 0 refills | Status: DC

## 2019-05-18 ENCOUNTER — Encounter (INDEPENDENT_AMBULATORY_CARE_PROVIDER_SITE_OTHER): Payer: Self-pay | Admitting: Bariatrics

## 2019-05-18 NOTE — Progress Notes (Signed)
Office: [phone]  /  Fax: [phone]   HPI:   Chief Complaint: OBESITY Meghan Diaz is here to discuss her progress with her obesity treatment plan. She is on the Category 2 plan and is following her eating plan approximately 30 % of the time. She states she is walking 3.5-5 miles 7 times per week. Meghan Diaz is up 2 lbs. She ate extra things for her birthday.  Her weight is 174 lb (78.9 kg) today and has gained 2 lbs since her last visit. She has lost 38 lbs since starting treatment with [place].  Hypertension Meghan Diaz is a 78 y.o. female with hypertension. Meghan Diaz has been under a lot of stress. Her blood pressure is 154/69 today (she was stuck in traffic). She denies chest pain. She had a recent increase in losartan-hydrochlorothiazide. She is working on weight loss to help control her blood pressure with the goal of decreasing her risk of heart attack and stroke. Meghan Diaz blood pressure is not currently controlled.  Hypothyroidism Meghan Diaz has a diagnosis of hypothyroidism. She is currently on Synthroid and is well controlled. She denies hot or cold intolerance or palpitations.  Depression with Emotional Eating Behaviors Meghan Diaz notes increased polyphagia and anxiety. Meghan Diaz struggles with emotional eating and using food for comfort to the extent that it is negatively impacting her health. She often snacks when she is not hungry. Meghan Diaz sometimes feels she is out of control and then feels guilty that she made poor food choices. She has been working on behavior modification techniques to help reduce her emotional eating and has been somewhat successful. She shows no sign of suicidal or homicidal ideations.  Depression screen Meghan Diaz 2/9 08/12/2018 07/22/2018 06/10/2018 05/20/2018 05/06/2018  Decreased Interest 0 1 0 1 0  Down, Depressed, Hopeless 0 1 0 1 0  PHQ - 2 Score 0 2 0 2 0  Altered sleeping 1 1 0 0 1  Tired, decreased energy 0 1 0 0 0  Change in appetite 0 0 3 1 2   Feeling bad or failure  about yourself  0 1 0 2 0  Trouble concentrating 0 1 0 1 0  Moving slowly or fidgety/restless 0 0 0 0 0  Suicidal thoughts 0 0 0 0 0  PHQ-9 Score 1 6 3 6 3   Difficult doing work/chores - - - - -    ASSESSMENT AND PLAN:  Essential hypertension  Other specified hypothyroidism  Other depression - with emotional eating  Class 1 obesity with serious comorbidity and body mass index (BMI) of 30.0 to 30.9 in adult, unspecified obesity type - Starting BMI greater then 30  PLAN:  Hypertension We discussed sodium restriction, working on healthy weight loss, and a regular exercise program as the means to achieve improved blood pressure control. [name] agreed with this plan and agreed to follow up as directed. We will continue to monitor her blood pressure as well as her progress with the above lifestyle modifications. [name] agrees to continue her medications and will watch for signs of hypotension as she continues her lifestyle modifications. [name] agrees to follow up with our clinic in 2 to 3 weeks.  Hypothyroidism Meghan Diaz was informed of the importance of good thyroid control to help with weight loss efforts. She was also informed that supertheraputic thyroid levels are dangerous and will not improve weight loss results. Meghan Diaz agrees to continue her medications, and she agrees to follow up with our clinic in 2 to 3 weeks.  Depression with Emotional Eating Behaviors We  discussed behavior modification techniques today to help Meghan Diaz deal with her emotional eating and depression. [name] agrees to start Lexapro 10 mg 1 tablet daily #30 with no refills.  Obesity Meghan Diaz is currently in the action stage of change. As such, her goal is to continue with weight loss efforts She has agreed to follow the Category 2 plan Meghan Diaz has been instructed to work up to a goal of 150 minutes of combined cardio and strengthening exercise per week or continue walking and add weights for weight loss and overall  health benefits. We discussed the following Behavioral Modification Strategies today: increasing lean protein intake, decreasing simple carbohydrates, increasing vegetables, decrease eating out, increase H20 intake, no skipping meals, work on meal planning and easy cooking plans, keeping healthy foods in the home, and planning for success Recipes were given today.  Meghan Diaz has agreed to follow up with our clinic in 2 to 3 weeks. She was informed of the importance of frequent follow up visits to maximize her success with intensive lifestyle modifications for her multiple health conditions.  ALLERGIES: No Known Allergies  MEDICATIONS: Current Outpatient Medications on File Prior to Visit  Medication Sig Dispense Refill  . ALPRAZolam (XANAX) 0.5 MG tablet Take 0.5 mg by mouth at bedtime as needed for anxiety.    . Cholecalciferol (VITAMIN D3) 1.25 MG (50000 UT) CAPS Take 1 Dose by mouth once a week. 4 capsule 0  . levothyroxine (SYNTHROID, LEVOTHROID) 75 MCG tablet     . losartan-hydrochlorothiazide (HYZAAR) 100-12.5 MG tablet Take 1 tablet by mouth daily. 30 tablet 0   No current facility-administered medications on file prior to visit.     PAST MEDICAL HISTORY: Past Medical History:  Diagnosis Date  . Decreased hearing   . Depression   . Floaters in visual field   . HTN (hypertension)   . Hyperlipidemia   . Hypothyroidism   . Obesity     PAST SURGICAL HISTORY: Past Surgical History:  Procedure Laterality Date  . ANKLE SURGERY Left     SOCIAL HISTORY: Social History   Tobacco Use  . Smoking status: Never Smoker  . Smokeless tobacco: Never Used  Substance Use Topics  . Alcohol use: Not on file  . Drug use: Not on file    FAMILY HISTORY: Family History  Problem Relation Age of Onset  . Diabetes Mother   . Hypertension Mother   . Kidney disease Mother   . Heart disease Mother   . Cancer Father     ROS: Review of Systems  Constitutional: Negative for weight loss.   Cardiovascular: Negative for chest pain and palpitations.  Endo/Heme/Allergies:       Negative hot/cold intolerance  Psychiatric/Behavioral: Positive for depression. Negative for suicidal ideas.    PHYSICAL EXAM: Blood pressure (!) 154/69, pulse 60, temperature 98.3 F (36.8 C), height 5\' 7"  (1.702 m), weight 174 lb (78.9 kg), SpO2 99 %. Body mass index is 27.25 kg/m. Physical Exam Vitals signs reviewed.  Constitutional:      Appearance: Normal appearance. She is obese.  Cardiovascular:     Rate and Rhythm: Normal rate.     Pulses: Normal pulses.  Pulmonary:     Effort: Pulmonary effort is normal.     Breath sounds: Normal breath sounds.  Musculoskeletal: Normal range of motion.  Skin:    General: Skin is warm and dry.  Neurological:     Mental Status: She is alert and oriented to person, place, and time.  Psychiatric:  Mood and Affect: Mood normal.        Behavior: Behavior normal.     RECENT LABS AND TESTS: BMET    Component Value Date/Time   NA 143 02/05/2019 1136   K 5.4 (H) 02/05/2019 1136   CL 103 02/05/2019 1136   CO2 24 02/05/2019 1136   GLUCOSE 88 02/05/2019 1136   BUN 15 02/05/2019 1136   CREATININE 0.83 02/05/2019 1136   CALCIUM 10.1 02/05/2019 1136   GFRNONAA 69 02/05/2019 1136   GFRAA 80 02/05/2019 1136   Lab Results  Component Value Date   HGBA1C 5.5 02/05/2019   HGBA1C 5.5 08/12/2018   HGBA1C 5.6 03/20/2018   Lab Results  Component Value Date   INSULIN 6.8 02/05/2019   INSULIN 17.5 08/12/2018   INSULIN 10.4 03/20/2018   CBC No results found for: WBC, RBC, HGB, HCT, PLT, MCV, MCH, MCHC, RDW, LYMPHSABS, MONOABS, EOSABS, BASOSABS Iron/TIBC/Ferritin/ %Sat No results found for: IRON, TIBC, FERRITIN, IRONPCTSAT Lipid Panel     Component Value Date/Time   CHOL 200 (H) 08/12/2018 0955   TRIG 95 08/12/2018 0955   HDL 64 08/12/2018 0955   LDLCALC 117 (H) 08/12/2018 0955   Hepatic Function Panel     Component Value Date/Time   PROT  6.7 02/05/2019 1136   ALBUMIN 4.8 (H) 02/05/2019 1136   AST 18 02/05/2019 1136   ALT 12 02/05/2019 1136   ALKPHOS 75 02/05/2019 1136   BILITOT 0.5 02/05/2019 1136   No results found for: TSH    OBESITY BEHAVIORAL INTERVENTION VISIT  Today's visit was # 20   Starting weight: 212 lbs Starting date: 03/20/18 Today's weight : 174 lbs Today's date: 05/14/2019 Total lbs lost to date: 38 At least 15 minutes were spent on discussing the following behavioral intervention visit.   ASK: We discussed the diagnosis of obesity with Meghan Diaz today and Meghan Diaz agreed to give [place] permission to discuss obesity behavioral modification therapy today.  ASSESS: Meghan Diaz has the diagnosis of obesity and her BMI today is 27.25 Meghan Diaz is in the action stage of change   ADVISE: Zyan was educated on the multiple health risks of obesity as well as the benefit of weight loss to improve her health. She was advised of the need for long term treatment and the importance of lifestyle modifications to improve her current health and to decrease her risk of future health problems.  AGREE: Multiple dietary modification options and treatment options were discussed and  Kateryna agreed to follow the recommendations documented in the above note.  ARRANGE: Karlynn was educated on the importance of frequent visits to treat obesity as outlined per CMS and USPSTF guidelines and agreed to schedule her next follow up appointment today.  Trude Mcburney, am acting as transcriptionist for [employer], DO  I have reviewed the above documentation for accuracy and completeness, and I agree with the above. -Corinna Capra, DO

## 2019-05-28 ENCOUNTER — Other Ambulatory Visit: Payer: Self-pay

## 2019-05-28 ENCOUNTER — Ambulatory Visit (INDEPENDENT_AMBULATORY_CARE_PROVIDER_SITE_OTHER): Payer: Medicare HMO | Admitting: Bariatrics

## 2019-05-28 ENCOUNTER — Encounter (INDEPENDENT_AMBULATORY_CARE_PROVIDER_SITE_OTHER): Payer: Self-pay | Admitting: Bariatrics

## 2019-05-28 VITALS — BP 136/69 | HR 60 | Temp 97.7°F | Ht 67.0 in | Wt 171.0 lb

## 2019-05-28 DIAGNOSIS — I10 Essential (primary) hypertension: Secondary | ICD-10-CM | POA: Diagnosis not present

## 2019-05-28 DIAGNOSIS — F3289 Other specified depressive episodes: Secondary | ICD-10-CM

## 2019-05-28 DIAGNOSIS — E669 Obesity, unspecified: Secondary | ICD-10-CM

## 2019-05-28 DIAGNOSIS — Z6830 Body mass index (BMI) 30.0-30.9, adult: Secondary | ICD-10-CM | POA: Diagnosis not present

## 2019-05-28 NOTE — Progress Notes (Signed)
Office: [phone]  /  Fax: [phone]   HPI:   Chief Complaint: OBESITY Meghan Diaz is here to discuss her progress with her obesity treatment plan. She is on the Category 2 plan and is following her eating plan approximately 75% of the time. She states she is walking 3.5-5 miles 7 times per week. Meghan Diaz is down an additional 3 lbs and has done well. She is getting in adequate protein. She will not go below a BMI of 25.  Her weight is 171 lb (77.6 kg) today and has had a weight loss of 3 pounds over a period of 2 weeks since her last visit. She has lost 41 lbs since starting treatment with [place].  Depression with emotional eating behaviors Meghan Diaz is struggling with emotional eating and using food for comfort to the extent that it is negatively impacting her health. She often snacks when she is not hungry. Meghan Diaz sometimes feels she is out of control and then feels guilty that she made poor food choices. She has been working on behavior modification techniques to help reduce her emotional eating and has been somewhat successful. Meghan Diaz was put on Lexapro at her last visit but did not take it. She is taking Xanax per her PCP. She shows no sign of suicidal or homicidal ideations.  Depression screen [facility] 2/9 08/12/2018 07/22/2018 06/10/2018 05/20/2018 05/06/2018  Decreased Interest 0 1 0 1 0  Down, Depressed, Hopeless 0 1 0 1 0  PHQ - 2 Score 0 2 0 2 0  Altered sleeping 1 1 0 0 1  Tired, decreased energy 0 1 0 0 0  Change in appetite 0 0 3 1 2   Feeling bad or failure about yourself  0 1 0 2 0  Trouble concentrating 0 1 0 1 0  Moving slowly or fidgety/restless 0 0 0 0 0  Suicidal thoughts 0 0 0 0 0  PHQ-9 Score 1 6 3 6 3   Difficult doing work/chores - - - - -   Hypertension Meghan Diaz is a 78 y.o. female with hypertension and is taking Losartan/HCTZ. Meghan Diaz denies chest pain or shortness of breath on exertion. She is working weight loss to help control her blood pressure with the  goal of decreasing her risk of heart attack and stroke. Meghan Diaz's blood pressure is controlled at 136/69.   ASSESSMENT AND PLAN:  Essential hypertension  Other depression, emotional eating  Class 1 obesity with serious comorbidity and body mass index (BMI) of 30.0 to 30.9 in adult, unspecified obesity type  PLAN:  Depression with Emotional Eating Behaviors We discussed behavior modification techniques today to help Meghan Diaz deal with her emotional eating and depression. She was advised to put Lexapro on hold.  Hypertension We discussed sodium restriction, working on healthy weight loss, and a regular exercise program as the means to achieve improved blood pressure control. Meghan Diaz agreed with this plan and agreed to follow up as directed. We will continue to monitor her blood pressure as well as her progress with the above lifestyle modifications. She will continue her medications as prescribed and will watch for signs of hypotension as she continues her lifestyle modifications.  Obesity Meghan Diaz is currently in the action stage of change. As such, her goal is to continue with weight loss efforts. She has agreed to follow the Category 2 plan. Meghan Diaz will work on meal planning, intentional eating, and increasing her water intake. Meghan Diaz has been instructed to continue walking daily and add in weights and  resistance bands for weight loss and overall health benefits. We discussed the following Behavioral Modification Strategies today: increasing lean protein intake, decreasing simple carbohydrates, increasing vegetables, increase H20 intake, decrease eating out, no skipping meals, work on meal planning and easy cooking plans, keeping healthy foods in the home, travel eating strategies, holiday eating strategies, and celebration eating strategies.  Meghan Diaz has agreed to follow-up with our clinic in 2-3 weeks. She was informed of the importance of frequent follow-up visits to maximize her success with  intensive lifestyle modifications for her multiple health conditions.  ALLERGIES: No Known Allergies  MEDICATIONS: Current Outpatient Medications on File Prior to Visit  Medication Sig Dispense Refill  . ALPRAZolam (XANAX) 0.5 MG tablet Take 0.5 mg by mouth at bedtime as needed for anxiety.    . Cholecalciferol (VITAMIN D3) 1.25 MG (50000 UT) CAPS Take 1 Dose by mouth once a week. 4 capsule 0  . escitalopram (LEXAPRO) 10 MG tablet Take 1 tablet (10 mg total) by mouth daily. 30 tablet 0  . levothyroxine (SYNTHROID, LEVOTHROID) 75 MCG tablet     . losartan-hydrochlorothiazide (HYZAAR) 100-12.5 MG tablet Take 1 tablet by mouth daily. 30 tablet 0   No current facility-administered medications on file prior to visit.     PAST MEDICAL HISTORY: Past Medical History:  Diagnosis Date  . Decreased hearing   . Depression   . Floaters in visual field   . HTN (hypertension)   . Hyperlipidemia   . Hypothyroidism   . Obesity     PAST SURGICAL HISTORY: Past Surgical History:  Procedure Laterality Date  . ANKLE SURGERY Left     SOCIAL HISTORY: Social History   Tobacco Use  . Smoking status: Never Smoker  . Smokeless tobacco: Never Used  Substance Use Topics  . Alcohol use: Not on file  . Drug use: Not on file    FAMILY HISTORY: Family History  Problem Relation Age of Onset  . Diabetes Mother   . Hypertension Mother   . Kidney disease Mother   . Heart disease Mother   . Cancer Father    ROS: Review of Systems  Respiratory: Negative for shortness of breath.   Cardiovascular: Negative for chest pain.  Psychiatric/Behavioral: Positive for depression (emotional eating). Negative for suicidal ideas.       Negative for homicidal ideas.   PHYSICAL EXAM: Blood pressure 136/69, pulse 60, temperature 97.7 F (36.5 C), height 5\' 7"  (1.702 m), weight 171 lb (77.6 kg), SpO2 98 %. Body mass index is 26.78 kg/m. Physical Exam Vitals signs reviewed.  Constitutional:       Appearance: Normal appearance. She is obese.  Cardiovascular:     Rate and Rhythm: Normal rate.     Pulses: Normal pulses.  Pulmonary:     Effort: Pulmonary effort is normal.     Breath sounds: Normal breath sounds.  Musculoskeletal: Normal range of motion.  Skin:    General: Skin is warm and dry.  Neurological:     Mental Status: She is alert and oriented to person, place, and time.  Psychiatric:        Behavior: Behavior normal.   RECENT LABS AND TESTS: BMET    Component Value Date/Time   NA 143 02/05/2019 1136   K 5.4 (H) 02/05/2019 1136   CL 103 02/05/2019 1136   CO2 24 02/05/2019 1136   GLUCOSE 88 02/05/2019 1136   BUN 15 02/05/2019 1136   CREATININE 0.83 02/05/2019 1136   CALCIUM 10.1 02/05/2019 1136  GFRNONAA 69 02/05/2019 1136   GFRAA 80 02/05/2019 1136   Lab Results  Component Value Date   HGBA1C 5.5 02/05/2019   HGBA1C 5.5 08/12/2018   HGBA1C 5.6 03/20/2018   Lab Results  Component Value Date   INSULIN 6.8 02/05/2019   INSULIN 17.5 08/12/2018   INSULIN 10.4 03/20/2018   CBC No results found for: WBC, RBC, HGB, HCT, PLT, MCV, MCH, MCHC, RDW, LYMPHSABS, MONOABS, EOSABS, BASOSABS Iron/TIBC/Ferritin/ %Sat No results found for: IRON, TIBC, FERRITIN, IRONPCTSAT Lipid Panel     Component Value Date/Time   CHOL 200 (H) 08/12/2018 0955   TRIG 95 08/12/2018 0955   HDL 64 08/12/2018 0955   LDLCALC 117 (H) 08/12/2018 0955   Hepatic Function Panel     Component Value Date/Time   PROT 6.7 02/05/2019 1136   ALBUMIN 4.8 (H) 02/05/2019 1136   AST 18 02/05/2019 1136   ALT 12 02/05/2019 1136   ALKPHOS 75 02/05/2019 1136   BILITOT 0.5 02/05/2019 1136   No results found for: TSH  Results for CALIYAH, SIEH (MRN [id number]) as of 05/28/2019 09:04  Ref. Range 02/05/2019 11:36  Vitamin D, 25-Hydroxy Latest Ref Range: 30.0 - 100.0 ng/mL 35.6   OBESITY BEHAVIORAL INTERVENTION VISIT  Today's visit was #21  Starting weight: 212 lbs Starting date:  03/20/2018 Today's weight: 171 lbs  Today's date: 05/28/2019 Total lbs lost to date: 41 At least 15 minutes were spent on discussing the following behavioral intervention visit.    05/28/2019  Height 5\' 7"  (1.702 m)  Weight 171 lb (77.6 kg)  BMI (Calculated) 26.78  BLOOD PRESSURE - SYSTOLIC 136  BLOOD PRESSURE - DIASTOLIC 69   Body Fat % 38.8 %  Total Body Water (lbs) 69.2 lbs   ASK: We discussed the diagnosis of obesity with today and Meghan Diaz agreed to give Meghan Diaz permission to discuss obesity behavioral modification therapy today.  ASSESS: Alta has the diagnosis of obesity and her BMI today is 26.9. Vantasia is in the action stage of change.   ADVISE: Jalicia was educated on the multiple health risks of obesity as well as the benefit of weight loss to improve her health. She was advised of the need for long term treatment and the importance of lifestyle modifications to improve her current health and to decrease her risk of future health problems.  AGREE: Multiple dietary modification options and treatment options were discussed and  Anay agreed to follow the recommendations documented in the above note.  ARRANGE: Tenae was educated on the importance of frequent visits to treat obesity as outlined per CMS and USPSTF guidelines and agreed to schedule her next follow up appointment today.  Meghan Catalina, am acting as Fernanda Drum for Energy manager, DO  I have reviewed the above documentation for accuracy and completeness, and I agree with the above. -[employer], DO

## 2019-06-11 ENCOUNTER — Other Ambulatory Visit: Payer: Self-pay

## 2019-06-11 ENCOUNTER — Encounter (INDEPENDENT_AMBULATORY_CARE_PROVIDER_SITE_OTHER): Payer: Self-pay | Admitting: Bariatrics

## 2019-06-11 ENCOUNTER — Ambulatory Visit (INDEPENDENT_AMBULATORY_CARE_PROVIDER_SITE_OTHER): Payer: Medicare HMO | Admitting: Bariatrics

## 2019-06-11 VITALS — BP 126/68 | HR 60 | Temp 97.8°F | Ht 67.0 in | Wt 173.0 lb

## 2019-06-11 DIAGNOSIS — F3289 Other specified depressive episodes: Secondary | ICD-10-CM

## 2019-06-11 DIAGNOSIS — I10 Essential (primary) hypertension: Secondary | ICD-10-CM

## 2019-06-11 DIAGNOSIS — Z6830 Body mass index (BMI) 30.0-30.9, adult: Secondary | ICD-10-CM | POA: Diagnosis not present

## 2019-06-11 DIAGNOSIS — E669 Obesity, unspecified: Secondary | ICD-10-CM | POA: Diagnosis not present

## 2019-06-11 DIAGNOSIS — E66811 Obesity, class 1: Secondary | ICD-10-CM

## 2019-06-11 NOTE — Progress Notes (Signed)
Office: [phone]  /  Fax: [phone]   HPI:   Chief Complaint: OBESITY Meghan Diaz is here to discuss her progress with her obesity treatment plan. She is on the Category 2 plan and is following her eating plan approximately 70% of the time. She states she is walking 3.5-5 miles 7 days per week. Meghan Diaz is up 2 lbs during the holiday. She has eaten more sweets. She is up 3 lbs of water. Her weight is 173 lb (78.5 kg) today and has had a weight gain of 2 lbs since her last visit. She has lost 39 lbs since starting treatment with [place].  Hypertension Meghan Diaz is a 78 y.o. female with hypertension.  Meghan Diaz denies chest pain or shortness of breath on exertion. She is working weight loss to help control her blood pressure with the goal of decreasing her risk of heart attack and stroke. Meghan Diaz's blood pressure average has been decreased in the past 2 weeks with systolic averaging in the 016'W and diastolics in the 10'X. She reports she is still taking losartan/HCTZ. Her blood pressure medication dose was increased about a month ago but she did not increase it.  Depression with emotional eating behaviors Meghan Diaz is struggling with emotional eating and using food for comfort to the extent that it is negatively impacting her health. She often snacks when she is not hungry. Meghan Diaz sometimes feels she is out of control and then feels guilty that she made poor food choices. She has been working on behavior modification techniques to help reduce her emotional eating and has been somewhat successful. Meghan Diaz had taken Wellbutrin in the past but is on no medications currently. She shows no sign of suicidal or homicidal ideations.  Depression screen [facility] 2/9 08/12/2018 07/22/2018 06/10/2018 05/20/2018 05/06/2018  Decreased Interest 0 1 0 1 0  Down, Depressed, Hopeless 0 1 0 1 0  PHQ - 2 Score 0 2 0 2 0  Altered sleeping 1 1 0 0 1  Tired, decreased energy 0 1 0 0 0  Change in appetite 0 0 3 1 2    Feeling bad or failure about yourself  0 1 0 2 0  Trouble concentrating 0 1 0 1 0  Moving slowly or fidgety/restless 0 0 0 0 0  Suicidal thoughts 0 0 0 0 0  PHQ-9 Score 1 6 3 6 3   Difficult doing work/chores - - - - -   ASSESSMENT AND PLAN:  Essential hypertension  Other depression, emotional eating  Class 1 obesity with serious comorbidity and body mass index (BMI) of 30.0 to 30.9 in adult, unspecified obesity type - Starting BMI greater then 30  PLAN:  Hypertension We discussed sodium restriction, working on healthy weight loss, and a regular exercise program as the means to achieve improved blood pressure control. Meghan Diaz agreed with this plan and agreed to follow up as directed. We will continue to monitor her blood pressure as well as her progress with the above lifestyle modifications. Meghan Diaz will continue taking her blood pressure at home and she will bring in her blood pressure cuff so that we can check it with our cuff. She will watch for signs of hypotension as she continues her lifestyle modifications.  Emotional Eating Behaviors (other depression) We discussed CBT techniques with Meghan Diaz today. She will follow-up as directed to monitor her progress.  Obesity Meghan Diaz is currently in the action stage of change. As such, her goal is to continue with weight loss efforts. She has  agreed to follow the Category 2 plan. Meghan Diaz will work on meal planning and increasing her water intake. She will remove all "bad things" from the house. Meghan Diaz has been instructed to work up to a goal of 150 minutes of combined cardio and strengthening exercise per week for weight loss and overall health benefits. We discussed the following Behavioral Modification Strategies today: increasing lean protein intake, decreasing simple carbohydrates, increasing vegetables, increase H20 intake, decrease eating out, no skipping meals, work on meal planning and easy cooking plans, and keeping healthy foods in the  home.  Meghan Diaz has agreed to follow-up with our clinic in 2 weeks. She was informed of the importance of frequent follow-up visits to maximize her success with intensive lifestyle modifications for her multiple health conditions.  ALLERGIES: No Known Allergies  MEDICATIONS: Current Outpatient Medications on File Prior to Visit  Medication Sig Dispense Refill  . ALPRAZolam (XANAX) 0.5 MG tablet Take 0.5 mg by mouth at bedtime as needed for anxiety.    . Cholecalciferol (VITAMIN D3) 1.25 MG (50000 UT) CAPS Take 1 Dose by mouth once a week. 4 capsule 0  . escitalopram (LEXAPRO) 10 MG tablet Take 1 tablet (10 mg total) by mouth daily. 30 tablet 0  . levothyroxine (SYNTHROID, LEVOTHROID) 75 MCG tablet     . losartan-hydrochlorothiazide (HYZAAR) 100-12.5 MG tablet Take 1 tablet by mouth daily. 30 tablet 0   No current facility-administered medications on file prior to visit.     PAST MEDICAL HISTORY: Past Medical History:  Diagnosis Date  . Decreased hearing   . Depression   . Floaters in visual field   . HTN (hypertension)   . Hyperlipidemia   . Hypothyroidism   . Obesity     PAST SURGICAL HISTORY: Past Surgical History:  Procedure Laterality Date  . ANKLE SURGERY Left     SOCIAL HISTORY: Social History   Tobacco Use  . Smoking status: Never Smoker  . Smokeless tobacco: Never Used  Substance Use Topics  . Alcohol use: Not on file  . Drug use: Not on file    FAMILY HISTORY: Family History  Problem Relation Age of Onset  . Diabetes Mother   . Hypertension Mother   . Kidney disease Mother   . Heart disease Mother   . Cancer Father    ROS: Review of Systems  Respiratory: Negative for shortness of breath.   Cardiovascular: Negative for chest pain.  Psychiatric/Behavioral: Positive for depression (emotional eating). Negative for suicidal ideas.       Negative for homicidal ideas.   PHYSICAL EXAM: Pulse 60, temperature 97.8 F (36.6 C), height 5\' 7"  (1.702 m),  weight 173 lb (78.5 kg), SpO2 98 %. Body mass index is 27.1 kg/m. Physical Exam Vitals signs reviewed.  Constitutional:      Appearance: Normal appearance. She is obese.  Cardiovascular:     Rate and Rhythm: Normal rate.     Pulses: Normal pulses.  Pulmonary:     Effort: Pulmonary effort is normal.     Breath sounds: Normal breath sounds.  Musculoskeletal: Normal range of motion.  Skin:    General: Skin is warm and dry.  Neurological:     Mental Status: She is alert and oriented to person, place, and time.  Psychiatric:        Behavior: Behavior normal.   RECENT LABS AND TESTS: BMET    Component Value Date/Time   NA 143 02/05/2019 1136   K 5.4 (H) 02/05/2019 1136   CL  103 02/05/2019 1136   CO2 24 02/05/2019 1136   GLUCOSE 88 02/05/2019 1136   BUN 15 02/05/2019 1136   CREATININE 0.83 02/05/2019 1136   CALCIUM 10.1 02/05/2019 1136   GFRNONAA 69 02/05/2019 1136   GFRAA 80 02/05/2019 1136   Lab Results  Component Value Date   HGBA1C 5.5 02/05/2019   HGBA1C 5.5 08/12/2018   HGBA1C 5.6 03/20/2018   Lab Results  Component Value Date   INSULIN 6.8 02/05/2019   INSULIN 17.5 08/12/2018   INSULIN 10.4 03/20/2018   CBC No results found for: WBC, RBC, HGB, HCT, PLT, MCV, MCH, MCHC, RDW, LYMPHSABS, MONOABS, EOSABS, BASOSABS Iron/TIBC/Ferritin/ %Sat No results found for: IRON, TIBC, FERRITIN, IRONPCTSAT Lipid Panel     Component Value Date/Time   CHOL 200 (H) 08/12/2018 0955   TRIG 95 08/12/2018 0955   HDL 64 08/12/2018 0955   LDLCALC 117 (H) 08/12/2018 0955   Hepatic Function Panel     Component Value Date/Time   PROT 6.7 02/05/2019 1136   ALBUMIN 4.8 (H) 02/05/2019 1136   AST 18 02/05/2019 1136   ALT 12 02/05/2019 1136   ALKPHOS 75 02/05/2019 1136   BILITOT 0.5 02/05/2019 1136   No results found for: TSH  Results for Meghan SowROBBINS, Kyna S (MRN [id number]) as of 06/11/2019 09:46  Ref. Range 02/05/2019 11:36  Vitamin D, 25-Hydroxy Latest Ref Range: 30.0 - 100.0  ng/mL 35.6   OBESITY BEHAVIORAL INTERVENTION VISIT  Today's visit was #22   Starting weight: 212 lbs Starting date: 03/20/2018 Today's weight: 173 lbs  Today's date: 06/11/2019 Total lbs lost to date: 39  At least 15 minutes were spent on discussing the following behavioral intervention visit.    06/11/2019  Height 5\' 7"  (1.702 m)  Weight 173 lb (78.5 kg)  BMI (Calculated) 27.09   Body Fat % 39.4 %  Total Body Water (lbs) 72.8 lbs   ASK: We discussed the diagnosis of obesity with Meghan Diaz today and Wrigley agreed to give us permission to discuss obesity behavioral modification therapy today.  ASSESS: Meghan Diaz has the diagnosis of obesity and her BMI today is 27.2. Meghan Diaz is in the action stage of change.   ADVISE: Meghan Diaz was educated on the multiple health risks of obesity as well as the benefit of weight loss to improve her health. She was advised of the need for long term treatment and the importance of lifestyle modifications to improve her current health and to decrease her risk of future health problems.  AGREE: Multiple dietary modification options and treatment options were discussed and  Meghan Diaz agreed to follow the recommendations documented in the above note.  ARRANGE: Meghan Diaz was educated on the importance of frequent visits to treat obesity as outlined per CMS and USPSTF guidelines and agreed to schedule her next follow up appointment today.  Fernanda DrumI, Denise Haag, am acting as Energy managertranscriptionist for [employer] , DO  I have reviewed the above documentation for accuracy and completeness, and I agree with the above. -Corinna CapraAngel , DO

## 2019-06-25 ENCOUNTER — Ambulatory Visit (INDEPENDENT_AMBULATORY_CARE_PROVIDER_SITE_OTHER): Payer: Medicare HMO | Admitting: Bariatrics

## 2019-06-25 ENCOUNTER — Encounter (INDEPENDENT_AMBULATORY_CARE_PROVIDER_SITE_OTHER): Payer: Self-pay

## 2019-07-01 ENCOUNTER — Ambulatory Visit (INDEPENDENT_AMBULATORY_CARE_PROVIDER_SITE_OTHER): Payer: Medicare HMO | Admitting: Bariatrics

## 2019-10-22 ENCOUNTER — Ambulatory Visit (INDEPENDENT_AMBULATORY_CARE_PROVIDER_SITE_OTHER): Payer: Medicare HMO | Admitting: Bariatrics

## 2019-10-22 ENCOUNTER — Other Ambulatory Visit: Payer: Self-pay

## 2019-10-22 VITALS — BP 125/62 | HR 57 | Temp 98.1°F | Ht 67.0 in | Wt 173.0 lb

## 2019-10-22 DIAGNOSIS — E559 Vitamin D deficiency, unspecified: Secondary | ICD-10-CM | POA: Diagnosis not present

## 2019-10-22 DIAGNOSIS — E669 Obesity, unspecified: Secondary | ICD-10-CM

## 2019-10-22 DIAGNOSIS — I10 Essential (primary) hypertension: Secondary | ICD-10-CM | POA: Diagnosis not present

## 2019-10-22 DIAGNOSIS — Z6830 Body mass index (BMI) 30.0-30.9, adult: Secondary | ICD-10-CM

## 2019-10-22 MED ORDER — VITAMIN D3 1.25 MG (50000 UT) PO CAPS: 1.0000 | ORAL_CAPSULE | ORAL | 0 refills | Status: DC

## 2019-10-26 ENCOUNTER — Encounter (INDEPENDENT_AMBULATORY_CARE_PROVIDER_SITE_OTHER): Payer: Self-pay | Admitting: Bariatrics

## 2019-10-26 NOTE — Progress Notes (Signed)
Chief Complaint:   OBESITY Meghan Diaz is here to discuss her progress with her obesity treatment plan along with follow-up of her obesity related diagnoses. Meghan Diaz is on the Category 2 Plan and states she is following her eating plan approximately 80% of the time. Meghan Diaz states she is walking 2.5-3.5 miles 7 times per week.  Today's visit was #: 23 Starting weight: 212 lbs Starting date: 03/20/2018 Today's weight: 173 lbs Today's date: 10/22/2019 Total lbs lost to date: 39 Total lbs lost since last in-office visit: 0  Interim History: Meghan Diaz has maintained her weight and has done well overall (she thought she had gained weight). She has received her COVID vaccines. She states she has done some overeating, but has walked more. She is thinking about going back to the gym. She has eliminated popcorn and is getting in protein and water daily.  Subjective:   Vitamin D deficiency. Meghan Diaz is on prescription Vitamin D 50,000 IU weekly. Last Vitamin D 35.6 on 02/05/2019.  Essential hypertension. Blood pressure is well controlled today.  BP Readings from Last 3 Encounters:  10/22/19 125/62  06/11/19 126/68  05/28/19 136/69   Lab Results  Component Value Date   CREATININE 0.83 02/05/2019   CREATININE 0.81 08/12/2018   CREATININE 0.81 03/20/2018   Assessment/Plan:   Vitamin D deficiency. Low Vitamin D level contributes to fatigue and are associated with obesity, breast, and colon cancer. She was given a refill on her Cholecalciferol (VITAMIN D3) 1.25 MG (50000 UT) CAPS every week #4 with 0 refills and will follow-up for routine testing of Vitamin D at her next appointment.   Essential hypertension. Meghan Diaz is working on healthy weight loss and exercise to improve blood pressure control. We will watch for signs of hypotension as she continues her lifestyle modifications. She will continue her medication as directed. Will recheck labs at her next appointment.  Class 1 obesity with  serious comorbidity and body mass index (BMI) of 30.0 to 30.9 in adult, unspecified obesity type - BMI greater than 30 start of program.   Meghan Diaz is currently in the action stage of change. As such, her goal is to continue with weight loss efforts. She has agreed to the Category 2 Plan.   She will work on meal planning.  Exercise goals: Older adults should follow the adult guidelines. When older adults cannot meet the adult guidelines, they should be as physically active as their abilities and conditions will allow.   Behavioral modification strategies: increasing lean protein intake, decreasing simple carbohydrates, increasing vegetables, increasing water intake and meal planning and cooking strategies.  Meghan Diaz has agreed to follow-up with our clinic in 3 weeks. She was informed of the importance of frequent follow-up visits to maximize her success with intensive lifestyle modifications for her multiple health conditions.   Objective:   Blood pressure 125/62, pulse (!) 57, temperature 98.1 F (36.7 C), height 5\' 7"  (1.702 m), weight 173 lb (78.5 kg), SpO2 99 %. Body mass index is 27.1 kg/m.  General: Cooperative, alert, well developed, in no acute distress. HEENT: Conjunctivae and lids unremarkable. Cardiovascular: Regular rhythm.  Lungs: Normal work of breathing. Neurologic: No focal deficits.   Lab Results  Component Value Date   CREATININE 0.83 02/05/2019   BUN 15 02/05/2019   NA 143 02/05/2019   K 5.4 (H) 02/05/2019   CL 103 02/05/2019   CO2 24 02/05/2019   Lab Results  Component Value Date   ALT 12 02/05/2019   AST  18 02/05/2019   ALKPHOS 75 02/05/2019   BILITOT 0.5 02/05/2019   Lab Results  Component Value Date   HGBA1C 5.5 02/05/2019   HGBA1C 5.5 08/12/2018   HGBA1C 5.6 03/20/2018   Lab Results  Component Value Date   INSULIN 6.8 02/05/2019   INSULIN 17.5 08/12/2018   INSULIN 10.4 03/20/2018   No results found for: TSH Lab Results  Component Value Date    CHOL 200 (H) 08/12/2018   HDL 64 08/12/2018   LDLCALC 117 (H) 08/12/2018   TRIG 95 08/12/2018   No results found for: WBC, HGB, HCT, MCV, PLT No results found for: IRON, TIBC, FERRITIN  Obesity Behavioral Intervention Documentation for Insurance:   Approximately 15 minutes were spent on the discussion below.  ASK: We discussed the diagnosis of obesity with Meghan Diaz today and Meghan Diaz agreed to give [place] permission to discuss obesity behavioral modification therapy today.  ASSESS: Meghan Diaz has the diagnosis of obesity and her BMI today is 27.2. Meghan Diaz is in the action stage of change.   ADVISE: Meghan Diaz was educated on the multiple health risks of obesity as well as the benefit of weight loss to improve her health. She was advised of the need for long term treatment and the importance of lifestyle modifications to improve her current health and to decrease her risk of future health problems.  AGREE: Multiple dietary modification options and treatment options were discussed and Meghan Diaz agreed to follow the recommendations documented in the above note.  ARRANGE: Meghan Diaz was educated on the importance of frequent visits to treat obesity as outlined per CMS and USPSTF guidelines and agreed to schedule her next follow up appointment today.  Attestation Statements:   Reviewed by clinician on day of visit: allergies, medications, problem list, medical history, surgical history, family history, social history, and previous encounter notes.  Meghan Diaz, am acting as Energy manager for [employer], DO   I have reviewed the above documentation for accuracy and completeness, and I agree with the above. Corinna Capra, DO

## 2019-11-09 ENCOUNTER — Ambulatory Visit (INDEPENDENT_AMBULATORY_CARE_PROVIDER_SITE_OTHER): Payer: Medicare HMO | Admitting: Bariatrics

## 2019-12-10 ENCOUNTER — Encounter (INDEPENDENT_AMBULATORY_CARE_PROVIDER_SITE_OTHER): Payer: Self-pay | Admitting: Bariatrics

## 2019-12-10 ENCOUNTER — Other Ambulatory Visit: Payer: Self-pay

## 2019-12-10 ENCOUNTER — Ambulatory Visit (INDEPENDENT_AMBULATORY_CARE_PROVIDER_SITE_OTHER): Payer: Medicare HMO | Admitting: Bariatrics

## 2019-12-10 VITALS — BP 145/69 | HR 61 | Temp 97.7°F | Ht 67.0 in | Wt 182.0 lb

## 2019-12-10 DIAGNOSIS — E669 Obesity, unspecified: Secondary | ICD-10-CM

## 2019-12-10 DIAGNOSIS — E038 Other specified hypothyroidism: Secondary | ICD-10-CM

## 2019-12-10 DIAGNOSIS — B379 Candidiasis, unspecified: Secondary | ICD-10-CM

## 2019-12-10 DIAGNOSIS — Z6830 Body mass index (BMI) 30.0-30.9, adult: Secondary | ICD-10-CM | POA: Diagnosis not present

## 2019-12-10 DIAGNOSIS — I10 Essential (primary) hypertension: Secondary | ICD-10-CM | POA: Diagnosis not present

## 2019-12-10 MED ORDER — LOSARTAN POTASSIUM-HCTZ 100-12.5 MG PO TABS: 1.0000 | ORAL_TABLET | Freq: Every day | ORAL | 2 refills | Status: DC

## 2019-12-10 MED ORDER — FLUCONAZOLE 150 MG PO TABS: 150.0000 mg | ORAL_TABLET | Freq: Once | ORAL | 0 refills | Status: AC

## 2019-12-10 MED ORDER — LEVOTHYROXINE SODIUM 75 MCG PO TABS: 75.0000 ug | ORAL_TABLET | Freq: Every day | ORAL | 2 refills | Status: DC

## 2019-12-10 NOTE — Progress Notes (Signed)
Chief Complaint:   OBESITY Meghan Diaz is here to discuss her progress with her obesity treatment plan along with follow-up of her obesity related diagnoses. Meghan Diaz is on the Category 2 Plan and states she is following her eating plan approximately 0% of the time. Meghan Diaz states she is walking 3-5 miles 7 times per week and working with weights 60 minutes 3 times per week.  Today's visit was #: 24 Starting weight: 212 lbs Starting date: 03/20/2018 Today's weight: 182 lbs Today's date: 12/10/2019 Total lbs lost to date: 30 Total lbs lost since last in-office visit: 0  Interim History: Meghan Diaz is up 9 lbs, but has done well overall.  Subjective:   Essential hypertension. Meghan Diaz is taking Hyzaar. Blood pressure is reasonably well controlled.  BP Readings from Last 3 Encounters:  12/10/19 (!) 145/69  10/22/19 125/62  06/11/19 126/68   Lab Results  Component Value Date   CREATININE 0.83 02/05/2019   CREATININE 0.81 08/12/2018   CREATININE 0.81 03/20/2018   Other specified hypothyroidism. Hypothyroidism is stable.   No results found for: TSH  Candidiasis. Meghan Diaz has erythema noted on the left side of her pannus.  Assessment/Plan:   Essential hypertension. Meghan Diaz is working on healthy weight loss and exercise to improve blood pressure control. We will watch for signs of hypotension as she continues her lifestyle  modifications. Refill was given for losartan-hydrochlorothiazide (HYZAAR) 100-12.5 MG tablet 1 PO daily #30 with 2 refills. Comprehensive metabolic panel, Lipid Panel With LDL/HDL Ratio labs ordered today.  Other specified hypothyroidism. Patient with long-standing hypothyroidism, on levothyroxine therapy. She appears euthyroid. Orders and follow up as documented in patient record. Prescription was given for levothyroxine (SYNTHROID) 75 MCG tablet 1 PO daily #30 with 2 refills. VITAMIN D 25 Hydroxy (Vit-D Deficiency, Fractures), T3, T4, free, TSH labs ordered  today.  Counseling . Good thyroid control is important for overall health. Supratherapeutic thyroid levels are dangerous and will not improve weight loss results. . The correct way to take levothyroxine is fasting, with water, separated by at least 30 minutes from breakfast, and separated by more than 4 hours from calcium, iron, multivitamins, acid reflux medications (PPIs).    Candidiasis. Meghan Diaz will use Neosporin. Prescription was given for fluconazole (DIFLUCAN) 150 MG tablet 1 PO x1 dose.  Class 1 obesity with serious comorbidity and body mass index (BMI) of 30.0 to 30.9 in adult, unspecified obesity type - BMI greater than 30 @ start.  Meghan Diaz is currently in the action stage of change. As such, her goal is to continue with weight loss efforts. She has agreed to the Category 2 Plan.   She will work on meal planning and intentional eating. She will avoid buffets and foods that trigger sweets.  Exercise goals: Older adults should follow the adult guidelines. When older adults cannot meet the adult guidelines, they should be as physically active as their abilities and conditions will allow.   Behavioral modification strategies: increasing lean protein intake, decreasing simple carbohydrates, increasing vegetables, increasing water intake, decreasing eating out, no skipping meals, meal planning and cooking strategies and keeping healthy foods in the home.  Meghan Diaz has agreed to follow-up with our clinic in 2-3 weeks. She was informed of the importance of frequent follow-up visits to maximize her success with intensive lifestyle modifications for her multiple health conditions.   Meghan Diaz was informed we would discuss her lab results at her next visit unless there is a critical issue that needs to be addressed sooner.  Meghan Diaz agreed to keep her next visit at the agreed upon time to discuss these results.  Objective:   Blood pressure (!) 145/69, pulse 61, temperature 97.7 F (36.5 C), height 5'  7" (1.702 m), weight 182 lb (82.6 kg), SpO2 100 %. Body mass index is 28.51 kg/m.  General: Cooperative, alert, well developed, in no acute distress. HEENT: Conjunctivae and lids unremarkable. Cardiovascular: Regular rhythm.  Lungs: Normal work of breathing. Neurologic: No focal deficits.   Lab Results  Component Value Date   CREATININE 0.83 02/05/2019   BUN 15 02/05/2019   NA 143 02/05/2019   K 5.4 (H) 02/05/2019   CL 103 02/05/2019   CO2 24 02/05/2019   Lab Results  Component Value Date   ALT 12 02/05/2019   AST 18 02/05/2019   ALKPHOS 75 02/05/2019   BILITOT 0.5 02/05/2019   Lab Results  Component Value Date   HGBA1C 5.5 02/05/2019   HGBA1C 5.5 08/12/2018   HGBA1C 5.6 03/20/2018   Lab Results  Component Value Date   INSULIN 6.8 02/05/2019   INSULIN 17.5 08/12/2018   INSULIN 10.4 03/20/2018   No results found for: TSH Lab Results  Component Value Date   CHOL 200 (H) 08/12/2018   HDL 64 08/12/2018   LDLCALC 117 (H) 08/12/2018   TRIG 95 08/12/2018   No results found for: WBC, HGB, HCT, MCV, PLT No results found for: IRON, TIBC, FERRITIN  Obesity Behavioral Intervention Documentation for Insurance:   Approximately 15 minutes were spent on the discussion below.  ASK: We discussed the diagnosis of obesity with Meghan Diaz today and Meghan Diaz agreed to give [place] permission to discuss obesity behavioral modification therapy today.  ASSESS: Meghan Diaz has the diagnosis of obesity and her BMI today is 28.6. Meghan Diaz is in the action stage of change.   ADVISE: Meghan Diaz was educated on the multiple health risks of obesity as well as the benefit of weight loss to improve her health. She was advised of the need for long term treatment and the importance of lifestyle modifications to improve her current health and to decrease her risk of future health problems.  AGREE: Multiple dietary modification options and treatment options were discussed and Meghan Diaz agreed to follow the  recommendations documented in the above note.  ARRANGE: Meghan Diaz was educated on the importance of frequent visits to treat obesity as outlined per CMS and USPSTF guidelines and agreed to schedule her next follow up appointment today.  Attestation Statements:   Reviewed by clinician on day of visit: allergies, medications, problem list, medical history, surgical history, family history, social history, and previous encounter notes.  Migdalia Dk, am acting as Location manager for [employer], DO   I have reviewed the above documentation for accuracy and completeness, and I agree with the above. Jearld Lesch, DO

## 2019-12-11 LAB — COMPREHENSIVE METABOLIC PANEL
ALT: 13 IU/L (ref 0–32)
AST: 22 IU/L (ref 0–40)
Albumin/Globulin Ratio: 2.4 — ABNORMAL HIGH (ref 1.2–2.2)
Albumin: 4.6 g/dL (ref 3.7–4.7)
Alkaline Phosphatase: 87 IU/L (ref 48–121)
BUN/Creatinine Ratio: 33 — ABNORMAL HIGH (ref 12–28)
BUN: 27 mg/dL (ref 8–27)
Bilirubin Total: 0.3 mg/dL (ref 0.0–1.2)
CO2: 25 mmol/L (ref 20–29)
Calcium: 9.5 mg/dL (ref 8.7–10.3)
Chloride: 100 mmol/L (ref 96–106)
Creatinine, Ser: 0.83 mg/dL (ref 0.57–1.00)
GFR calc Af Amer: 79 mL/min/{1.73_m2} (ref >59)
GFR calc non Af Amer: 69 mL/min/{1.73_m2} (ref >59)
Globulin, Total: 1.9 g/dL (ref 1.5–4.5)
Glucose: 79 mg/dL (ref 65–99)
Potassium: 4.5 mmol/L (ref 3.5–5.2)
Sodium: 138 mmol/L (ref 134–144)
Total Protein: 6.5 g/dL (ref 6.0–8.5)

## 2019-12-11 LAB — LIPID PANEL WITH LDL/HDL RATIO
Cholesterol, Total: 246 mg/dL — ABNORMAL HIGH (ref 100–199)
HDL: 99 mg/dL (ref >39)
LDL Chol Calc (NIH): 136 mg/dL — ABNORMAL HIGH (ref 0–99)
LDL/HDL Ratio: 1.4 ratio (ref 0.0–3.2)
Triglycerides: 69 mg/dL (ref 0–149)
VLDL Cholesterol Cal: 11 mg/dL (ref 5–40)

## 2019-12-11 LAB — TSH: TSH: 2.16 u[IU]/mL (ref 0.450–4.500)

## 2019-12-11 LAB — VITAMIN D 25 HYDROXY (VIT D DEFICIENCY, FRACTURES): Vit D, 25-Hydroxy: 35.6 ng/mL (ref 30.0–100.0)

## 2019-12-11 LAB — T3: T3, Total: 67 ng/dL — ABNORMAL LOW (ref 71–180)

## 2019-12-11 LAB — T4, FREE: Free T4: 1.16 ng/dL (ref 0.82–1.77)

## 2019-12-30 ENCOUNTER — Other Ambulatory Visit: Payer: Self-pay

## 2019-12-30 ENCOUNTER — Encounter (INDEPENDENT_AMBULATORY_CARE_PROVIDER_SITE_OTHER): Payer: Self-pay | Admitting: Bariatrics

## 2019-12-30 ENCOUNTER — Ambulatory Visit (INDEPENDENT_AMBULATORY_CARE_PROVIDER_SITE_OTHER): Payer: Medicare HMO | Admitting: Bariatrics

## 2019-12-30 VITALS — BP 135/68 | HR 59 | Temp 97.9°F | Ht 67.0 in | Wt 185.0 lb

## 2019-12-30 DIAGNOSIS — E038 Other specified hypothyroidism: Secondary | ICD-10-CM

## 2019-12-30 DIAGNOSIS — E559 Vitamin D deficiency, unspecified: Secondary | ICD-10-CM

## 2019-12-30 DIAGNOSIS — E669 Obesity, unspecified: Secondary | ICD-10-CM

## 2019-12-30 DIAGNOSIS — Z6830 Body mass index (BMI) 30.0-30.9, adult: Secondary | ICD-10-CM | POA: Diagnosis not present

## 2019-12-30 MED ORDER — VITAMIN D3 1.25 MG (50000 UT) PO CAPS: 1.0000 | ORAL_CAPSULE | ORAL | 0 refills | Status: DC

## 2019-12-31 ENCOUNTER — Encounter (INDEPENDENT_AMBULATORY_CARE_PROVIDER_SITE_OTHER): Payer: Self-pay | Admitting: Bariatrics

## 2019-12-31 NOTE — Progress Notes (Signed)
Chief Complaint:   OBESITY Meghan Diaz is here to discuss her progress with her obesity treatment plan along with follow-up of her obesity related diagnoses. Meghan Diaz is on the Category 2 Plan and states she is following her eating plan approximately 50% of the time. Meghan Diaz states she is walking 3 miles 7 times per week.  Today's visit was #: 25 Starting weight: 212 lbs Starting date: 03/20/2018 Today's weight: 185 lbs Today's date: 12/30/2019 Total lbs lost to date: 27 Total lbs lost since last in-office visit: 0  Interim History: Meghan Diaz's weight is up 3 lbs, but she states that she has enjoyed her time with family. She is up almost 3 lbs of water per the bioimpedance scale.  Subjective:   Other specified hypothyroidism. Meghan Diaz is on Synthroid. Levels are well controlled.  Lab Results  Component Value Date   TSH 2.160 12/10/2019   Vitamin D deficiency. No nausea, vomiting, or muscle weakness. Last Vitamin D was 35.6 on 12/10/2019.  Assessment/Plan:   Other specified hypothyroidism. Patient with long-standing hypothyroidism, on levothyroxine therapy. She appears euthyroid. Orders and follow up as documented in patient record. Meghan Diaz will continue her medication as directed. We reviewed with her thyroid panel labs.  Counseling . Good thyroid control is important for overall health. Supratherapeutic thyroid levels are dangerous and will not improve weight loss results. . The correct way to take levothyroxine is fasting, with water, separated by at least 30 minutes from breakfast, and separated by more than 4 hours from calcium, iron, multivitamins, acid reflux medications (PPIs).   Vitamin D deficiency. Low Vitamin D level contributes to fatigue and are associated with obesity, breast, and colon cancer. She was given a prescription for Cholecalciferol (VITAMIN D3) 1.25 MG (50000 UT) CAPS every week #4 with 0 refills and will follow-up for routine testing of Vitamin D, at  least 2-3 times per year to avoid over-replacement.   Class 1 obesity with serious comorbidity and body mass index (BMI) of 30.0 to 30.9 in adult, unspecified obesity type - BMI greater than 30 at start.  Meghan Diaz is currently in the action stage of change. As such, her goal is to continue with weight loss efforts. She has agreed to the Category 2 Plan.   She will work on meal planning, intentional eating, and decreasing sweets.  We reviewed with the patient labs from 12/10/2019 including CMP, lipids, Vitamin D, and thyroid panel.  Exercise goals: Meghan Diaz will increase her walking.  Behavioral modification strategies: increasing lean protein intake, decreasing simple carbohydrates, increasing vegetables, increasing water intake, decreasing eating out, no skipping meals, meal planning and cooking strategies, keeping healthy foods in the home and planning for success.  Meghan Diaz has agreed to follow-up with our clinic in 2 weeks. She was informed of the importance of frequent follow-up visits to maximize her success with intensive lifestyle modifications for her multiple health conditions.   Objective:   Blood pressure 135/68, pulse (!) 59, temperature 97.9 F (36.6 C), height 5\' 7"  (1.702 m), weight 185 lb (83.9 kg), SpO2 97 %. Body mass index is 28.98 kg/m.  General: Cooperative, alert, well developed, in no acute distress. HEENT: Conjunctivae and lids unremarkable. Cardiovascular: Regular rhythm.  Lungs: Normal work of breathing. Neurologic: No focal deficits.   Lab Results  Component Value Date   CREATININE 0.83 12/10/2019   BUN 27 12/10/2019   NA 138 12/10/2019   K 4.5 12/10/2019   CL 100 12/10/2019   CO2 25 12/10/2019  Lab Results  Component Value Date   ALT 13 12/10/2019   AST 22 12/10/2019   ALKPHOS 87 12/10/2019   BILITOT 0.3 12/10/2019   Lab Results  Component Value Date   HGBA1C 5.5 02/05/2019   HGBA1C 5.5 08/12/2018   HGBA1C 5.6 03/20/2018   Lab Results   Component Value Date   INSULIN 6.8 02/05/2019   INSULIN 17.5 08/12/2018   INSULIN 10.4 03/20/2018   Lab Results  Component Value Date   TSH 2.160 12/10/2019   Lab Results  Component Value Date   CHOL 246 (H) 12/10/2019   HDL 99 12/10/2019   LDLCALC 136 (H) 12/10/2019   TRIG 69 12/10/2019   No results found for: WBC, HGB, HCT, MCV, PLT No results found for: IRON, TIBC, FERRITIN  Obesity Behavioral Intervention Documentation for Insurance:   Approximately 15 minutes were spent on the discussion below.  ASK: We discussed the diagnosis of obesity with Meghan Diaz today and Meghan Diaz agreed to give [place] permission to discuss obesity behavioral modification therapy today.  ASSESS: Meghan Diaz has the diagnosis of obesity and her BMI today is 29.0. Meghan Diaz is in the action stage of change.   ADVISE: Meghan Diaz was educated on the multiple health risks of obesity as well as the benefit of weight loss to improve her health. She was advised of the need for long term treatment and the importance of lifestyle modifications to improve her current health and to decrease her risk of future health problems.  AGREE: Multiple dietary modification options and treatment options were discussed and Meghan Diaz agreed to follow the recommendations documented in the above note.  ARRANGE: Meghan Diaz was educated on the importance of frequent visits to treat obesity as outlined per CMS and USPSTF guidelines and agreed to schedule her next follow up appointment today.  Attestation Statements:   Reviewed by clinician on day of visit: allergies, medications, problem list, medical history, surgical history, family history, social history, and previous encounter notes.  Meghan Diaz, am acting as Energy manager for [employer], DO   I have reviewed the above documentation for accuracy and completeness, and I agree with the above. -  Meghan Diaz

## 2020-01-07 ENCOUNTER — Encounter (INDEPENDENT_AMBULATORY_CARE_PROVIDER_SITE_OTHER): Payer: Self-pay

## 2020-01-12 ENCOUNTER — Ambulatory Visit (INDEPENDENT_AMBULATORY_CARE_PROVIDER_SITE_OTHER): Payer: Medicare HMO | Admitting: Bariatrics

## 2020-01-12 ENCOUNTER — Encounter (INDEPENDENT_AMBULATORY_CARE_PROVIDER_SITE_OTHER): Payer: Self-pay | Admitting: Bariatrics

## 2020-01-12 ENCOUNTER — Other Ambulatory Visit: Payer: Self-pay

## 2020-01-12 VITALS — BP 171/82 | HR 67 | Temp 98.0°F | Ht 67.0 in | Wt 188.0 lb

## 2020-01-12 DIAGNOSIS — Z6830 Body mass index (BMI) 30.0-30.9, adult: Secondary | ICD-10-CM | POA: Diagnosis not present

## 2020-01-12 DIAGNOSIS — I10 Essential (primary) hypertension: Secondary | ICD-10-CM

## 2020-01-12 DIAGNOSIS — F419 Anxiety disorder, unspecified: Secondary | ICD-10-CM | POA: Diagnosis not present

## 2020-01-12 DIAGNOSIS — E669 Obesity, unspecified: Secondary | ICD-10-CM

## 2020-01-12 NOTE — Progress Notes (Signed)
Chief Complaint:   OBESITY Meghan Diaz is here to discuss her progress with her obesity treatment plan along with follow-up of her obesity related diagnoses. Meghan Diaz is on the Category 2 Plan and states she is following her eating plan approximately 35% of the time. Meghan Diaz states she is walking 3 miles 7 times per week.  Today's visit was #: 26 Starting weight: 212 lbs Starting date: 03/20/2018 Today's weight: 188 lbs Today's date: 01/12/2020 Total lbs lost to date: 24 Total lbs lost since last in-office visit: 0  Interim History: Meghan Diaz is up 3 lbs since her last visit, but has done well overall. She reports her anxiety has been higher and she is eating more sugar.  Subjective:   Essential hypertension. Meghan Diaz is taking Hyzaar. Blood pressure is elevated with anxiety.  BP Readings from Last 3 Encounters:  01/12/20 (!) 171/82  12/30/19 135/68  12/10/19 (!) 145/69   Lab Results  Component Value Date   CREATININE 0.83 12/10/2019   CREATININE 0.83 02/05/2019   CREATININE 0.81 08/12/2018   Anxiety. Meghan Diaz has a history of anxiety and reports anxiety is increased slightly due to multiple situations. No homicidal or suicidal ideations.  Assessment/Plan:   Essential hypertension. Meghan Diaz is working on healthy weight loss and exercise to improve blood pressure control. We will watch for signs of hypotension as she continues her lifestyle modifications.  She will continue her medication as directed.   Anxiety. Meghan Diaz will take Xanax at night for sleep and will increase her water and protein intake.  Class 1 obesity with serious comorbidity and body mass index (BMI) of 30.0 to 30.9 in adult, unspecified obesity type - BMI greater than 30 at start.  Meghan Diaz is currently in the action stage of change. As such, her goal is to continue with weight loss efforts. She has agreed to the Category 2 Plan.   She will work on meal planning, intentional eating, and will not bring "sweets"  into the house.  Exercise goals: Meghan Diaz will continue going to the gym and walking.  Behavioral modification strategies: increasing lean protein intake, decreasing simple carbohydrates, increasing vegetables, increasing water intake, decreasing eating out, no skipping meals, meal planning and cooking strategies, keeping healthy foods in the home and planning for success.  Meghan Diaz has agreed to follow-up with our clinic in 2 weeks. She was informed of the importance of frequent follow-up visits to maximize her success with intensive lifestyle modifications for her multiple health conditions.   Objective:   Blood pressure (!) 171/82, pulse 67, temperature 98 F (36.7 C), height 5\' 7"  (1.702 m), weight 188 lb (85.3 kg), SpO2 98 %. Body mass index is 29.44 kg/m.  General: Cooperative, alert, well developed, in no acute distress. HEENT: Conjunctivae and lids unremarkable. Cardiovascular: Regular rhythm.  Lungs: Normal work of breathing. Neurologic: No focal deficits.   Lab Results  Component Value Date   CREATININE 0.83 12/10/2019   BUN 27 12/10/2019   NA 138 12/10/2019   K 4.5 12/10/2019   CL 100 12/10/2019   CO2 25 12/10/2019   Lab Results  Component Value Date   ALT 13 12/10/2019   AST 22 12/10/2019   ALKPHOS 87 12/10/2019   BILITOT 0.3 12/10/2019   Lab Results  Component Value Date   HGBA1C 5.5 02/05/2019   HGBA1C 5.5 08/12/2018   HGBA1C 5.6 03/20/2018   Lab Results  Component Value Date   INSULIN 6.8 02/05/2019   INSULIN 17.5 08/12/2018   INSULIN 10.4  03/20/2018   Lab Results  Component Value Date   TSH 2.160 12/10/2019   Lab Results  Component Value Date   CHOL 246 (H) 12/10/2019   HDL 99 12/10/2019   LDLCALC 136 (H) 12/10/2019   TRIG 69 12/10/2019   No results found for: WBC, HGB, HCT, MCV, PLT No results found for: IRON, TIBC, FERRITIN  Obesity Behavioral Intervention Documentation for Insurance:   Approximately 15 minutes were spent on the  discussion below.  ASK: We discussed the diagnosis of obesity with Meghan Diaz today and Meghan Diaz agreed to give [place] permission to discuss obesity behavioral modification therapy today.  ASSESS: Meghan Diaz has the diagnosis of obesity and her BMI today is 29.5. Meghan Diaz is in the action stage of change.   ADVISE: Meghan Diaz was educated on the multiple health risks of obesity as well as the benefit of weight loss to improve her health. She was advised of the need for long term treatment and the importance of lifestyle modifications to improve her current health and to decrease her risk of future health problems.  AGREE: Multiple dietary modification options and treatment options were discussed and Meghan Diaz agreed to follow the recommendations documented in the above note.  ARRANGE: Meghan Diaz was educated on the importance of frequent visits to treat obesity as outlined per CMS and USPSTF guidelines and agreed to schedule her next follow up appointment today.  Attestation Statements:   Reviewed by clinician on day of visit: allergies, medications, problem list, medical history, surgical history, family history, social history, and previous encounter notes.  Fernanda Drum, am acting as Energy manager for [employer], DO   I have reviewed the above documentation for accuracy and completeness, and I agree with the above. Corinna Capra, DO

## 2020-01-28 ENCOUNTER — Ambulatory Visit (INDEPENDENT_AMBULATORY_CARE_PROVIDER_SITE_OTHER): Payer: Medicare HMO | Admitting: Bariatrics

## 2020-01-28 ENCOUNTER — Other Ambulatory Visit: Payer: Self-pay

## 2020-01-28 ENCOUNTER — Encounter (INDEPENDENT_AMBULATORY_CARE_PROVIDER_SITE_OTHER): Payer: Self-pay | Admitting: Bariatrics

## 2020-01-28 VITALS — BP 135/78 | HR 68 | Temp 98.7°F | Ht 67.0 in | Wt 182.0 lb

## 2020-01-28 DIAGNOSIS — I10 Essential (primary) hypertension: Secondary | ICD-10-CM

## 2020-01-28 DIAGNOSIS — Z6830 Body mass index (BMI) 30.0-30.9, adult: Secondary | ICD-10-CM

## 2020-01-28 DIAGNOSIS — E559 Vitamin D deficiency, unspecified: Secondary | ICD-10-CM

## 2020-01-28 DIAGNOSIS — E669 Obesity, unspecified: Secondary | ICD-10-CM

## 2020-01-28 DIAGNOSIS — E7800 Pure hypercholesterolemia, unspecified: Secondary | ICD-10-CM

## 2020-01-28 MED ORDER — VITAMIN D3 1.25 MG (50000 UT) PO CAPS: 1.0000 | ORAL_CAPSULE | ORAL | 0 refills | Status: DC

## 2020-01-28 NOTE — Progress Notes (Signed)
Chief Complaint:   OBESITY Meghan Diaz is here to discuss her progress with her obesity treatment plan along with follow-up of her obesity related diagnoses. Meghan Diaz is on the Category 2 Plan and states she is following her eating plan approximately 75-80% of the time. Meghan Diaz states she is walking 3 miles 7 times per week.  Today's visit was #: 27 Starting weight: 212 lbs Starting date: 03/20/2018 Today's weight: 182 lbs Today's date: 01/28/2020 Total lbs lost to date: 30 Total lbs lost since last in-office visit: 6  Interim History: Meghan Diaz is down 6 lbs and has gotten back on track.  Subjective:   Essential hypertension. Meghan Diaz is taking Hyzaar. Blood pressure is controlled.  BP Readings from Last 3 Encounters:  01/28/20 135/78  01/12/20 (!) 171/82  12/30/19 135/68   Lab Results  Component Value Date   CREATININE 0.83 12/10/2019   CREATININE 0.83 02/05/2019   CREATININE 0.81 08/12/2018   Vitamin D deficiency. No nausea, vomiting, or muscle weakness.    Ref. Range 12/10/2019 15:38  Vitamin D, 25-Hydroxy Latest Ref Range: 30.0 - 100.0 ng/mL 35.6   Elevated cholesterol. Cholesterol was elevated at 246 on 12/10/2019.  Assessment/Plan:   Essential hypertension. Meghan Diaz is working on healthy weight loss and exercise to improve blood pressure control. We will watch for signs of hypotension as she continues her lifestyle modifications. She will continue taking Hyzaar as directed and will minimize her intake of salt.  Vitamin D deficiency. Low Vitamin D level contributes to fatigue and are associated with obesity, breast, and colon cancer. She was given a prescription for Cholecalciferol (VITAMIN D3) 1.25 MG (50000 UT) CAPS every week #4 with 0 refills and will follow-up for routine testing of Vitamin D, at least 2-3 times per year to avoid over-replacement.   Elevated cholesterol. Meghan Diaz will begin Red Yeast Rice and PCP will check liver enzymes every 6 months.  Class 1  obesity with serious comorbidity and body mass index (BMI) of 30.0 to 30.9 in adult, unspecified obesity type - BMI greater than 30 at start.  Meghan Diaz is currently in the action stage of change. As such, her goal is to continue with weight loss efforts. She has agreed to the Category 2 Plan.   She will work on meal planning, intentional eating, and increasing her protein intake.   Exercise goals: Older adults should follow the adult guidelines. When older adults cannot meet the adult guidelines, they should be as physically active as their abilities and conditions will allow.   Behavioral modification strategies: increasing lean protein intake, decreasing simple carbohydrates, increasing vegetables, increasing water intake, decreasing eating out, no skipping meals, meal planning and cooking strategies, keeping healthy foods in the home, ways to avoid boredom eating, ways to avoid night time snacking, better snacking choices, emotional eating strategies, celebration eating strategies, avoiding temptations and planning for success.  Meghan Diaz has agreed to follow-up with our clinic in 2 weeks. She was informed of the importance of frequent follow-up visits to maximize her success with intensive lifestyle modifications for her multiple health conditions.   Objective:   Blood pressure 135/78, pulse 68, temperature 98.7 F (37.1 C), height 5\' 7"  (1.702 m), weight 182 lb (82.6 kg), SpO2 98 %. Body mass index is 28.51 kg/m.  General: Cooperative, alert, well developed, in no acute distress. HEENT: Conjunctivae and lids unremarkable. Cardiovascular: Regular rhythm.  Lungs: Normal work of breathing. Neurologic: No focal deficits.   Lab Results  Component Value Date  CREATININE 0.83 12/10/2019   BUN 27 12/10/2019   NA 138 12/10/2019   K 4.5 12/10/2019   CL 100 12/10/2019   CO2 25 12/10/2019   Lab Results  Component Value Date   ALT 13 12/10/2019   AST 22 12/10/2019   ALKPHOS 87 12/10/2019    BILITOT 0.3 12/10/2019   Lab Results  Component Value Date   HGBA1C 5.5 02/05/2019   HGBA1C 5.5 08/12/2018   HGBA1C 5.6 03/20/2018   Lab Results  Component Value Date   INSULIN 6.8 02/05/2019   INSULIN 17.5 08/12/2018   INSULIN 10.4 03/20/2018   Lab Results  Component Value Date   TSH 2.160 12/10/2019   Lab Results  Component Value Date   CHOL 246 (H) 12/10/2019   HDL 99 12/10/2019   LDLCALC 136 (H) 12/10/2019   TRIG 69 12/10/2019   No results found for: WBC, HGB, HCT, MCV, PLT No results found for: IRON, TIBC, FERRITIN  Obesity Behavioral Intervention Documentation for Insurance:   Approximately 15 minutes were spent on the discussion below.  ASK: We discussed the diagnosis of obesity with Bert today and Kaysie agreed to give [place] permission to discuss obesity behavioral modification therapy today.  ASSESS: Meghan Diaz has the diagnosis of obesity and her BMI today is 28.5. Meghan Diaz is in the action stage of change.   ADVISE: Meghan Diaz was educated on the multiple health risks of obesity as well as the benefit of weight loss to improve her health. She was advised of the need for long term treatment and the importance of lifestyle modifications to improve her current health and to decrease her risk of future health problems.  AGREE: Multiple dietary modification options and treatment options were discussed and Meghan Diaz agreed to follow the recommendations documented in the above note.  ARRANGE: Meghan Diaz was educated on the importance of frequent visits to treat obesity as outlined per CMS and USPSTF guidelines and agreed to schedule her next follow up appointment today.  Attestation Statements:   Reviewed by clinician on day of visit: allergies, medications, problem list, medical history, surgical history, family history, social history, and previous encounter notes.  Fernanda Drum, am acting as Energy manager for [employer], DO   I have reviewed the above documentation for  accuracy and completeness, and I agree with the above. Corinna Capra, DO

## 2020-02-10 ENCOUNTER — Ambulatory Visit (INDEPENDENT_AMBULATORY_CARE_PROVIDER_SITE_OTHER): Payer: Medicare HMO | Admitting: Physician Assistant

## 2020-03-01 ENCOUNTER — Encounter (INDEPENDENT_AMBULATORY_CARE_PROVIDER_SITE_OTHER): Payer: Self-pay | Admitting: Bariatrics

## 2020-03-01 ENCOUNTER — Other Ambulatory Visit: Payer: Self-pay

## 2020-03-01 ENCOUNTER — Ambulatory Visit (INDEPENDENT_AMBULATORY_CARE_PROVIDER_SITE_OTHER): Payer: Medicare HMO | Admitting: Bariatrics

## 2020-03-01 VITALS — BP 135/85 | HR 65 | Temp 98.7°F | Ht 67.0 in | Wt 195.0 lb

## 2020-03-01 DIAGNOSIS — E038 Other specified hypothyroidism: Secondary | ICD-10-CM | POA: Diagnosis not present

## 2020-03-01 DIAGNOSIS — I10 Essential (primary) hypertension: Secondary | ICD-10-CM | POA: Diagnosis not present

## 2020-03-01 DIAGNOSIS — Z6830 Body mass index (BMI) 30.0-30.9, adult: Secondary | ICD-10-CM | POA: Diagnosis not present

## 2020-03-01 DIAGNOSIS — E559 Vitamin D deficiency, unspecified: Secondary | ICD-10-CM | POA: Diagnosis not present

## 2020-03-01 DIAGNOSIS — E669 Obesity, unspecified: Secondary | ICD-10-CM | POA: Diagnosis not present

## 2020-03-01 DIAGNOSIS — L659 Nonscarring hair loss, unspecified: Secondary | ICD-10-CM

## 2020-03-01 DIAGNOSIS — F3289 Other specified depressive episodes: Secondary | ICD-10-CM

## 2020-03-01 MED ORDER — VITAMIN D3 1.25 MG (50000 UT) PO CAPS: 1.0000 | ORAL_CAPSULE | ORAL | 0 refills | Status: DC

## 2020-03-01 MED ORDER — SERTRALINE HCL 25 MG PO TABS: 25.0000 mg | ORAL_TABLET | Freq: Every day | ORAL | 0 refills | Status: DC

## 2020-03-01 NOTE — Progress Notes (Signed)
Chief Complaint:   OBESITY Meghan Diaz is here to discuss her progress with her obesity treatment plan along with follow-up of her obesity related diagnoses. Meghan Diaz is on the Category 2 Plan and states she is following her eating plan approximately 0% of the time. Meghan Diaz states she is exercising 0 minutes 0 times per week.  Today's visit was #: 28 Starting weight: 212 lbs Starting date: 03/20/2018 Today's weight: 195 lbs Today's date: 03/01/2020 Total lbs lost to date: 17 Total lbs lost since last in-office visit: 0  Interim History: Meghan Diaz is up 13 lbs after going on vacation. She has also been eating more after 7:00 p.m.  Subjective:   Other specified hypothyroidism. Meghan Diaz is taking Synthroid.   Lab Results  Component Value Date   TSH 2.160 12/10/2019   Essential hypertension. Loie is taking Hyzaar.  BP Readings from Last 3 Encounters:  03/01/20 135/85  01/28/20 135/78  01/12/20 (!) 171/82   Lab Results  Component Value Date   CREATININE 0.83 12/10/2019   CREATININE 0.83 02/05/2019   CREATININE 0.81 08/12/2018   Hair loss. Meghan Diaz reports noting hair loss more recently.  Vitamin D deficiency. No nausea, vomiting, or muscle weakness.   Ref. Range 12/10/2019 15:38  Vitamin D, 25-Hydroxy Latest Ref Range: 30.0 - 100.0 ng/mL 35.6   Other depression, emotional eating. Meghan Diaz is struggling with emotional eating and using food for comfort to the extent that it is negatively impacting her health. She has been working on behavior modification techniques to help reduce her emotional eating and has been somewhat successful. She shows no sign of suicidal or homicidal ideations. Meghan Diaz endorses depression.  Assessment/Plan:   Other specified hypothyroidism. Patient with long-standing hypothyroidism, on levothyroxine therapy. She appears euthyroid. Orders and follow up as documented in patient record. Meghan Diaz will continue Synthroid as directed.   Counseling . Good  thyroid control is important for overall health. Supratherapeutic thyroid levels are dangerous and will not improve weight loss results. . The correct way to take levothyroxine is fasting, with water, separated by at least 30 minutes from breakfast, and separated by more than 4 hours from calcium, iron, multivitamins, acid reflux medications (PPIs).   Essential hypertension. Meghan Diaz is working on healthy weight loss and exercise to improve blood pressure control. We will watch for signs of hypotension as she continues her lifestyle modifications. She will continue Hyzaar as directed.   Hair loss. Meghan Diaz was advised to take Biotin, Zinc, and Omega-3 fatty acids.  Vitamin D deficiency. Low Vitamin D level contributes to fatigue and are associated with obesity, breast, and colon cancer. She was given a prescription for Cholecalciferol (VITAMIN D3) 1.25 MG (50000 UT) CAPS every week #4 with 0 refills and will follow-up for routine testing of Vitamin D, at least 2-3 times per year to avoid over-replacement.   Other depression, emotional eating. Behavior modification techniques were discussed today to help Meghan Diaz deal with her emotional/non-hunger eating behaviors.  Orders and follow up as documented in patient record. Prescription was given for sertraline (ZOLOFT) 25 MG tablet 1 PO daily #30 with 0 refills.  Class 1 obesity with serious comorbidity and body mass index (BMI) of 30.0 to 30.9 in adult, unspecified obesity type.  Meghan Diaz is currently in the action stage of change. As such, her goal is to continue with weight loss efforts. She has agreed to the Category 2 Plan.   She will resume the plan and will be more adherent. She will work on meal  planning and intentional eating.   Exercise goals: Meghan Diaz will continue going to the pool and will walk for exercise.  Behavioral modification strategies: increasing lean protein intake, decreasing simple carbohydrates, increasing vegetables, increasing water  intake, decreasing eating out, no skipping meals, meal planning and cooking strategies, keeping healthy foods in the home and planning for success.  Meghan Diaz has agreed to follow-up with our clinic in 1 week. She was informed of the importance of frequent follow-up visits to maximize her success with intensive lifestyle modifications for her multiple health conditions.   Objective:   Blood pressure 135/85, pulse 65, temperature 98.7 F (37.1 C), height 5\' 7"  (1.702 m), weight 195 lb (88.5 kg), SpO2 98 %. Body mass index is 30.54 kg/m.  General: Cooperative, alert, well developed, in no acute distress. HEENT: Conjunctivae and lids unremarkable. Cardiovascular: Regular rhythm.  Lungs: Normal work of breathing. Neurologic: No focal deficits.   Lab Results  Component Value Date   CREATININE 0.83 12/10/2019   BUN 27 12/10/2019   NA 138 12/10/2019   K 4.5 12/10/2019   CL 100 12/10/2019   CO2 25 12/10/2019   Lab Results  Component Value Date   ALT 13 12/10/2019   AST 22 12/10/2019   ALKPHOS 87 12/10/2019   BILITOT 0.3 12/10/2019   Lab Results  Component Value Date   HGBA1C 5.5 02/05/2019   HGBA1C 5.5 08/12/2018   HGBA1C 5.6 03/20/2018   Lab Results  Component Value Date   INSULIN 6.8 02/05/2019   INSULIN 17.5 08/12/2018   INSULIN 10.4 03/20/2018   Lab Results  Component Value Date   TSH 2.160 12/10/2019   Lab Results  Component Value Date   CHOL 246 (H) 12/10/2019   HDL 99 12/10/2019   LDLCALC 136 (H) 12/10/2019   TRIG 69 12/10/2019   No results found for: WBC, HGB, HCT, MCV, PLT No results found for: IRON, TIBC, FERRITIN  Obesity Behavioral Intervention Documentation for Insurance:   Approximately 15 minutes were spent on the discussion below.  ASK: We discussed the diagnosis of obesity with Meghan Diaz today and Meghan Diaz agreed to give 02/09/2020 permission to discuss obesity behavioral modification therapy today.  ASSESS: Meghan Diaz has the diagnosis of obesity and her BMI  today is 30.6. Meghan Diaz is in the action stage of change.   ADVISE: Meghan Diaz was educated on the multiple health risks of obesity as well as the benefit of weight loss to improve her health. She was advised of the need for long term treatment and the importance of lifestyle modifications to improve her current health and to decrease her risk of future health problems.  AGREE: Multiple dietary modification options and treatment options were discussed and Meghan Diaz agreed to follow the recommendations documented in the above note.  ARRANGE:Meghan Diaz was educated on the importance of frequent visits to treat obesity as outlined per CMS and USPSTF guidelines and agreed to schedule her next follo up appointment today.  Attestation Statements:   Reviewed by clinician on day of visit: allergies, medications, problem list, medical history, surgical history, family history, social history, and previous encounter notes.  Inez Catalina, am acting as Fernanda Drum for Energy manager, DO   I have reviewed the above documentation for accuracy and completeness, and I agree with the above. [employer], DO

## 2020-03-08 ENCOUNTER — Encounter (INDEPENDENT_AMBULATORY_CARE_PROVIDER_SITE_OTHER): Payer: Self-pay | Admitting: Bariatrics

## 2020-03-08 ENCOUNTER — Other Ambulatory Visit: Payer: Self-pay

## 2020-03-08 ENCOUNTER — Ambulatory Visit (INDEPENDENT_AMBULATORY_CARE_PROVIDER_SITE_OTHER): Payer: Medicare HMO | Admitting: Bariatrics

## 2020-03-08 VITALS — BP 140/85 | HR 74 | Temp 98.4°F | Ht 67.0 in | Wt 190.0 lb

## 2020-03-08 DIAGNOSIS — I10 Essential (primary) hypertension: Secondary | ICD-10-CM

## 2020-03-08 DIAGNOSIS — F32A Depression, unspecified: Secondary | ICD-10-CM

## 2020-03-08 DIAGNOSIS — F329 Major depressive disorder, single episode, unspecified: Secondary | ICD-10-CM

## 2020-03-08 DIAGNOSIS — Z6830 Body mass index (BMI) 30.0-30.9, adult: Secondary | ICD-10-CM | POA: Diagnosis not present

## 2020-03-08 DIAGNOSIS — E669 Obesity, unspecified: Secondary | ICD-10-CM

## 2020-03-08 DIAGNOSIS — F3289 Other specified depressive episodes: Secondary | ICD-10-CM

## 2020-03-08 NOTE — Progress Notes (Signed)
Chief Complaint:   OBESITY Meghan Diaz is here to discuss her progress with her obesity treatment plan along with follow-up of her obesity related diagnoses. Meghan Diaz is on the Category 2 Plan and states she is following her eating plan approximately 80% of the time. Meghan Diaz states she is walking 3 miles 1 time per week.  Today's visit was #: 29 Starting weight: 212 lbs Starting date: 03/20/2018 Today's weight: 190 lbs Today's date: 03/08/2020 Total lbs lost to date: 22 Total lbs lost since last in-office visit: 5  Interim History: Meghan Diaz is down 5 lbs and has done well overall.  Subjective:   Other depression, emotional eating. Meghan Diaz is struggling with emotional eating and using food for comfort to the extent that it is negatively impacting her health. She has been working on behavior modification techniques to help reduce her emotional eating and has been somewhat successful. She shows no sign of suicidal or homicidal ideations. Meghan Diaz was started on Zoloft 25 mg at her last visit for emotional/stress eating.  Essential hypertension.  Meghan Diaz is taking Hyzaar. Blood pressure is reasonably well controlled.  BP Readings from Last 3 Encounters:  03/08/20 140/85  03/01/20 135/85  01/28/20 135/78   Lab Results  Component Value Date   CREATININE 0.83 12/10/2019   CREATININE 0.83 02/05/2019   CREATININE 0.81 08/12/2018   Assessment/Plan:   Other depression, emotional eating. Behavior modification techniques were discussed today to help Meghan Diaz deal with her emotional/non-hunger eating behaviors.  Orders and follow up as documented in patient record. Meghan Diaz will continue Zoloft as directed.   Essential hypertension. Meghan Diaz is working on healthy weight loss and exercise to improve blood pressure control. We will watch for signs of hypotension as she continues her lifestyle modifications. She will continue her medication as directed.   Class 1 obesity with serious comorbidity  and body mass index (BMI) of 30.0 to 30.9 in adult, unspecified obesity type.  Meghan Diaz is currently in the action stage of change. As such, her goal is to continue with weight loss efforts. She has agreed to the Category 2 Plan.   She will work on meal planning and intentional eating.   Exercise goals: Meghan Diaz will resume walking for exercise.  Behavioral modification strategies: increasing lean protein intake, decreasing simple carbohydrates, increasing vegetables, increasing water intake, decreasing eating out, no skipping meals, meal planning and cooking strategies, keeping healthy foods in the home and planning for success.  Meghan Diaz has agreed to follow-up with our clinic fasting in 2-3 weeks. She was informed of the importance of frequent follow-up visits to maximize her success with intensive lifestyle modifications for her multiple health conditions.   Objective:   Blood pressure 140/85, pulse 74, temperature 98.4 F (36.9 C), height 5\' 7"  (1.702 m), weight 190 lb (86.2 kg), SpO2 96 %. Body mass index is 29.76 kg/m.  General: Cooperative, alert, well developed, in no acute distress. HEENT: Conjunctivae and lids unremarkable. Cardiovascular: Regular rhythm.  Lungs: Normal work of breathing. Neurologic: No focal deficits.   Lab Results  Component Value Date   CREATININE 0.83 12/10/2019   BUN 27 12/10/2019   NA 138 12/10/2019   K 4.5 12/10/2019   CL 100 12/10/2019   CO2 25 12/10/2019   Lab Results  Component Value Date   ALT 13 12/10/2019   AST 22 12/10/2019   ALKPHOS 87 12/10/2019   BILITOT 0.3 12/10/2019   Lab Results  Component Value Date   HGBA1C 5.5 02/05/2019   HGBA1C  5.5 08/12/2018   HGBA1C 5.6 03/20/2018   Lab Results  Component Value Date   INSULIN 6.8 02/05/2019   INSULIN 17.5 08/12/2018   INSULIN 10.4 03/20/2018   Lab Results  Component Value Date   TSH 2.160 12/10/2019   Lab Results  Component Value Date   CHOL 246 (H) 12/10/2019   HDL 99  12/10/2019   LDLCALC 136 (H) 12/10/2019   TRIG 69 12/10/2019   No results found for: WBC, HGB, HCT, MCV, PLT No results found for: IRON, TIBC, FERRITIN  Obesity Behavioral Intervention Documentation for Insurance:   Approximately 15 minutes were spent on the discussion below.  ASK: We discussed the diagnosis of obesity with Meghan Diaz today and Meghan Diaz agreed to give [place] permission to discuss obesity behavioral modification therapy today.  ASSESS: Meghan Diaz has the diagnosis of obesity and her BMI today is 29.8. Meghan Diaz is in the action stage of change.   ADVISE: Meghan Diaz was educated on the multiple health risks of obesity as well as the benefit of weight loss to improve her health. She was advised of the need for long term treatment and the importance of lifestyle modifications to improve her current health and to decrease her risk of future health problems.  AGREE: Multiple dietary modification options and treatment options were discussed and Meghan Diaz agreed to follow the recommendations documented in the above note.  ARRANGE: Meghan Diaz was educated on the importance of frequent visits to treat obesity as outlined per CMS and USPSTF guidelines and agreed to schedule her next follow up appointment today.  Attestation Statements:   Reviewed by clinician on day of visit: allergies, medications, problem list, medical history, surgical history, family history, social history, and previous encounter notes.  Fernanda Drum, am acting as Energy manager for [employer], DO   I have reviewed the above documentation for accuracy and completeness, and I agree with the above. Corinna Capra, DO

## 2020-03-22 ENCOUNTER — Encounter (INDEPENDENT_AMBULATORY_CARE_PROVIDER_SITE_OTHER): Payer: Self-pay | Admitting: Bariatrics

## 2020-03-22 ENCOUNTER — Ambulatory Visit (INDEPENDENT_AMBULATORY_CARE_PROVIDER_SITE_OTHER): Payer: Medicare HMO | Admitting: Bariatrics

## 2020-03-22 ENCOUNTER — Other Ambulatory Visit: Payer: Self-pay

## 2020-03-22 VITALS — BP 128/72 | HR 61 | Temp 97.9°F | Ht 67.0 in | Wt 195.0 lb

## 2020-03-22 DIAGNOSIS — E559 Vitamin D deficiency, unspecified: Secondary | ICD-10-CM

## 2020-03-22 DIAGNOSIS — E669 Obesity, unspecified: Secondary | ICD-10-CM | POA: Diagnosis not present

## 2020-03-22 DIAGNOSIS — Z6830 Body mass index (BMI) 30.0-30.9, adult: Secondary | ICD-10-CM | POA: Diagnosis not present

## 2020-03-22 DIAGNOSIS — F3289 Other specified depressive episodes: Secondary | ICD-10-CM

## 2020-03-22 DIAGNOSIS — E038 Other specified hypothyroidism: Secondary | ICD-10-CM | POA: Diagnosis not present

## 2020-03-22 MED ORDER — SERTRALINE HCL 25 MG PO TABS: 25.0000 mg | ORAL_TABLET | Freq: Every day | ORAL | 0 refills | Status: DC

## 2020-03-22 MED ORDER — LEVOTHYROXINE SODIUM 75 MCG PO TABS: 75.0000 ug | ORAL_TABLET | Freq: Every day | ORAL | 2 refills | Status: DC

## 2020-03-22 MED ORDER — VITAMIN D3 1.25 MG (50000 UT) PO CAPS: 1.0000 | ORAL_CAPSULE | ORAL | 0 refills | Status: DC

## 2020-03-28 ENCOUNTER — Other Ambulatory Visit (INDEPENDENT_AMBULATORY_CARE_PROVIDER_SITE_OTHER): Payer: Self-pay | Admitting: Bariatrics

## 2020-03-28 ENCOUNTER — Encounter (INDEPENDENT_AMBULATORY_CARE_PROVIDER_SITE_OTHER): Payer: Self-pay | Admitting: Bariatrics

## 2020-03-28 DIAGNOSIS — F3289 Other specified depressive episodes: Secondary | ICD-10-CM

## 2020-03-28 NOTE — Progress Notes (Signed)
Chief Complaint:   OBESITY Meghan Diaz is here to discuss her progress with her obesity treatment plan along with follow-up of her obesity related diagnoses. Meghan Diaz is on the Category 2 Plan and states she is following her eating plan approximately 0% of the time. Meghan Diaz states she is walking for 1 mile 3 times per week.  Today's visit was #: 30 Starting weight: 212 lbs Starting date: 03/20/2018 Today's weight: 195 lbs Today's date: 03/22/2020 Total lbs lost to date: 7 lbs Total lbs lost since last in-office visit: 0  Interim History: Meghan Diaz is up 5 pounds since her last visit, but has done well overall.  Subjective:   1. Vitamin D deficiency Meghan Diaz's Vitamin D level was 35.6 on 12/10/2019. She is currently taking prescription vitamin D 50,000 IU each week. She denies nausea, vomiting or muscle weakness.  She is getting some sun exposure.  2. Other specified hypothyroidism Meghan Diaz is taking levothyroxine 75 mcg daily.  Controlled.  Lab Results  Component Value Date   TSH 2.160 12/10/2019   3. Other depression, emotional eating She is doing well with her medication.  She has increased stress.  Assessment/Plan:   1. Vitamin D deficiency Low Vitamin D level contributes to fatigue and are associated with obesity, breast, and colon cancer. She agrees to continue to take prescription Vitamin D @50 ,000 IU every week and will follow-up for routine testing of Vitamin D, at least 2-3 times per year to avoid over-replacement.    -Refill Cholecalciferol (VITAMIN D3) 1.25 MG (50000 UT) CAPS; Take 1 Dose by mouth once a week.  Dispense: 4 capsule; Refill: 0  2. Other specified hypothyroidism Patient with long-standing hypothyroidism, on levothyroxine therapy. She appears euthyroid. Orders and follow up as documented in patient record.    Counseling . Good thyroid control is important for overall health. Supratherapeutic thyroid levels are dangerous and will not improve weight loss  results. . The correct way to take levothyroxine is fasting, with water, separated by at least 30 minutes from breakfast, and separated by more than 4 hours from calcium, iron, multivitamins, acid reflux medications (PPIs).   -Refill levothyroxine (SYNTHROID) 75 MCG tablet; Take 1 tablet (75 mcg total) by mouth daily before breakfast.  Dispense: 30 tablet; Refill: 2  3. Other depression, emotional eating Will refill sertraline today and place referral for counseling.  -Refill sertraline (ZOLOFT) 25 MG tablet; Take 1 tablet (25 mg total) by mouth daily.  Dispense: 30 tablet; Refill: 0 - Ambulatory referral to Behavioral Health  4. Class 1 obesity with serious comorbidity and body mass index (BMI) of 30.0 to 30.9 in adult, unspecified obesity type Meghan Diaz is currently in the action stage of change. As such, her goal is to continue with weight loss efforts. She has agreed to the Category 2 Plan.   She will work on meal planning, decreasing carbohydrates, decreasing eating out, and mindful eating.  Exercise goals: Continue walking.  Behavioral modification strategies: increasing lean protein intake, decreasing simple carbohydrates, increasing vegetables, increasing water intake, decreasing eating out, no skipping meals, meal planning and cooking strategies, keeping healthy foods in the home and planning for success.  Meghan Diaz has agreed to follow-up with our clinic in 2 weeks. She was informed of the importance of frequent follow-up visits to maximize her success with intensive lifestyle modifications for her multiple health conditions.   Objective:   Blood pressure 128/72, pulse 61, temperature 97.9 F (36.6 C), height 5\' 7"  (1.702 m), weight 195 lb (88.5 kg), SpO2  97 %. Body mass index is 30.54 kg/m.  General: Cooperative, alert, well developed, in no acute distress. HEENT: Conjunctivae and lids unremarkable. Cardiovascular: Regular rhythm.  Lungs: Normal work of breathing. Neurologic: No  focal deficits.   Lab Results  Component Value Date   CREATININE 0.83 12/10/2019   BUN 27 12/10/2019   NA 138 12/10/2019   K 4.5 12/10/2019   CL 100 12/10/2019   CO2 25 12/10/2019   Lab Results  Component Value Date   ALT 13 12/10/2019   AST 22 12/10/2019   ALKPHOS 87 12/10/2019   BILITOT 0.3 12/10/2019   Lab Results  Component Value Date   HGBA1C 5.5 02/05/2019   HGBA1C 5.5 08/12/2018   HGBA1C 5.6 03/20/2018   Lab Results  Component Value Date   INSULIN 6.8 02/05/2019   INSULIN 17.5 08/12/2018   INSULIN 10.4 03/20/2018   Lab Results  Component Value Date   TSH 2.160 12/10/2019   Lab Results  Component Value Date   CHOL 246 (H) 12/10/2019   HDL 99 12/10/2019   LDLCALC 136 (H) 12/10/2019   TRIG 69 12/10/2019   Obesity Behavioral Intervention:   Approximately 15 minutes were spent on the discussion below.  ASK: We discussed the diagnosis of obesity with Meghan Diaz today and Meghan Diaz agreed to give [place] permission to discuss obesity behavioral modification therapy today.  ASSESS: Meghan Diaz has the diagnosis of obesity and her BMI today is 30.6. Meghan Diaz is in the action stage of change.   ADVISE: Meghan Diaz was educated on the multiple health risks of obesity as well as the benefit of weight loss to improve her health. She was advised of the need for long term treatment and the importance of lifestyle modifications to improve her current health and to decrease her risk of future health problems.  AGREE: Multiple dietary modification options and treatment options were discussed and Meghan Diaz agreed to follow the recommendations documented in the above note.  ARRANGE: Meghan Diaz was educated on the importance of frequent visits to treat obesity as outlined per CMS and USPSTF guidelines and agreed to schedule her next follow up appointment today.  Attestation Statements:   Reviewed by clinician on day of visit: allergies, medications, problem list, medical history, surgical history,  family history, social history, and previous encounter notes.  I, Insurance claims handler, CMA, am acting as Energy manager for [employer], DO  I have reviewed the above documentation for accuracy and completeness, and I agree with the above. Corinna Capra, DO

## 2020-04-05 ENCOUNTER — Ambulatory Visit (INDEPENDENT_AMBULATORY_CARE_PROVIDER_SITE_OTHER): Payer: Medicare HMO | Admitting: Bariatrics

## 2020-04-18 ENCOUNTER — Other Ambulatory Visit: Payer: Self-pay

## 2020-04-18 ENCOUNTER — Encounter (INDEPENDENT_AMBULATORY_CARE_PROVIDER_SITE_OTHER): Payer: Self-pay | Admitting: Bariatrics

## 2020-04-18 ENCOUNTER — Ambulatory Visit (INDEPENDENT_AMBULATORY_CARE_PROVIDER_SITE_OTHER): Payer: Medicare HMO | Admitting: Bariatrics

## 2020-04-18 VITALS — BP 134/78 | HR 61 | Temp 97.6°F | Ht 67.0 in | Wt 197.0 lb

## 2020-04-18 DIAGNOSIS — E559 Vitamin D deficiency, unspecified: Secondary | ICD-10-CM | POA: Diagnosis not present

## 2020-04-18 DIAGNOSIS — E669 Obesity, unspecified: Secondary | ICD-10-CM

## 2020-04-18 DIAGNOSIS — E038 Other specified hypothyroidism: Secondary | ICD-10-CM | POA: Diagnosis not present

## 2020-04-18 DIAGNOSIS — Z6831 Body mass index (BMI) 31.0-31.9, adult: Secondary | ICD-10-CM | POA: Diagnosis not present

## 2020-04-18 MED ORDER — VITAMIN D3 1.25 MG (50000 UT) PO CAPS: 1.0000 | ORAL_CAPSULE | ORAL | 0 refills | Status: DC

## 2020-04-18 MED ORDER — LEVOTHYROXINE SODIUM 75 MCG PO TABS: 75.0000 ug | ORAL_TABLET | Freq: Every day | ORAL | 2 refills | Status: DC

## 2020-04-18 NOTE — Progress Notes (Signed)
Chief Complaint:   OBESITY Meghan Diaz is here to discuss her progress with her obesity treatment plan along with follow-up of her obesity related diagnoses. Meghan Diaz is on the Category 2 Plan and states she is following her eating plan approximately 50% of the time. Meghan Diaz states she is exercising 0 minutes 0 times per week.  Today's visit was #: 31 Starting weight: 212 lbs Starting date: 03/20/2018 Today's weight: 197 lbs Today's date: 04/18/2020 Total lbs lost to date: 15 Total lbs lost since last in-office visit: 0  Interim History: Meghan Diaz is up an additional 2 lbs since her last visit. She has stopped the Zoloft due to increased hunger per the patient.  Subjective:   Vitamin D deficiency. Meghan Diaz reports some sun exposure, but minimal.   Ref. Range 12/10/2019 15:38  Vitamin D, 25-Hydroxy Latest Ref Range: 30.0 - 100.0 ng/mL 35.6   Other specified hypothyroidism. Meghan Diaz is taking synthroid.   Lab Results  Component Value Date   TSH 2.160 12/10/2019   Assessment/Plan:   Vitamin D deficiency. Low Vitamin D level contributes to fatigue and are associated with obesity, breast, and colon cancer. She was given a prescription for Cholecalciferol (VITAMIN D3) 1.25 MG (50000 UT) CAPS every week #4 with 0 refills and will follow-up for routine testing of Vitamin D, at least 2-3 times per year to avoid over-replacement.   Other specified hypothyroidism. Patient with long-standing hypothyroidism, on levothyroxine therapy. She appears euthyroid. Orders and follow up as documented in patient record. Prescription was given for levothyroxine (SYNTHROID) 75 MCG tablet 1 daily before breakfast #30 with 2 refills.  Counseling . Good thyroid control is important for overall health. Supratherapeutic thyroid levels are dangerous and will not improve weight loss results.  . The correct way to take levothyroxine is fasting, with water, separated by at least 30 minutes from breakfast, and  separated by more than 4 hours from calcium, iron, multivitamins, acid reflux medications (PPIs).   Class 1 obesity with serious comorbidity and body mass index (BMI) of 31.0 to 31.9 in adult, unspecified obesity type.  Meghan Diaz is currently in the action stage of change. As such, her goal is to continue with weight loss efforts. She has agreed to the Category 2 Plan alternating with the Pescatarian Plan.   She will work on meal planning and intentional eating.   Handout was provided on Protein Sources.  Exercise goals: Meghan Diaz will continue to walk for exercise.  Behavioral modification strategies: increasing lean protein intake, decreasing simple carbohydrates, increasing vegetables, increasing water intake, decreasing eating out, no skipping meals, meal planning and cooking strategies, keeping healthy foods in the home and planning for success.  Meghan Diaz has agreed to follow-up with our clinic in 3 weeks. She was informed of the importance of frequent follow-up visits to maximize her success with intensive lifestyle modifications for her multiple health conditions.   Objective:   Blood pressure 134/78, pulse 61, temperature 97.6 F (36.4 C), height 5\' 7"  (1.702 m), weight 197 lb (89.4 kg), SpO2 99 %. Body mass index is 30.85 kg/m.  General: Cooperative, alert, well developed, in no acute distress. HEENT: Conjunctivae and lids unremarkable. Cardiovascular: Regular rhythm.  Lungs: Normal work of breathing. Neurologic: No focal deficits.   Lab Results  Component Value Date   CREATININE 0.83 12/10/2019   BUN 27 12/10/2019   NA 138 12/10/2019   K 4.5 12/10/2019   CL 100 12/10/2019   CO2 25 12/10/2019   Lab Results  Component  Value Date   ALT 13 12/10/2019   AST 22 12/10/2019   ALKPHOS 87 12/10/2019   BILITOT 0.3 12/10/2019   Lab Results  Component Value Date   HGBA1C 5.5 02/05/2019   HGBA1C 5.5 08/12/2018   HGBA1C 5.6 03/20/2018   Lab Results  Component Value Date    INSULIN 6.8 02/05/2019   INSULIN 17.5 08/12/2018   INSULIN 10.4 03/20/2018   Lab Results  Component Value Date   TSH 2.160 12/10/2019   Lab Results  Component Value Date   CHOL 246 (H) 12/10/2019   HDL 99 12/10/2019   LDLCALC 136 (H) 12/10/2019   TRIG 69 12/10/2019   No results found for: WBC, HGB, HCT, MCV, PLT No results found for: IRON, TIBC, FERRITIN  Obesity Behavioral Intervention:   Approximately 15 minutes were spent on the discussion below.  ASK: We discussed the diagnosis of obesity with Meghan Diaz today and Meghan Diaz agreed to give [place] permission to discuss obesity behavioral modification therapy today.  ASSESS: Meghan Diaz has the diagnosis of obesity and her BMI today is 31.0. Meghan Diaz is in the action stage of change.   ADVISE: Meghan Diaz was educated on the multiple health risks of obesity as well as the benefit of weight loss to improve her health. She was advised of the need for long term treatment and the importance of lifestyle modifications to improve her current health and to decrease her risk of future health problems.  AGREE: Multiple dietary modification options and treatment options were discussed and Meghan Diaz agreed to follow the recommendations documented in the above note.  ARRANGE: Meghan Diaz was educated on the importance of frequent visits to treat obesity as outlined per CMS and USPSTF guidelines and agreed to schedule her next follow up appointment today.  Attestation Statements:   Reviewed by clinician on day of visit: allergies, medications, problem list, medical history, surgical history, family history, social history, and previous encounter notes.  Fernanda Drum, am acting as Energy manager for [employer], DO   I have reviewed the above documentation for accuracy and completeness, and I agree with the above. Corinna Capra, DO

## 2020-04-19 ENCOUNTER — Encounter (INDEPENDENT_AMBULATORY_CARE_PROVIDER_SITE_OTHER): Payer: Self-pay | Admitting: Bariatrics

## 2020-05-10 ENCOUNTER — Ambulatory Visit (INDEPENDENT_AMBULATORY_CARE_PROVIDER_SITE_OTHER): Payer: Medicare HMO | Admitting: Bariatrics

## 2020-05-24 ENCOUNTER — Encounter (INDEPENDENT_AMBULATORY_CARE_PROVIDER_SITE_OTHER): Payer: Self-pay | Admitting: Bariatrics

## 2020-05-24 ENCOUNTER — Other Ambulatory Visit: Payer: Self-pay

## 2020-05-24 ENCOUNTER — Ambulatory Visit (INDEPENDENT_AMBULATORY_CARE_PROVIDER_SITE_OTHER): Payer: Medicare HMO | Admitting: Bariatrics

## 2020-05-24 VITALS — BP 140/86 | HR 58 | Temp 97.7°F | Ht 67.0 in | Wt 208.0 lb

## 2020-05-24 DIAGNOSIS — E559 Vitamin D deficiency, unspecified: Secondary | ICD-10-CM

## 2020-05-24 DIAGNOSIS — E669 Obesity, unspecified: Secondary | ICD-10-CM

## 2020-05-24 DIAGNOSIS — F3289 Other specified depressive episodes: Secondary | ICD-10-CM

## 2020-05-24 DIAGNOSIS — Z6832 Body mass index (BMI) 32.0-32.9, adult: Secondary | ICD-10-CM | POA: Diagnosis not present

## 2020-05-24 MED ORDER — VITAMIN D3 1.25 MG (50000 UT) PO CAPS: 1.0000 | ORAL_CAPSULE | ORAL | 0 refills | Status: DC

## 2020-05-24 NOTE — Progress Notes (Signed)
Chief Complaint:   OBESITY Meghan Diaz is here to discuss her progress with her obesity treatment plan along with follow-up of her obesity related diagnoses. Meghan Diaz is on the Category 2 Plan alternating with the Pescatarian Plan and states she is following her eating plan approximately 0% of the time. Meghan Diaz states she is exercising 0 minutes 0 times per week.  Today's visit was #: 32 Starting weight: 212 lbs Starting date: 03/20/2018 Today's weight: 208 lbs Today's date: 05/24/2020 Total lbs lost to date: 4 Total lbs lost since last in-office visit: 0  Interim History: Meghan Diaz is up 11 lbs since her last visit. She does not have a good support system.  Subjective:   Vitamin D deficiency. No nausea, vomiting, or muscle weakness.    Ref. Range 12/10/2019 15:38  Vitamin D, 25-Hydroxy Latest Ref Range: 30.0 - 100.0 ng/mL 35.6   Other depression. Meghan Diaz is struggling with emotional eating and using food for comfort to the extent that it is negatively impacting her health. She has been working on behavior modification techniques to help reduce her emotional eating and has been somewhat successful. She shows no sign of suicidal or homicidal ideations. Meghan Diaz endorses anxiety and takes Xanax occasionally. She has tried several antidepressants, but is unable to tolerate.  Assessment/Plan:   Vitamin D deficiency. Low Vitamin D level contributes to fatigue and are associated with obesity, breast, and colon cancer. She was given a prescription for Cholecalciferol (VITAMIN D3) 1.25 MG (50000 UT) CAPS every week #4 with 0 refills and will follow-up for routine testing of Vitamin D, at least 2-3 times per year to avoid over-replacement.   Other depression. Behavior modification techniques were discussed today to help Meghan Diaz deal with her emotional/non-hunger eating behaviors.  Orders and follow up as documented in patient record. Meghan Diaz will do more things for herself (microblading eyebrows)  and will work on sleep hygiene. Ambulatory referral to Behavioral Health placed.  Class 1 obesity with serious comorbidity and body mass index (BMI) of 32.0 to 32.9 in adult, unspecified obesity type.  Meghan Diaz is currently in the action stage of change. As such, her goal is to continue with weight loss efforts. She has agreed to the Category 2 Plan alternating with the Pescatarian Plan.  She will work on meal planning and intentional eating.    Exercise goals: Meghan Diaz has not been walking as much and has gone back to the gym.  Behavioral modification strategies: increasing lean protein intake, decreasing simple carbohydrates, increasing vegetables, increasing water intake, decreasing eating out, no skipping meals, meal planning and cooking strategies, keeping healthy foods in the home, dealing with family or coworker sabotage, travel eating strategies, holiday eating strategies  and celebration eating strategies.  Meghan Diaz has agreed to follow-up with our clinic in 2 weeks. She was informed of the importance of frequent follow-up visits to maximize her success with intensive lifestyle modifications for her multiple health conditions.   Objective:   Blood pressure 140/86, pulse (!) 58, temperature 97.7 F (36.5 C), temperature source Oral, height 5\' 7"  (1.702 m), weight 208 lb (94.3 kg), SpO2 97 %. Body mass index is 32.58 kg/m.  General: Cooperative, alert, well developed, in no acute distress. HEENT: Conjunctivae and lids unremarkable. Cardiovascular: Regular rhythm.  Lungs: Normal work of breathing. Neurologic: No focal deficits.   Lab Results  Component Value Date   CREATININE 0.83 12/10/2019   BUN 27 12/10/2019   NA 138 12/10/2019   K 4.5 12/10/2019   CL  100 12/10/2019   CO2 25 12/10/2019   Lab Results  Component Value Date   ALT 13 12/10/2019   AST 22 12/10/2019   ALKPHOS 87 12/10/2019   BILITOT 0.3 12/10/2019   Lab Results  Component Value Date   HGBA1C 5.5 02/05/2019    HGBA1C 5.5 08/12/2018   HGBA1C 5.6 03/20/2018   Lab Results  Component Value Date   INSULIN 6.8 02/05/2019   INSULIN 17.5 08/12/2018   INSULIN 10.4 03/20/2018   Lab Results  Component Value Date   TSH 2.160 12/10/2019   Lab Results  Component Value Date   CHOL 246 (H) 12/10/2019   HDL 99 12/10/2019   LDLCALC 136 (H) 12/10/2019   TRIG 69 12/10/2019   No results found for: WBC, HGB, HCT, MCV, PLT No results found for: IRON, TIBC, FERRITIN  Obesity Behavioral Intervention:   Approximately 15 minutes were spent on the discussion below.  ASK: We discussed the diagnosis of obesity with Meghan Diaz today and Meghan Diaz agreed to give [place] permission to discuss obesity behavioral modification therapy today.  ASSESS: Meghan Diaz has the diagnosis of obesity and her BMI today is 32.7. Meghan Diaz is in the action stage of change.   ADVISE: Meghan Diaz was educated on the multiple health risks of obesity as well as the benefit of weight loss to improve her health. She was advised of the need for long term treatment and the importance of lifestyle modifications to improve her current health and to decrease her risk of future health problems.  AGREE: Multiple dietary modification options and treatment options were discussed and Meghan Diaz agreed to follow the recommendations documented in the above note.  ARRANGE: Meghan Diaz was educated on the importance of frequent visits to treat obesity as outlined per CMS and USPSTF guidelines and agreed to schedule her next follow up appointment today.  Attestation Statements:   Reviewed by clinician on day of visit: allergies, medications, problem list, medical history, surgical history, family history, social history, and previous encounter notes.  Meghan Diaz, am acting as Energy manager for [employer], DO   I have reviewed the above documentation for accuracy and completeness, and I agree with the above. Meghan Capra, DO

## 2020-05-25 ENCOUNTER — Telehealth: Payer: Self-pay | Admitting: Gastroenterology

## 2020-06-06 ENCOUNTER — Ambulatory Visit (INDEPENDENT_AMBULATORY_CARE_PROVIDER_SITE_OTHER): Payer: Medicare HMO | Admitting: Bariatrics

## 2020-07-14 ENCOUNTER — Other Ambulatory Visit: Payer: Self-pay

## 2020-07-14 ENCOUNTER — Ambulatory Visit (INDEPENDENT_AMBULATORY_CARE_PROVIDER_SITE_OTHER): Payer: Medicare HMO | Admitting: Bariatrics

## 2020-07-14 ENCOUNTER — Encounter (INDEPENDENT_AMBULATORY_CARE_PROVIDER_SITE_OTHER): Payer: Self-pay | Admitting: Bariatrics

## 2020-07-14 VITALS — BP 127/69 | Temp 97.9°F | Ht 67.0 in | Wt 220.0 lb

## 2020-07-14 DIAGNOSIS — F3289 Other specified depressive episodes: Secondary | ICD-10-CM

## 2020-07-14 DIAGNOSIS — I10 Essential (primary) hypertension: Secondary | ICD-10-CM | POA: Diagnosis not present

## 2020-07-14 DIAGNOSIS — E559 Vitamin D deficiency, unspecified: Secondary | ICD-10-CM

## 2020-07-14 DIAGNOSIS — E038 Other specified hypothyroidism: Secondary | ICD-10-CM | POA: Diagnosis not present

## 2020-07-14 DIAGNOSIS — Z6834 Body mass index (BMI) 34.0-34.9, adult: Secondary | ICD-10-CM | POA: Diagnosis not present

## 2020-07-14 DIAGNOSIS — E669 Obesity, unspecified: Secondary | ICD-10-CM | POA: Diagnosis not present

## 2020-07-14 MED ORDER — VITAMIN D (ERGOCALCIFEROL) 1.25 MG (50000 UNIT) PO CAPS: 50000.0000 [IU] | ORAL_CAPSULE | ORAL | 0 refills | Status: DC

## 2020-07-19 NOTE — Progress Notes (Signed)
Chief Complaint:   OBESITY Meghan Diaz is here to discuss her progress with her obesity treatment plan along with follow-up of her obesity related diagnoses. Meghan Diaz is on the Category 2 Plan alternating with the Pescatarian Plan and states she is following her eating plan approximately 0% of the time. Meghan Diaz states she is walking for 40 minutes 5 times per week.  Today's visit was #: 33 Starting weight: 212 lbs Starting date: 03/20/2018 Today's weight: 220 lbs Today's date: 07/14/2020 Total lbs lost to date: 0 Total lbs lost since last in-office visit: 0  Interim History: She is up 12 pounds since her last visit.  Subjective:   1. Essential hypertension Controlled.  Review: taking medications as instructed, no medication side effects noted, no chest pain on exertion, no dyspnea on exertion, no swelling of ankles.   BP Readings from Last 3 Encounters:  07/14/20 127/69  05/24/20 140/86  04/18/20 134/78   2. Other specified hypothyroidism Meghan Diaz is taking Synthroid 75 mcg daily.  Lab Results  Component Value Date   TSH 2.160 12/10/2019   3. Vitamin D deficiency Meghan Diaz's Vitamin D level was 35.6 on 12/10/2019. She is currently taking prescription vitamin D 50,000 IU each week. She denies nausea, vomiting or muscle weakness.  4. Other depression, with emotional eating Meghan Diaz is struggling with emotional eating and using food for comfort to the extent that it is negatively impacting her health. She has been working on behavior modification techniques to help reduce her emotional eating and has been minimally successful. She shows no sign of suicidal or homicidal ideations.  She does, however, have thoughts of self-hate at times.  Assessment/Plan:   1. Essential hypertension Meghan Diaz is working on healthy weight loss and exercise to improve blood pressure control. We will watch for signs of hypotension as she continues her lifestyle modifications.  She will continue her  medication.  2. Other specified hypothyroidism Patient with long-standing hypothyroidism, on levothyroxine therapy. She appears euthyroid. Orders and follow up as documented in patient record.  Meghan Diaz will continue her Synthroid.  Counseling . Good thyroid control is important for overall health. Supratherapeutic thyroid levels are dangerous and will not improve weight loss results. . The correct way to take levothyroxine is fasting, with water, separated by at least 30 minutes from breakfast, and separated by more than 4 hours from calcium, iron, multivitamins, acid reflux medications (PPIs).   3. Vitamin D deficiency Low Vitamin D level contributes to fatigue and are associated with obesity, breast, and colon cancer. She agrees to continue to take prescription Vitamin D @50 ,000 IU every week and will follow-up for routine testing of Vitamin D, at least 2-3 times per year to avoid over-replacement.  -Refill Vitamin D, Ergocalciferol, (DRISDOL) 1.25 MG (50000 UNIT) CAPS capsule; Take 1 capsule (50,000 Units total) by mouth every 7 (seven) days.  Dispense: 4 capsule; Refill: 0  4. Other depression, with emotional eating Behavior modification techniques were discussed today to help Meghan Diaz with her emotional/non-hunger eating behaviors.  Will place referral for counseling/Psychiatry today.  - Ambulatory referral to Behavioral Health  5. Class 1 obesity with serious comorbidity and body mass index (BMI) of 34.0 to 34.9 in adult, unspecified obesity type  Meghan Diaz is currently in the action stage of change. As such, her goal is to continue with weight loss efforts. She has agreed to the Category 2 Plan.   Meghan Diaz will work on meal planning, mindful eating, and will detox off sugar (increase water intake).  She will also increase her protein intake and stop bringing in snacks.  Exercise goals: Walking daily, lifting weights, and going to the gym.  Behavioral modification strategies: increasing  lean protein intake, decreasing simple carbohydrates, increasing vegetables, increasing water intake, decreasing eating out, no skipping meals, meal planning and cooking strategies, keeping healthy foods in the home and planning for success.  Meghan Diaz has agreed to follow-up with our clinic in 2-3 weeks, fasting. She was informed of the importance of frequent follow-up visits to maximize her success with intensive lifestyle modifications for her multiple health conditions.   Objective:   Blood pressure 127/69, temperature 97.9 F (36.6 C), temperature source Oral, height 5\' 7"  (1.702 m), weight 220 lb (99.8 kg), SpO2 98 %. Body mass index is 34.46 kg/m.  General: Cooperative, alert, well developed, in no acute distress. HEENT: Conjunctivae and lids unremarkable. Cardiovascular: Regular rhythm.  Lungs: Normal work of breathing. Neurologic: No focal deficits.   Lab Results  Component Value Date   CREATININE 0.83 12/10/2019   BUN 27 12/10/2019   NA 138 12/10/2019   K 4.5 12/10/2019   CL 100 12/10/2019   CO2 25 12/10/2019   Lab Results  Component Value Date   ALT 13 12/10/2019   AST 22 12/10/2019   ALKPHOS 87 12/10/2019   BILITOT 0.3 12/10/2019   Lab Results  Component Value Date   HGBA1C 5.5 02/05/2019   HGBA1C 5.5 08/12/2018   HGBA1C 5.6 03/20/2018   Lab Results  Component Value Date   INSULIN 6.8 02/05/2019   INSULIN 17.5 08/12/2018   INSULIN 10.4 03/20/2018   Lab Results  Component Value Date   TSH 2.160 12/10/2019   Lab Results  Component Value Date   CHOL 246 (H) 12/10/2019   HDL 99 12/10/2019   LDLCALC 136 (H) 12/10/2019   TRIG 69 12/10/2019   Obesity Behavioral Intervention:   Approximately 15 minutes were spent on the discussion below.  ASK: We discussed the diagnosis of obesity with Meghan Diaz today and Meghan Diaz agreed to give 02/09/2020 permission to discuss obesity behavioral modification therapy today.  ASSESS: Meghan Diaz has the diagnosis of obesity and her BMI  today is 34.5. Meghan Diaz is in the action stage of change.   ADVISE: Meghan Diaz was educated on the multiple health risks of obesity as well as the benefit of weight loss to improve her health. She was advised of the need for long term treatment and the importance of lifestyle modifications to improve her current health and to decrease her risk of future health problems.  AGREE: Multiple dietary modification options and treatment options were discussed and Meghan Diaz agreed to follow the recommendations documented in the above note.  ARRANGE: Meghan Diaz was educated on the importance of frequent visits to treat obesity as outlined per CMS and USPSTF guidelines and agreed to schedule her next follow up appointment today.  Attestation Statements:   Reviewed by clinician on day of visit: allergies, medications, problem list, medical history, surgical history, family history, social history, and previous encounter notes.  I, Inez Catalina, CMA, am acting as Insurance claims handler for Energy manager, DO  I have reviewed the above documentation for accuracy and completeness, and I agree with the above. [employer], DO

## 2020-07-20 ENCOUNTER — Encounter (INDEPENDENT_AMBULATORY_CARE_PROVIDER_SITE_OTHER): Payer: Self-pay | Admitting: Bariatrics

## 2020-07-27 ENCOUNTER — Encounter (INDEPENDENT_AMBULATORY_CARE_PROVIDER_SITE_OTHER): Payer: Self-pay

## 2020-07-28 ENCOUNTER — Encounter (INDEPENDENT_AMBULATORY_CARE_PROVIDER_SITE_OTHER): Payer: Medicare HMO | Admitting: Bariatrics

## 2020-08-09 ENCOUNTER — Ambulatory Visit (INDEPENDENT_AMBULATORY_CARE_PROVIDER_SITE_OTHER): Payer: Medicare HMO | Admitting: Bariatrics

## 2020-08-09 ENCOUNTER — Other Ambulatory Visit: Payer: Self-pay

## 2020-08-09 ENCOUNTER — Encounter (INDEPENDENT_AMBULATORY_CARE_PROVIDER_SITE_OTHER): Payer: Self-pay | Admitting: Bariatrics

## 2020-08-09 VITALS — BP 130/72 | HR 70 | Temp 97.8°F | Ht 67.0 in | Wt 208.0 lb

## 2020-08-09 DIAGNOSIS — I10 Essential (primary) hypertension: Secondary | ICD-10-CM | POA: Diagnosis not present

## 2020-08-09 DIAGNOSIS — E669 Obesity, unspecified: Secondary | ICD-10-CM

## 2020-08-09 DIAGNOSIS — Z6832 Body mass index (BMI) 32.0-32.9, adult: Secondary | ICD-10-CM | POA: Diagnosis not present

## 2020-08-09 DIAGNOSIS — E559 Vitamin D deficiency, unspecified: Secondary | ICD-10-CM

## 2020-08-09 DIAGNOSIS — E038 Other specified hypothyroidism: Secondary | ICD-10-CM | POA: Diagnosis not present

## 2020-08-09 MED ORDER — LOSARTAN POTASSIUM-HCTZ 100-25 MG PO TABS: 1.0000 | ORAL_TABLET | Freq: Every day | ORAL | 2 refills | Status: DC

## 2020-08-09 MED ORDER — LEVOTHYROXINE SODIUM 75 MCG PO TABS: 75.0000 ug | ORAL_TABLET | Freq: Every day | ORAL | 2 refills | Status: DC

## 2020-08-09 MED ORDER — VITAMIN D (ERGOCALCIFEROL) 1.25 MG (50000 UNIT) PO CAPS: 50000.0000 [IU] | ORAL_CAPSULE | ORAL | 0 refills | Status: DC

## 2020-08-09 NOTE — Progress Notes (Signed)
Chief Complaint:   OBESITY Meghan Diaz is here to discuss her progress with her obesity treatment plan along with follow-up of her obesity related diagnoses. Meghan Diaz is on the Category 2 Plan and states she is following her eating plan approximately 100% of the time. Meghan Diaz states she is walking for 30 minutes 7 times per week.  Today's visit was #: 34 Starting weight: 212 lbs Starting date: 03/20/2018 Today's weight: 208 lbs Today's date: 08/09/2020 Total lbs lost to date: 4 lbs Total lbs lost since last in-office visit: 12 lbs  Interim History: Meghan Diaz is down 12 pounds.  She is journaling and will continue.  She is sleeping well.  Subjective:   1. Essential hypertension Controlled.  Review: taking medications as instructed, no medication side effects noted, no chest pain on exertion, no dyspnea on exertion, no swelling of ankles.    BP Readings from Last 3 Encounters:  08/09/20 130/72  07/14/20 127/69  05/24/20 140/86   2. Other specified hypothyroidism Meghan Diaz is taking levothyroxine 75 mcg daily.  Lab Results  Component Value Date   TSH 2.160 12/10/2019   3. Vitamin D deficiency Meghan Diaz's Vitamin D level was 35.6 on 12/10/2019. She is currently taking prescription vitamin D 50,000 IU each week. She denies nausea, vomiting or muscle weakness.  Assessment/Plan:   1. Essential hypertension Juliza is working on healthy weight loss and exercise to improve blood pressure control. We will watch for signs of hypotension as she continues her lifestyle modifications.  - Refill losartan-hydrochlorothiazide (HYZAAR) 100-25 MG tablet; Take 1 tablet by mouth daily.  Dispense: 30 tablet; Refill: 2  2. Other specified hypothyroidism Patient with long-standing hypothyroidism, on levothyroxine therapy. She appears euthyroid. Orders and follow up as documented in patient record.  Counseling . Good thyroid control is important for overall health. Supratherapeutic thyroid levels are dangerous  and will not improve weight loss results. . The correct way to take levothyroxine is fasting, with water, separated by at least 30 minutes from breakfast, and separated by more than 4 hours from calcium, iron, multivitamins, acid reflux medications (PPIs).   - Refill levothyroxine (SYNTHROID) 75 MCG tablet; Take 1 tablet (75 mcg total) by mouth daily before breakfast.  Dispense: 30 tablet; Refill: 2  3. Vitamin D deficiency Low Vitamin D level contributes to fatigue and are associated with obesity, breast, and colon cancer. She agrees to continue to take prescription Vitamin D @50 ,000 IU every week and will follow-up for routine testing of Vitamin D, at least 2-3 times per year to avoid over-replacement.  - Refill Vitamin D, Ergocalciferol, (DRISDOL) 1.25 MG (50000 UNIT) CAPS capsule; Take 1 capsule (50,000 Units total) by mouth every 7 (seven) days.  Dispense: 4 capsule; Refill: 0  4. Class 1 obesity with serious comorbidity and body mass index (BMI) of 32.0 to 32.9 in adult, unspecified obesity type  Meghan Diaz is currently in the action stage of change. As such, her goal is to continue with weight loss efforts. She has agreed to keeping a food journal and adhering to recommended goals of 1200 calories and 90 grams of protein.   She will work on meal planning, mindful eating, and will continue to journal.  Exercise goals: She will start back to her walking.  Behavioral modification strategies: increasing lean protein intake, decreasing simple carbohydrates, increasing vegetables, increasing water intake, decreasing eating out, no skipping meals, meal planning and cooking strategies, keeping healthy foods in the home and planning for success.  Meghan Diaz has agreed to follow-up  with our clinic in 2-3 weeks. She was informed of the importance of frequent follow-up visits to maximize her success with intensive lifestyle modifications for her multiple health conditions.   Objective:   Blood pressure  130/72, pulse 70, temperature 97.8 F (36.6 C), height 5\' 7"  (1.702 m), weight 208 lb (94.3 kg), SpO2 98 %. Body mass index is 32.58 kg/m.  General: Cooperative, alert, well developed, in no acute distress. HEENT: Conjunctivae and lids unremarkable. Cardiovascular: Regular rhythm.  Lungs: Normal work of breathing. Neurologic: No focal deficits.   Lab Results  Component Value Date   CREATININE 0.83 12/10/2019   BUN 27 12/10/2019   NA 138 12/10/2019   K 4.5 12/10/2019   CL 100 12/10/2019   CO2 25 12/10/2019   Lab Results  Component Value Date   ALT 13 12/10/2019   AST 22 12/10/2019   ALKPHOS 87 12/10/2019   BILITOT 0.3 12/10/2019   Lab Results  Component Value Date   HGBA1C 5.5 02/05/2019   HGBA1C 5.5 08/12/2018   HGBA1C 5.6 03/20/2018   Lab Results  Component Value Date   INSULIN 6.8 02/05/2019   INSULIN 17.5 08/12/2018   INSULIN 10.4 03/20/2018   Lab Results  Component Value Date   TSH 2.160 12/10/2019   Lab Results  Component Value Date   CHOL 246 (H) 12/10/2019   HDL 99 12/10/2019   LDLCALC 136 (H) 12/10/2019   TRIG 69 12/10/2019   Obesity Behavioral Intervention:   Approximately 15 minutes were spent on the discussion below.  ASK: We discussed the diagnosis of obesity with Meghan Diaz today and Meghan Diaz agreed to give 02/09/2020 permission to discuss obesity behavioral modification therapy today.  ASSESS: Meghan Diaz has the diagnosis of obesity and her BMI today is 32.7. Meghan Diaz is in the action stage of change.   ADVISE: Meghan Diaz was educated on the multiple health risks of obesity as well as the benefit of weight loss to improve her health. She was advised of the need for long term treatment and the importance of lifestyle modifications to improve her current health and to decrease her risk of future health problems.  AGREE: Multiple dietary modification options and treatment options were discussed and Meghan Diaz agreed to follow the recommendations documented in the above  note.  ARRANGE: Meghan Diaz was educated on the importance of frequent visits to treat obesity as outlined per CMS and USPSTF guidelines and agreed to schedule her next follow up appointment today.  Attestation Statements:   Reviewed by clinician on day of visit: allergies, medications, problem list, medical history, surgical history, family history, social history, and previous encounter notes.  I, Inez Catalina, CMA, am acting as Insurance claims handler for Energy manager, DO  I have reviewed the above documentation for accuracy and completeness, and I agree with the above. [employer], DO

## 2020-08-18 ENCOUNTER — Ambulatory Visit (INDEPENDENT_AMBULATORY_CARE_PROVIDER_SITE_OTHER): Payer: Medicare HMO | Admitting: Bariatrics

## 2020-08-23 ENCOUNTER — Ambulatory Visit (INDEPENDENT_AMBULATORY_CARE_PROVIDER_SITE_OTHER): Payer: Medicare HMO | Admitting: Bariatrics

## 2020-09-29 NOTE — Progress Notes (Signed)
This encounter was created in error - please disregard.  This encounter was created in error - please disregard.

## 2020-11-09 ENCOUNTER — Ambulatory Visit (INDEPENDENT_AMBULATORY_CARE_PROVIDER_SITE_OTHER): Payer: Medicare HMO | Admitting: Bariatrics

## 2020-11-09 ENCOUNTER — Other Ambulatory Visit: Payer: Self-pay

## 2020-11-09 ENCOUNTER — Encounter (INDEPENDENT_AMBULATORY_CARE_PROVIDER_SITE_OTHER): Payer: Self-pay | Admitting: Bariatrics

## 2020-11-09 VITALS — BP 146/81 | HR 80 | Temp 98.0°F | Ht 67.0 in | Wt 218.0 lb

## 2020-11-09 DIAGNOSIS — E038 Other specified hypothyroidism: Secondary | ICD-10-CM | POA: Diagnosis not present

## 2020-11-09 DIAGNOSIS — E669 Obesity, unspecified: Secondary | ICD-10-CM | POA: Diagnosis not present

## 2020-11-09 DIAGNOSIS — I10 Essential (primary) hypertension: Secondary | ICD-10-CM | POA: Diagnosis not present

## 2020-11-09 DIAGNOSIS — Z6833 Body mass index (BMI) 33.0-33.9, adult: Secondary | ICD-10-CM

## 2020-11-10 NOTE — Progress Notes (Signed)
Chief Complaint:   OBESITY Meghan Diaz is here to discuss her progress with her obesity treatment plan along with follow-up of her obesity related diagnoses. Meghan Diaz is on keeping a food journal and adhering to recommended goals of 1200 calories and 90 grams protein and states she is following her eating plan approximately 0% of the time. Meghan Diaz states she is not currently exercising.  Today's visit was #: 35 Starting weight: 212 lbs Starting date: 03/20/2018 Today's weight: 218 lbs Today's date: 11/09/2020 Total lbs lost to date: 0 Total lbs lost since last in-office visit: 0  Interim History: Meghan Diaz is up 10 lbs since her last visit. She wants to start over and be more positive. She is sleeping more.   Subjective:   1. Essential hypertension Meghan Diaz is taking Hyzaar.  BP Readings from Last 3 Encounters:  11/09/20 (!) 146/81  08/09/20 130/72  07/14/20 127/69   2. Other specified hypothyroidism Meghan Diaz is taking Synthroid 75 mcg.  Lab Results  Component Value Date   TSH 2.160 12/10/2019    Assessment/Plan:   1. Essential hypertension Meghan Diaz is working on healthy weight loss and exercise to improve blood pressure control. We will watch for signs of hypotension as she continues her lifestyle modifications. Continue current treatment plan.  2. Other specified hypothyroidism Patient with long-standing hypothyroidism, on levothyroxine therapy. She appears euthyroid. Orders and follow up as documented in patient record. Continue current treatment plan.  Counseling . Good thyroid control is important for overall health. Supratherapeutic thyroid levels are dangerous and will not improve weight loss results. . The correct way to take levothyroxine is fasting, with water, separated by at least 30 minutes from breakfast, and separated by more than 4 hours from calcium, iron, multivitamins, acid reflux medications (PPIs).   3. Obesity, current BMI 34  Meghan Diaz is currently in the action  stage of change. As such, her goal is to continue with weight loss efforts. She has agreed to the Category 2 Plan.  Meal plan Discussed techniques to decrease emotional eating.  Exercise goals: Walking, has an appointment with orthopedist next week.  Behavioral modification strategies: increasing lean protein intake, decreasing simple carbohydrates, increasing vegetables, increasing water intake, no skipping meals, meal planning and cooking strategies, keeping healthy foods in the home, ways to avoid boredom eating and planning for success.  Meghan Diaz has agreed to follow-up with our clinic in 2-3 weeks. She was informed of the importance of frequent follow-up visits to maximize her success with intensive lifestyle modifications for her multiple health conditions.   Objective:   Blood pressure (!) 146/81, pulse 80, temperature 98 F (36.7 C), height 5\' 7"  (1.702 m), weight 218 lb (98.9 kg), SpO2 96 %. Body mass index is 34.14 kg/m.  General: Cooperative, alert, well developed, in no acute distress. HEENT: Conjunctivae and lids unremarkable. Cardiovascular: Regular rhythm.  Lungs: Normal work of breathing. Neurologic: No focal deficits.   Lab Results  Component Value Date   CREATININE 0.83 12/10/2019   BUN 27 12/10/2019   NA 138 12/10/2019   K 4.5 12/10/2019   CL 100 12/10/2019   CO2 25 12/10/2019   Lab Results  Component Value Date   ALT 13 12/10/2019   AST 22 12/10/2019   ALKPHOS 87 12/10/2019   BILITOT 0.3 12/10/2019   Lab Results  Component Value Date   HGBA1C 5.5 02/05/2019   HGBA1C 5.5 08/12/2018   HGBA1C 5.6 03/20/2018   Lab Results  Component Value Date   INSULIN 6.8 02/05/2019  INSULIN 17.5 08/12/2018   INSULIN 10.4 03/20/2018   Lab Results  Component Value Date   TSH 2.160 12/10/2019   Lab Results  Component Value Date   CHOL 246 (H) 12/10/2019   HDL 99 12/10/2019   LDLCALC 136 (H) 12/10/2019   TRIG 69 12/10/2019   No results found for: WBC, HGB,  HCT, MCV, PLT No results found for: IRON, TIBC, FERRITIN  Obesity Behavioral Intervention:   Approximately 15 minutes were spent on the discussion below.  ASK: We discussed the diagnosis of obesity with Meghan Diaz today and Meghan Diaz agreed to give [place] permission to discuss obesity behavioral modification therapy today.  ASSESS: Meghan Diaz has the diagnosis of obesity and her BMI today is 34.3. Meghan Diaz is in the action stage of change.   ADVISE: Meghan Diaz was educated on the multiple health risks of obesity as well as the benefit of weight loss to improve her health. She was advised of the need for long term treatment and the importance of lifestyle modifications to improve her current health and to decrease her risk of future health problems.  AGREE: Multiple dietary modification options and treatment options were discussed and Meghan Diaz agreed to follow the recommendations documented in the above note.  ARRANGE: Meghan Diaz was educated on the importance of frequent visits to treat obesity as outlined per CMS and USPSTF guidelines and agreed to schedule her next follow up appointment today.  Attestation Statements:   Reviewed by clinician on day of visit: allergies, medications, problem list, medical history, surgical history, family history, social history, and previous encounter notes.  Meghan Diaz, CMA, am acting as Energy manager for [employer], DO.  I have reviewed the above documentation for accuracy and completeness, and I agree with the above. Corinna Capra, DO

## 2020-11-14 ENCOUNTER — Encounter (INDEPENDENT_AMBULATORY_CARE_PROVIDER_SITE_OTHER): Payer: Self-pay | Admitting: Bariatrics

## 2020-11-28 ENCOUNTER — Encounter (INDEPENDENT_AMBULATORY_CARE_PROVIDER_SITE_OTHER): Payer: Self-pay | Admitting: Bariatrics

## 2020-11-28 ENCOUNTER — Other Ambulatory Visit: Payer: Self-pay

## 2020-11-28 ENCOUNTER — Ambulatory Visit (INDEPENDENT_AMBULATORY_CARE_PROVIDER_SITE_OTHER): Payer: Medicare HMO | Admitting: Bariatrics

## 2020-11-28 VITALS — BP 146/82 | HR 66 | Temp 97.8°F | Ht 67.0 in | Wt 222.0 lb

## 2020-11-28 DIAGNOSIS — E538 Deficiency of other specified B group vitamins: Secondary | ICD-10-CM

## 2020-11-28 DIAGNOSIS — E038 Other specified hypothyroidism: Secondary | ICD-10-CM | POA: Diagnosis not present

## 2020-11-28 DIAGNOSIS — E559 Vitamin D deficiency, unspecified: Secondary | ICD-10-CM | POA: Diagnosis not present

## 2020-11-28 DIAGNOSIS — F3289 Other specified depressive episodes: Secondary | ICD-10-CM | POA: Diagnosis not present

## 2020-11-28 DIAGNOSIS — Z6833 Body mass index (BMI) 33.0-33.9, adult: Secondary | ICD-10-CM

## 2020-11-28 DIAGNOSIS — E669 Obesity, unspecified: Secondary | ICD-10-CM | POA: Diagnosis not present

## 2020-11-29 ENCOUNTER — Encounter (INDEPENDENT_AMBULATORY_CARE_PROVIDER_SITE_OTHER): Payer: Self-pay | Admitting: Bariatrics

## 2020-11-29 LAB — TSH+T4F+T3FREE
Free T4: 1.03 ng/dL (ref 0.82–1.77)
T3, Free: 2.1 pg/mL (ref 2.0–4.4)
TSH: 3.12 u[IU]/mL (ref 0.450–4.500)

## 2020-11-29 LAB — COMPREHENSIVE METABOLIC PANEL
ALT: 13 IU/L (ref 0–32)
AST: 22 IU/L (ref 0–40)
Albumin/Globulin Ratio: 1.9 (ref 1.2–2.2)
Albumin: 4.4 g/dL (ref 3.7–4.7)
Alkaline Phosphatase: 86 IU/L (ref 44–121)
BUN/Creatinine Ratio: 16 (ref 12–28)
BUN: 13 mg/dL (ref 8–27)
Bilirubin Total: 0.4 mg/dL (ref 0.0–1.2)
CO2: 23 mmol/L (ref 20–29)
Calcium: 9.4 mg/dL (ref 8.7–10.3)
Chloride: 100 mmol/L (ref 96–106)
Creatinine, Ser: 0.8 mg/dL (ref 0.57–1.00)
Globulin, Total: 2.3 g/dL (ref 1.5–4.5)
Glucose: 84 mg/dL (ref 65–99)
Potassium: 4.7 mmol/L (ref 3.5–5.2)
Sodium: 138 mmol/L (ref 134–144)
Total Protein: 6.7 g/dL (ref 6.0–8.5)
eGFR: 76 mL/min/{1.73_m2} (ref >59)

## 2020-11-29 LAB — VITAMIN D 25 HYDROXY (VIT D DEFICIENCY, FRACTURES): Vit D, 25-Hydroxy: 44.3 ng/mL (ref 30.0–100.0)

## 2020-11-29 LAB — VITAMIN B12: Vitamin B-12: 301 pg/mL (ref 232–1245)

## 2020-11-29 NOTE — Progress Notes (Signed)
Left msg for pt advising labs normal

## 2020-11-29 NOTE — Progress Notes (Signed)
Chief Complaint:   OBESITY Meghan Diaz is here to discuss her progress with her obesity treatment plan along with follow-up of her obesity related diagnoses. Meghan Diaz is on the Category 2 Plan and states she is following her eating plan approximately 0% of the time. Meghan Diaz states she is not currently exercising.  Today's visit was #: 36 Starting weight: 212 lbs Starting date: 03/20/2018 Today's weight: 222 lbs Today's date: 11/28/2020 Total lbs lost to date: 0 Total lbs lost since last in-office visit: 0  Interim History: Meghan Diaz is having more stress, depression, aches, and pain.  Subjective:   1. Vitamin B 12 deficiency Meghan Diaz is not on a B12 supplement.  2. Vitamin D deficiency Meghan Diaz is taking prescription Vit D weekly.  3. Other specified hypothyroidism Meghan Diaz is taking Synthroid 75 mcg daily.  4. Other depression, with emotional eating Meghan Diaz is taking Xanax at night, but no other medications.  Assessment/Plan:   1. Vitamin B 12 deficiency The diagnosis was reviewed with the patient. Counseling provided today, see below. We will continue to monitor. Orders and follow up as documented in patient record.  Counseling . The body needs vitamin B12: to make red blood cells; to make DNA; and to help the nerves work properly so they can carry messages from the brain to the body.  . The main causes of vitamin B12 deficiency include dietary deficiency, digestive diseases, pernicious anemia, and having a surgery in which part of the stomach or small intestine is removed.  . Certain medicines can make it harder for the body to absorb vitamin B12. These medicines include: heartburn medications; some antibiotics; some medications used to treat diabetes, gout, and high cholesterol.  . In some cases, there are no symptoms of this condition. If the condition leads to anemia or nerve damage, various symptoms can occur, such as weakness or fatigue, shortness of breath, and numbness or tingling  in your hands and feet.   . Treatment:  o May include taking vitamin B12 supplements.  o Avoid alcohol.  o Eat lots of healthy foods that contain vitamin B12: - Beef, pork, chicken, [place], and organ meats, such as liver.  - Seafood: This includes clams, rainbow trout, salmon, tuna, and haddock. Eggs.  - Cereal and dairy products that are fortified: This means that vitamin B12 has been added to the food.   - Vitamin B12 - Comprehensive metabolic panel  2. Vitamin D deficiency Low Vitamin D level contributes to fatigue and are associated with obesity, breast, and colon cancer. She agrees to continue to take prescription Vitamin D @50 ,000 IU every week and will follow-up for routine testing of Vitamin D, at least 2-3 times per year to avoid over-replacement.  - VITAMIN D 25 Hydroxy (Vit-D Deficiency, Fractures)  3. Other specified hypothyroidism Patient with long-standing hypothyroidism, on levothyroxine therapy. She appears euthyroid. Orders and follow up as documented in patient record.  Counseling . Good thyroid control is important for overall health. Supratherapeutic thyroid levels are dangerous and will not improve weight loss results. . The correct way to take levothyroxine is fasting, with water, separated by at least 30 minutes from breakfast, and separated by more than 4 hours from calcium, iron, multivitamins, acid reflux medications (PPIs).   - Comprehensive metabolic panel  4. Other depression, with emotional eating Behavior modification techniques were discussed today to help Meghan Diaz deal with her emotional/non-hunger eating behaviors.  Orders and follow up as documented in patient record.  -Refer to Dr. Inez Catalina.  5. Obesity, current BMI 34 Meghan Diaz is currently in the action stage of change. As such, her goal is to continue with weight loss efforts. She has agreed to the Category 2 Plan.   Meal plan Decrease stress eating Will be more active  Exercise  goals: Will continue to walk as tolerated-Left foot and ankle pain.  Behavioral modification strategies: increasing lean protein intake, decreasing simple carbohydrates, increasing vegetables, increasing water intake, decreasing eating out, no skipping meals, meal planning and cooking strategies, keeping healthy foods in the home, avoiding temptations and planning for success.  Claryssa has agreed to follow-up with our clinic in 2 weeks. She was informed of the importance of frequent follow-up visits to maximize her success with intensive lifestyle modifications for her multiple health conditions.   Meghan Diaz was informed we would discuss her lab results at her next visit unless there is a critical issue that needs to be addressed sooner. Meghan Diaz agreed to keep her next visit at the agreed upon time to discuss these results.  Objective:   Blood pressure (!) 146/82, pulse 66, temperature 97.8 F (36.6 C), height 5\' 7"  (1.702 m), weight 222 lb (100.7 kg), SpO2 98 %. Body mass index is 34.77 kg/m.  General: Cooperative, alert, well developed, in no acute distress. HEENT: Conjunctivae and lids unremarkable. Cardiovascular: Regular rhythm.  Lungs: Normal work of breathing. Neurologic: No focal deficits.   Lab Results  Component Value Date   CREATININE 0.80 11/28/2020   BUN 13 11/28/2020   NA 138 11/28/2020   K 4.7 11/28/2020   CL 100 11/28/2020   CO2 23 11/28/2020   Lab Results  Component Value Date   ALT 13 11/28/2020   AST 22 11/28/2020   ALKPHOS 86 11/28/2020   BILITOT 0.4 11/28/2020   Lab Results  Component Value Date   HGBA1C 5.5 02/05/2019   HGBA1C 5.5 08/12/2018   HGBA1C 5.6 03/20/2018   Lab Results  Component Value Date   INSULIN 6.8 02/05/2019   INSULIN 17.5 08/12/2018   INSULIN 10.4 03/20/2018   Lab Results  Component Value Date   TSH 3.120 11/28/2020   Lab Results  Component Value Date   CHOL 246 (H) 12/10/2019   HDL 99 12/10/2019   LDLCALC 136 (H) 12/10/2019    TRIG 69 12/10/2019   No results found for: WBC, HGB, HCT, MCV, PLT No results found for: IRON, TIBC, FERRITIN  Obesity Behavioral Intervention:   Approximately 15 minutes were spent on the discussion below.  ASK: We discussed the diagnosis of obesity with Meghan Diaz today and Meghan Diaz agreed to give 02/09/2020 permission to discuss obesity behavioral modification therapy today.  ASSESS: Meghan Diaz has the diagnosis of obesity and her BMI today is 34.8. Meghan Diaz is in the action stage of change.   ADVISE: Meghan Diaz was educated on the multiple health risks of obesity as well as the benefit of weight loss to improve her health. She was advised of the need for long term treatment and the importance of lifestyle modifications to improve her current health and to decrease her risk of future health problems.  AGREE: Multiple dietary modification options and treatment options were discussed and Meghan Diaz agreed to follow the recommendations documented in the above note.  ARRANGE: Meghan Diaz was educated on the importance of frequent visits to treat obesity as outlined per CMS and USPSTF guidelines and agreed to schedule her next follow up appointment today.  Attestation Statements:   Reviewed by clinician on day of visit: allergies, medications, problem list, medical history,  surgical history, family history, social history, and previous encounter notes.  Edmund Hilda, CMA, am acting as Energy manager for [employer], DO.  I have reviewed the above documentation for accuracy and completeness, and I agree with the above. Corinna Capra, DO

## 2020-12-19 ENCOUNTER — Encounter (INDEPENDENT_AMBULATORY_CARE_PROVIDER_SITE_OTHER): Payer: Self-pay | Admitting: Bariatrics

## 2020-12-19 ENCOUNTER — Ambulatory Visit (INDEPENDENT_AMBULATORY_CARE_PROVIDER_SITE_OTHER): Payer: Medicare HMO | Admitting: Bariatrics

## 2020-12-19 ENCOUNTER — Other Ambulatory Visit: Payer: Self-pay

## 2020-12-19 VITALS — BP 149/85 | HR 70 | Temp 97.5°F | Ht 67.0 in | Wt 216.0 lb

## 2020-12-19 DIAGNOSIS — E669 Obesity, unspecified: Secondary | ICD-10-CM

## 2020-12-19 DIAGNOSIS — Z6833 Body mass index (BMI) 33.0-33.9, adult: Secondary | ICD-10-CM | POA: Diagnosis not present

## 2020-12-19 DIAGNOSIS — E559 Vitamin D deficiency, unspecified: Secondary | ICD-10-CM

## 2020-12-19 DIAGNOSIS — I10 Essential (primary) hypertension: Secondary | ICD-10-CM | POA: Diagnosis not present

## 2020-12-19 MED ORDER — VITAMIN D (ERGOCALCIFEROL) 1.25 MG (50000 UNIT) PO CAPS: 50000.0000 [IU] | ORAL_CAPSULE | ORAL | 0 refills | Status: DC

## 2020-12-20 ENCOUNTER — Telehealth (INDEPENDENT_AMBULATORY_CARE_PROVIDER_SITE_OTHER): Payer: Medicare HMO | Admitting: Psychology

## 2020-12-20 ENCOUNTER — Encounter (INDEPENDENT_AMBULATORY_CARE_PROVIDER_SITE_OTHER): Payer: Self-pay

## 2020-12-22 NOTE — Progress Notes (Signed)
Chief Complaint:   OBESITY Meghan Diaz is here to discuss her progress with her obesity treatment plan along with follow-up of her obesity related diagnoses. Meghan Diaz is on the Category 2 Plan and states she is following her eating plan approximately 80% of the time. Sherrie states she is walking 45 minutes 6 times per week.  Today's visit was #: 37 Starting weight: 212 lbs Starting date: 03/20/2018 Today's weight: 216 lbs Today's date: 12/19/2020 Total lbs lost to date: 0 Total lbs lost since last in-office visit: 6  Interim History: Meghan Diaz is down 6 lbs from her last visit and states that she is feeling better. She started to walk again. She is eating less sugar.  Subjective:   1. Vitamin D deficiency Meghan Diaz denies nausea, vomiting, and muscle weakness. Pt is on prescription Vit D.  2. Essential hypertension Meghan Diaz is taking Hyzaar.  BP Readings from Last 3 Encounters:  12/19/20 (!) 149/85  11/28/20 (!) 146/82  11/09/20 (!) 146/81   Assessment/Plan:   1. Vitamin D deficiency Low Vitamin D level contributes to fatigue and are associated with obesity, breast, and colon cancer. She agrees to continue to take prescription Vitamin D @50 ,000 IU every week and will follow-up for routine testing of Vitamin D, at least 2-3 times per year to avoid over-replacement.  - Vitamin D, Ergocalciferol, (DRISDOL) 1.25 MG (50000 UNIT) CAPS capsule; Take 1 capsule (50,000 Units total) by mouth every 7 (seven) days.  Dispense: 4 capsule; Refill: 0  2. Essential hypertension Meghan Diaz is working on healthy weight loss and exercise to improve blood pressure control. We will watch for signs of hypotension as she continues her lifestyle modifications. Continue current treatment plan.  3. Obesity, current BMI 34  Meghan Diaz is currently in the action stage of change. As such, her goal is to continue with weight loss efforts. She has agreed to the Category 2 Plan.   Meal plan Intentional eating Will stick  closely to the plan 11/28/2020 labs reviewed with pt  Exercise goals:  Increase walking  Behavioral modification strategies: increasing lean protein intake, decreasing simple carbohydrates, increasing vegetables, increasing water intake, decreasing eating out, no skipping meals, meal planning and cooking strategies, keeping healthy foods in the home, and planning for success.  Meghan Diaz has agreed to follow-up with our clinic in 2 weeks. She was informed of the importance of frequent follow-up visits to maximize her success with intensive lifestyle modifications for her multiple health conditions.   Objective:   Blood pressure (!) 149/85, pulse 70, temperature (!) 97.5 F (36.4 C), height 5\' 7"  (1.702 m), weight 216 lb (98 kg), SpO2 98 %. Body mass index is 33.83 kg/m.  General: Cooperative, alert, well developed, in no acute distress. HEENT: Conjunctivae and lids unremarkable. Cardiovascular: Regular rhythm.  Lungs: Normal work of breathing. Neurologic: No focal deficits.   Lab Results  Component Value Date   CREATININE 0.80 11/28/2020   BUN 13 11/28/2020   NA 138 11/28/2020   K 4.7 11/28/2020   CL 100 11/28/2020   CO2 23 11/28/2020   Lab Results  Component Value Date   ALT 13 11/28/2020   AST 22 11/28/2020   ALKPHOS 86 11/28/2020   BILITOT 0.4 11/28/2020   Lab Results  Component Value Date   HGBA1C 5.5 02/05/2019   HGBA1C 5.5 08/12/2018   HGBA1C 5.6 03/20/2018   Lab Results  Component Value Date   INSULIN 6.8 02/05/2019   INSULIN 17.5 08/12/2018   INSULIN 10.4 03/20/2018  Lab Results  Component Value Date   TSH 3.120 11/28/2020   Lab Results  Component Value Date   CHOL 246 (H) 12/10/2019   HDL 99 12/10/2019   LDLCALC 136 (H) 12/10/2019   TRIG 69 12/10/2019   No results found for: WBC, HGB, HCT, MCV, PLT No results found for: IRON, TIBC, FERRITIN  Obesity Behavioral Intervention:   Approximately 15 minutes were spent on the discussion  below.  ASK: We discussed the diagnosis of obesity with Velva today and Yoselin agreed to give [place] permission to discuss obesity behavioral modification therapy today.  ASSESS: Jochebed has the diagnosis of obesity and her BMI today is 34.0. Ramisa is in the action stage of change.   ADVISE: Wilhelmina was educated on the multiple health risks of obesity as well as the benefit of weight loss to improve her health. She was advised of the need for long term treatment and the importance of lifestyle modifications to improve her current health and to decrease her risk of future health problems.  AGREE: Multiple dietary modification options and treatment options were discussed and Dashayla agreed to follow the recommendations documented in the above note.  ARRANGE: Ester was educated on the importance of frequent visits to treat obesity as outlined per CMS and USPSTF guidelines and agreed to schedule her next follow up appointment today.  Attestation Statements:   Reviewed by clinician on day of visit: allergies, medications, problem list, medical history, surgical history, family history, social history, and previous encounter notes.  Edmund Hilda, CMA, am acting as Energy manager for [employer], DO.  I have reviewed the above documentation for accuracy and completeness, and I agree with the above. Corinna Capra, DO

## 2020-12-29 ENCOUNTER — Encounter (INDEPENDENT_AMBULATORY_CARE_PROVIDER_SITE_OTHER): Payer: Self-pay | Admitting: Bariatrics

## 2021-01-04 ENCOUNTER — Ambulatory Visit (INDEPENDENT_AMBULATORY_CARE_PROVIDER_SITE_OTHER): Payer: Medicare HMO | Admitting: Bariatrics

## 2021-01-05 ENCOUNTER — Encounter (INDEPENDENT_AMBULATORY_CARE_PROVIDER_SITE_OTHER): Payer: Self-pay | Admitting: Bariatrics

## 2021-01-05 ENCOUNTER — Other Ambulatory Visit: Payer: Self-pay

## 2021-01-05 ENCOUNTER — Ambulatory Visit (INDEPENDENT_AMBULATORY_CARE_PROVIDER_SITE_OTHER): Payer: Medicare HMO | Admitting: Bariatrics

## 2021-01-05 VITALS — BP 147/86 | HR 64 | Temp 97.8°F | Ht 67.0 in | Wt 221.0 lb

## 2021-01-05 DIAGNOSIS — E559 Vitamin D deficiency, unspecified: Secondary | ICD-10-CM

## 2021-01-05 DIAGNOSIS — E669 Obesity, unspecified: Secondary | ICD-10-CM

## 2021-01-05 DIAGNOSIS — Z6833 Body mass index (BMI) 33.0-33.9, adult: Secondary | ICD-10-CM | POA: Diagnosis not present

## 2021-01-05 DIAGNOSIS — I10 Essential (primary) hypertension: Secondary | ICD-10-CM | POA: Diagnosis not present
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
## 2021-01-10 NOTE — Progress Notes (Signed)
Chief Complaint:   OBESITY Meghan Diaz is here to discuss her progress with her obesity treatment plan along with follow-up of her obesity related diagnoses. Meghan Diaz is on the Category 2 Plan and states she is following her eating plan approximately 20% of the time. Meghan Diaz states she is walking for 45 minutes 7 times per week.  Today's visit was #: 38 Starting weight: 212 Starting date: 03/20/2018 Today's weight: 221 Today's date: 01/05/2021 Total lbs lost to date: 0 Total lbs lost since last in-office visit: 0  Interim History: Meghan Diaz is up an additional 5 lbs.  Meghan Diaz stated she has been having some sugary cravings.  Subjective:   1. Essential hypertension Review: taking medications as instructed, no medication side effects noted, no chest pain on exertion, no dyspnea on exertion, no swelling of ankles. Slightly elevated today.   BP Readings from Last 3 Encounters:  01/05/21 (!) 147/86  12/19/20 (!) 149/85  11/28/20 (!) 146/82     2. Vitamin D deficiency She is currently taking prescription vitamin D 50,000 IU each week. She denies nausea, vomiting or muscle weakness.  Lab Results  Component Value Date   VD25OH 44.3 11/28/2020   VD25OH 35.6 12/10/2019   VD25OH 35.6 02/05/2019    Assessment/Plan:   1. Essential hypertension Meghan Diaz is working on healthy weight loss and exercise to improve blood pressure control. We will watch for signs of hypotension as she continues her lifestyle modifications.  Continue medications.   2. Vitamin D deficiency Low Vitamin D level contributes to fatigue and are associated with obesity, breast, and colon cancer. She agrees to continue to take prescription Vitamin D @50 ,000 IU every week and will follow-up for routine testing of Vitamin D, at least 2-3 times per year to avoid over-replacement.   3. Obesity, current BMI 34.7   Meghan Diaz is currently in the action stage of change. As such, her goal is to continue with weight loss efforts.  She has agreed to the Category 2 Plan.   Meal planning and will adhere more closely to the plan.  Exercise goals:    Meghan Diaz stated she will try to stay busy and walk.  Behavioral modification strategies: increasing lean protein intake, decreasing simple carbohydrates, increasing vegetables, increasing water intake, decreasing eating out, no skipping meals, meal planning and cooking strategies, keeping healthy foods in the home, and planning for success.  Meghan Diaz has agreed to follow-up with our clinic in 2 weeks. She was informed of the importance of frequent follow-up visits to maximize her success with intensive lifestyle modifications for her multiple health conditions.   Objective:   Blood pressure (!) 147/86, pulse 64, temperature 97.8 F (36.6 C), height 5\' 7"  (1.702 m), weight 221 lb (100.2 kg), SpO2 96 %. Body mass index is 34.61 kg/m.  General: Cooperative, alert, well developed, in no acute distress. HEENT: Conjunctivae and lids unremarkable. Cardiovascular: Regular rhythm.  Lungs: Normal work of breathing. Neurologic: No focal deficits.   Lab Results  Component Value Date   CREATININE 0.80 11/28/2020   BUN 13 11/28/2020   NA 138 11/28/2020   K 4.7 11/28/2020   CL 100 11/28/2020   CO2 23 11/28/2020   Lab Results  Component Value Date   ALT 13 11/28/2020   AST 22 11/28/2020   ALKPHOS 86 11/28/2020   BILITOT 0.4 11/28/2020   Lab Results  Component Value Date   HGBA1C 5.5 02/05/2019   HGBA1C 5.5 08/12/2018   HGBA1C 5.6 03/20/2018   Lab Results  Component Value Date   INSULIN 6.8 02/05/2019   INSULIN 17.5 08/12/2018   INSULIN 10.4 03/20/2018   Lab Results  Component Value Date   TSH 3.120 11/28/2020   Lab Results  Component Value Date   CHOL 246 (H) 12/10/2019   HDL 99 12/10/2019   LDLCALC 136 (H) 12/10/2019   TRIG 69 12/10/2019   Lab Results  Component Value Date   VD25OH 44.3 11/28/2020   VD25OH 35.6 12/10/2019   VD25OH 35.6 02/05/2019   No  results found for: WBC, HGB, HCT, MCV, PLT No results found for: IRON, TIBC, FERRITIN  Obesity Behavioral Intervention:   Approximately 15 minutes were spent on the discussion below.  ASK: We discussed the diagnosis of obesity with Meghan Diaz today and Meghan Diaz agreed to give [place] permission to discuss obesity behavioral modification therapy today.  ASSESS: Meghan Diaz has the diagnosis of obesity and her BMI today is 34.7. Meghan Diaz is in the action stage of change.   ADVISE: Meghan Diaz was educated on the multiple health risks of obesity as well as the benefit of weight loss to improve her health. She was advised of the need for long term treatment and the importance of lifestyle modifications to improve her current health and to decrease her risk of future health problems.  AGREE: Multiple dietary modification options and treatment options were discussed and Meghan Diaz agreed to follow the recommendations documented in the above note.  ARRANGE: Meghan Diaz was educated on the importance of frequent visits to treat obesity as outlined per CMS and USPSTF guidelines and agreed to schedule her next follow up appointment today.  Attestation Statements:   Reviewed by clinician on day of visit: allergies, medications, problem list, medical history, surgical history, family history, social history, and previous encounter notes.   Celso Sickle, am acting as Energy manager for [employer], DO .  I have reviewed the above documentation for accuracy and completeness, and I agree with the above. Corinna Capra, DO

## 2021-01-11 ENCOUNTER — Encounter (INDEPENDENT_AMBULATORY_CARE_PROVIDER_SITE_OTHER): Payer: Self-pay | Admitting: Bariatrics

## 2021-01-11 ENCOUNTER — Other Ambulatory Visit (INDEPENDENT_AMBULATORY_CARE_PROVIDER_SITE_OTHER): Payer: Self-pay | Admitting: Bariatrics

## 2021-01-11 DIAGNOSIS — E559 Vitamin D deficiency, unspecified: Secondary | ICD-10-CM

## 2021-01-24 ENCOUNTER — Telehealth (INDEPENDENT_AMBULATORY_CARE_PROVIDER_SITE_OTHER): Payer: Medicare HMO | Admitting: Psychology

## 2021-01-26 ENCOUNTER — Ambulatory Visit (INDEPENDENT_AMBULATORY_CARE_PROVIDER_SITE_OTHER): Payer: Medicare HMO | Admitting: Bariatrics

## 2021-01-26 ENCOUNTER — Other Ambulatory Visit: Payer: Self-pay

## 2021-01-26 ENCOUNTER — Encounter (INDEPENDENT_AMBULATORY_CARE_PROVIDER_SITE_OTHER): Payer: Self-pay | Admitting: Bariatrics

## 2021-01-26 VITALS — BP 146/89 | HR 60 | Temp 98.0°F | Ht 67.0 in | Wt 219.0 lb

## 2021-01-26 DIAGNOSIS — E559 Vitamin D deficiency, unspecified: Secondary | ICD-10-CM | POA: Diagnosis not present

## 2021-01-26 DIAGNOSIS — F5089 Other specified eating disorder: Secondary | ICD-10-CM

## 2021-01-26 DIAGNOSIS — Z6833 Body mass index (BMI) 33.0-33.9, adult: Secondary | ICD-10-CM

## 2021-01-26 DIAGNOSIS — E669 Obesity, unspecified: Secondary | ICD-10-CM

## 2021-01-26 DIAGNOSIS — I10 Essential (primary) hypertension: Secondary | ICD-10-CM | POA: Diagnosis not present

## 2021-01-26 MED ORDER — VITAMIN D (ERGOCALCIFEROL) 1.25 MG (50000 UNIT) PO CAPS: 50000.0000 [IU] | ORAL_CAPSULE | ORAL | 0 refills | Status: DC

## 2021-01-30 ENCOUNTER — Other Ambulatory Visit (INDEPENDENT_AMBULATORY_CARE_PROVIDER_SITE_OTHER): Payer: Self-pay

## 2021-01-30 DIAGNOSIS — E559 Vitamin D deficiency, unspecified: Secondary | ICD-10-CM

## 2021-01-31 ENCOUNTER — Encounter (INDEPENDENT_AMBULATORY_CARE_PROVIDER_SITE_OTHER): Payer: Self-pay | Admitting: Bariatrics

## 2021-01-31 NOTE — Progress Notes (Signed)
Chief Complaint:   OBESITY Meghan Diaz is here to discuss her progress with her obesity treatment plan along with follow-up of her obesity related diagnoses. Meghan Diaz is on the Category 2 Plan and states she is following her eating plan approximately 60% of the time. Meghan Diaz states she is not currently exercising.  Today's visit was #: 39 Starting weight: 212 lbs Starting date: 03/20/2018 Today's weight: 219 lbs Today's date: 01/26/2021 Total lbs lost to date: 0 Total lbs lost since last in-office visit: 2  Interim History: Meghan Diaz is down 2 lbs from her last visit.  Subjective:   1. Vitamin D deficiency Meghan Diaz is taking prescription Vit D.  2. Essential hypertension Her BP is slightly elevated today. She is taking Hyzaar.  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 She has scheduled an appointment.  Assessment/Plan:   1. Vitamin D deficiency Low Vitamin D level contributes to fatigue and are associated with obesity, breast, and colon cancer. She agrees to continue to take prescription Vitamin D @50 ,000 IU every week and will follow-up for routine testing of Vitamin D, at least 2-3 times per year to avoid over-replacement.  Refill- Vitamin D, Ergocalciferol, (DRISDOL) 1.25 MG (50000 UNIT) CAPS capsule; Take 1 capsule (50,000 Units total) by mouth every 7 (seven) days.  Dispense: 4 capsule; Refill: 0  2. Essential hypertension Meghan Diaz is working on healthy weight loss and exercise to improve blood pressure control. We will watch for signs of hypotension as she continues her lifestyle modifications. Continue current treatment plan.  3. Other disorder of eating Meghan Diaz will go to the shelter and get a cat. She is seeing a Meghan Diaz.  4. Obesity, current BMI 34.4  Meghan Diaz is currently in the action stage of change. As such, her goal is to continue with weight loss efforts. She has agreed to the Category 2 Plan.   Pt will adhere closely to the  plan.  Exercise goals: No exercise has been prescribed at this time. Pt has pain in her feet and legs.  Behavioral modification strategies: increasing lean protein intake, decreasing simple carbohydrates, increasing vegetables, increasing water intake, decreasing eating out, no skipping meals, meal planning and cooking strategies, keeping healthy foods in the home, and planning for success.  Meghan Diaz has agreed to follow-up with our clinic in 2 weeks. She was informed of the importance of frequent follow-up visits to maximize her success with intensive lifestyle modifications for her multiple health conditions.   Objective:   Blood pressure (!) 146/89, pulse 60, temperature 98 F (36.7 C), height 5\' 7"  (1.702 m), weight 219 lb (99.3 kg), SpO2 98 %. Body mass index is 34.3 kg/m.  General: Cooperative, alert, well developed, in no acute distress. HEENT: Conjunctivae and lids unremarkable. Cardiovascular: Regular rhythm.  Lungs: Normal work of breathing. Neurologic: No focal deficits.   Lab Results  Component Value Date   CREATININE 0.80 11/28/2020   BUN 13 11/28/2020   NA 138 11/28/2020   K 4.7 11/28/2020   CL 100 11/28/2020   CO2 23 11/28/2020   Lab Results  Component Value Date   ALT 13 11/28/2020   AST 22 11/28/2020   ALKPHOS 86 11/28/2020   BILITOT 0.4 11/28/2020   Lab Results  Component Value Date   HGBA1C 5.5 02/05/2019   HGBA1C 5.5 08/12/2018   HGBA1C 5.6 03/20/2018   Lab Results  Component Value Date   INSULIN 6.8 02/05/2019   INSULIN 17.5 08/12/2018   INSULIN 10.4 03/20/2018   Lab  Results  Component Value Date   TSH 3.120 11/28/2020   Lab Results  Component Value Date   CHOL 246 (H) 12/10/2019   HDL 99 12/10/2019   LDLCALC 136 (H) 12/10/2019   TRIG 69 12/10/2019   Lab Results  Component Value Date   VD25OH 44.3 11/28/2020   VD25OH 35.6 12/10/2019   VD25OH 35.6 02/05/2019   No results found for: WBC, HGB, HCT, MCV, PLT No results found for: IRON,  TIBC, FERRITIN  Obesity Behavioral Intervention:   Approximately 15 minutes were spent on the discussion below.  ASK: We discussed the diagnosis of obesity with Meghan Diaz today and Meghan Diaz agreed to give [place] permission to discuss obesity behavioral modification therapy today.  ASSESS: Meghan Diaz has the diagnosis of obesity and her BMI today is 34.4. Meghan Diaz is in the action stage of change.   ADVISE: Meghan Diaz was educated on the multiple health risks of obesity as well as the benefit of weight loss to improve her health. She was advised of the need for long term treatment and the importance of lifestyle modifications to improve her current health and to decrease her risk of future health problems.  AGREE: Multiple dietary modification options and treatment options were discussed and Meghan Diaz agreed to follow the recommendations documented in the above note.  ARRANGE: Meghan Diaz was educated on the importance of frequent visits to treat obesity as outlined per CMS and USPSTF guidelines and agreed to schedule her next follow up appointment today.  Attestation Statements:   Reviewed by clinician on day of visit: allergies, medications, problem list, medical history, surgical history, family history, social history, and previous encounter notes.  Edmund Hilda, CMA, am acting as Energy manager for [employer], DO.  I have reviewed the above documentation for accuracy and completeness, and I agree with the above. Corinna Capra, DO

## 2021-02-13 ENCOUNTER — Ambulatory Visit (INDEPENDENT_AMBULATORY_CARE_PROVIDER_SITE_OTHER): Payer: Medicare HMO | Admitting: Bariatrics

## 2021-02-13 ENCOUNTER — Other Ambulatory Visit: Payer: Self-pay

## 2021-02-13 ENCOUNTER — Encounter (INDEPENDENT_AMBULATORY_CARE_PROVIDER_SITE_OTHER): Payer: Self-pay | Admitting: Bariatrics

## 2021-02-13 VITALS — BP 145/82 | HR 66 | Temp 98.3°F | Ht 67.0 in | Wt 215.0 lb

## 2021-02-13 DIAGNOSIS — F5089 Other specified eating disorder: Secondary | ICD-10-CM

## 2021-02-13 DIAGNOSIS — I10 Essential (primary) hypertension: Secondary | ICD-10-CM | POA: Diagnosis not present

## 2021-02-13 DIAGNOSIS — E669 Obesity, unspecified: Secondary | ICD-10-CM

## 2021-02-13 DIAGNOSIS — Z6833 Body mass index (BMI) 33.0-33.9, adult: Secondary | ICD-10-CM

## 2021-02-13 NOTE — Patient Instructions (Signed)
Health Maintenance Due  Topic Date Due   Hepatitis C Screening  Never done   TETANUS/TDAP  Never done   DEXA SCAN  Never done   Zoster Vaccines- Shingrix (2 of 2) 09/11/2019   COVID-19 Vaccine (4 - Booster for Moderna series) 09/07/2020   INFLUENZA VACCINE  02/06/2021    Depression screen [facility] 2/9 08/12/2018 07/22/2018 06/10/2018  Decreased Interest 0 1 0  Down, Depressed, Hopeless 0 1 0  PHQ - 2 Score 0 2 0  Altered sleeping 1 1 0  Tired, decreased energy 0 1 0  Change in appetite 0 0 3  Feeling bad or failure about yourself  0 1 0  Trouble concentrating 0 1 0  Moving slowly or fidgety/restless 0 0 0  Suicidal thoughts 0 0 0  PHQ-9 Score 1 6 3   Difficult doing work/chores - - -

## 2021-02-14 NOTE — Progress Notes (Signed)
Chief Complaint:   OBESITY Meghan Diaz is here to discuss her progress with her obesity treatment plan along with follow-up of her obesity related diagnoses. Meghan Diaz is on the Category 2 Plan and states she is following her eating plan approximately 90% of the time. Meghan Diaz states she is walking for 0 minutes 2 times per week.  Today's visit was #: 40 Starting weight: 212 lbs Starting date: 03/20/2018 Today's weight: 215 lbs Today's date: 02/13/2021 Total lbs lost to date: 0 Total lbs lost since last in-office visit: 4 lbs  Interim History: Meghan Diaz is down an additional 4 lbs. She is in pain (neck strain) and blood pressure.  Subjective:   1. Essential hypertension Meghan Diaz is taking Hyzaar.  2. Other disorder of eating Meghan Diaz has tried medications in the past (side effects).  Assessment/Plan:   1. Essential hypertension Meghan Diaz will continue medications. She is working on healthy weight loss and exercise to improve blood pressure control. We will watch for signs of hypotension as she continues her lifestyle modifications.   2. Other disorder of eating I discussed with Meghan Diaz strategies for eating disorders. Behavior modification techniques were discussed today to help Meghan Diaz deal with her emotional/non-hunger eating behaviors.  Orders and follow up as documented in patient record.    3. Obesity, current BMI 33.7 Meghan Diaz is currently in the action stage of change. As such, her goal is to continue with weight loss efforts. She has agreed to the Category 2 Plan and keeping a food journal and adhering to recommended goals of 1200 calories and 80-90 grams of protein.   Meghan Diaz will continue meal planning. She will continue intentional eating. Distinguish techniques and reviewed decreased snacks.  Exercise goals: No exercise has been prescribed at this time.  Behavioral modification strategies: increasing lean protein intake, decreasing simple carbohydrates, increasing vegetables,  increasing water intake, decreasing eating out, no skipping meals, meal planning and cooking strategies, keeping healthy foods in the home, and planning for success.  Meghan Diaz has agreed to follow-up with our clinic in 2 weeks. She was informed of the importance of frequent follow-up visits to maximize her success with intensive lifestyle modifications for her multiple health conditions.   Objective:   Blood pressure (!) 145/82, pulse 66, temperature 98.3 F (36.8 C), height 5\' 7"  (1.702 m), weight 215 lb (97.5 kg), SpO2 99 %. Body mass index is 33.67 kg/m.  General: Cooperative, alert, well developed, in no acute distress. HEENT: Conjunctivae and lids unremarkable. Cardiovascular: Regular rhythm.  Lungs: Normal work of breathing. Neurologic: No focal deficits.   Lab Results  Component Value Date   CREATININE 0.80 11/28/2020   BUN 13 11/28/2020   NA 138 11/28/2020   K 4.7 11/28/2020   CL 100 11/28/2020   CO2 23 11/28/2020   Lab Results  Component Value Date   ALT 13 11/28/2020   AST 22 11/28/2020   ALKPHOS 86 11/28/2020   BILITOT 0.4 11/28/2020   Lab Results  Component Value Date   HGBA1C 5.5 02/05/2019   HGBA1C 5.5 08/12/2018   HGBA1C 5.6 03/20/2018   Lab Results  Component Value Date   INSULIN 6.8 02/05/2019   INSULIN 17.5 08/12/2018   INSULIN 10.4 03/20/2018   Lab Results  Component Value Date   TSH 3.120 11/28/2020   Lab Results  Component Value Date   CHOL 246 (H) 12/10/2019   HDL 99 12/10/2019   LDLCALC 136 (H) 12/10/2019   TRIG 69 12/10/2019   Lab Results  Component Value Date  VD25OH 44.3 11/28/2020   VD25OH 35.6 12/10/2019   VD25OH 35.6 02/05/2019   No results found for: WBC, HGB, HCT, MCV, PLT No results found for: IRON, TIBC, FERRITIN  Obesity Behavioral Intervention:   Approximately 15 minutes were spent on the discussion below.  ASK: We discussed the diagnosis of obesity with Meghan Diaz today and Meghan Diaz agreed to give [place] permission to  discuss obesity behavioral modification therapy today.  ASSESS: Meghan Diaz has the diagnosis of obesity and her BMI today is 33.7. Meghan Diaz is in the action stage of change.   ADVISE: Meghan Diaz was educated on the multiple health risks of obesity as well as the benefit of weight loss to improve her health. She was advised of the need for long term treatment and the importance of lifestyle modifications to improve her current health and to decrease her risk of future health problems.  AGREE: Multiple dietary modification options and treatment options were discussed and Meghan Diaz agreed to follow the recommendations documented in the above note.  ARRANGE: Meghan Diaz was educated on the importance of frequent visits to treat obesity as outlined per CMS and USPSTF guidelines and agreed to schedule her next follow up appointment today.  Attestation Statements:   Reviewed by clinician on day of visit: allergies, medications, problem list, medical history, surgical history, family history, social history, and previous encounter notes.  I, Jackson Latino, RMA, am acting as Energy manager for [employer], DO.   I have reviewed the above documentation for accuracy and completeness, and I agree with the above. Corinna Capra, DO

## 2021-02-15 ENCOUNTER — Encounter (INDEPENDENT_AMBULATORY_CARE_PROVIDER_SITE_OTHER): Payer: Self-pay | Admitting: Bariatrics

## 2021-02-27 ENCOUNTER — Encounter (INDEPENDENT_AMBULATORY_CARE_PROVIDER_SITE_OTHER): Payer: Self-pay | Admitting: Bariatrics

## 2021-02-27 ENCOUNTER — Other Ambulatory Visit: Payer: Self-pay

## 2021-02-27 ENCOUNTER — Ambulatory Visit (INDEPENDENT_AMBULATORY_CARE_PROVIDER_SITE_OTHER): Payer: Medicare HMO | Admitting: Bariatrics

## 2021-02-27 VITALS — BP 142/81 | HR 70 | Temp 97.7°F | Ht 67.0 in | Wt 216.0 lb

## 2021-02-27 DIAGNOSIS — E559 Vitamin D deficiency, unspecified: Secondary | ICD-10-CM

## 2021-02-27 DIAGNOSIS — L84 Corns and callosities: Secondary | ICD-10-CM

## 2021-02-27 DIAGNOSIS — E669 Obesity, unspecified: Secondary | ICD-10-CM

## 2021-02-27 DIAGNOSIS — M545 Low back pain, unspecified: Secondary | ICD-10-CM

## 2021-02-27 DIAGNOSIS — Z6833 Body mass index (BMI) 33.0-33.9, adult: Secondary | ICD-10-CM | POA: Diagnosis not present

## 2021-02-27 DIAGNOSIS — F5089 Other specified eating disorder: Secondary | ICD-10-CM | POA: Diagnosis not present

## 2021-02-27 DIAGNOSIS — K5909 Other constipation: Secondary | ICD-10-CM

## 2021-02-27 MED ORDER — CYCLOBENZAPRINE HCL 5 MG PO TABS
5.0000 mg | ORAL_TABLET | Freq: Three times a day (TID) | ORAL | 0 refills | Status: DC | PRN
Start: 2021-02-27 — End: 2021-06-08

## 2021-02-27 MED ORDER — POLYETHYLENE GLYCOL 3350 17 GM/SCOOP PO POWD: 17.0000 g | Freq: Every day | ORAL | 0 refills | Status: DC

## 2021-02-27 MED ORDER — VITAMIN D (ERGOCALCIFEROL) 1.25 MG (50000 UNIT) PO CAPS: 50000.0000 [IU] | ORAL_CAPSULE | ORAL | 0 refills | Status: DC

## 2021-02-28 NOTE — Progress Notes (Signed)
Chief Complaint:   OBESITY Meghan Diaz is here to discuss her progress with her obesity treatment plan along with follow-up of her obesity related diagnoses. Meghan Diaz is on the Category 2 Plan and states she is following her eating plan approximately 75% of the time. Meghan Diaz states she is doing 0 minutes 0 times per week.  Today's visit was #: 41 Starting weight: 212 lbs Starting date: 03/20/2018 Today's weight: 216 lbs Today's date: 02/27/2021 Total lbs lost to date: 0 Total lbs lost since last in-office visit: 1 lb  Interim History: Meghan Diaz is up 1 lb since her last visit.  Subjective:   1. Vitamin D deficiency Meghan Diaz is taking Vitamin D as directed.  2. Other disorder of eating Meghan Diaz is currently not on medications. She has not tolerated medications well. She has had side effects.  3. Low back pain, unspecified back pain laterality, unspecified chronicity, unspecified whether sciatica present Meghan Diaz has radiant pain with walking.  4. Other constipation Meghan Diaz is currently taking Miralax daily.  5. Callus of foot Meghan Diaz has callus on plantar surface.  Assessment/Plan:   1. Vitamin D deficiency Low Vitamin D level contributes to fatigue and are associated with obesity, breast, and colon cancer. We will refill prescription Vitamin D 50,000 IU every week for 1 month with no refills and Meghan Diaz will follow-up for routine testing of Vitamin D, at least 2-3 times per year to avoid over-replacement.  - Vitamin D, Ergocalciferol, (DRISDOL) 1.25 MG (50000 UNIT) CAPS capsule; Take 1 capsule (50,000 Units total) by mouth every 7 (seven) days.  Dispense: 4 capsule; Refill: 0  2. Other disorder of eating Meghan Diaz is working on CBT techniques to help stress eating. Behavior modification techniques were discussed today to help Meghan Diaz deal with her emotional/non-hunger eating behaviors.  Orders and follow up as documented in patient record.    3. Low back pain, unspecified back pain  laterality, unspecified chronicity, unspecified whether sciatica present We will refill Meghan Diaz's Cyclobenzaprine 5 mg by mouth 1 tablet 3 times daily for 1 month with no refills.   - cyclobenzaprine (FLEXERIL) 5 MG tablet; Take 1 tablet (5 mg total) by mouth 3 (three) times daily as needed for muscle spasms.  Dispense: 30 tablet; Refill: 0  4. Other constipation Meghan Diaz will continue taking Miralax daily 765 g with no refills.   - polyethylene glycol powder (GLYCOLAX/MIRALAX) 17 GM/SCOOP powder; Take 17 g by mouth daily.  Dispense: 765 g; Refill: 0  5. Callus of foot We will send a referral to Podiatrist ([facility]). Dr. Samuella Cota or other.  - Ambulatory referral to Podiatry  6. Obesity, current BMI 33.9 Meghan Diaz is currently in the action stage of change. As such, her goal is to continue with weight loss efforts. She has agreed to the Category 2 Plan.   Meghan Diaz will continue to stick closely to the plan. She will increase protein and exercise.  Exercise goals: No exercise has been prescribed at this time. Meghan Diaz has callus on the bottom of her foot and she has lower back pain.  Behavioral modification strategies: increasing lean protein intake, decreasing simple carbohydrates, increasing vegetables, increasing water intake, decreasing eating out, no skipping meals, meal planning and cooking strategies, keeping healthy foods in the home, and planning for success.  Meghan Diaz has agreed to follow-up with our clinic in 2-3 weeks. She was informed of the importance of frequent follow-up visits to maximize her success with intensive lifestyle modifications for her multiple health conditions.  Objective:   Blood pressure (!) 142/81, pulse 70, temperature 97.7 F (36.5 C), height 5\' 7"  (1.702 m), weight 216 lb (98 kg), SpO2 97 %. Body mass index is 33.83 kg/m.  General: Cooperative, alert, well developed, in no acute distress. HEENT: Conjunctivae and lids  unremarkable. Cardiovascular: Regular rhythm.  Lungs: Normal work of breathing. Neurologic: No focal deficits.   Lab Results  Component Value Date   CREATININE 0.80 11/28/2020   BUN 13 11/28/2020   NA 138 11/28/2020   K 4.7 11/28/2020   CL 100 11/28/2020   CO2 23 11/28/2020   Lab Results  Component Value Date   ALT 13 11/28/2020   AST 22 11/28/2020   ALKPHOS 86 11/28/2020   BILITOT 0.4 11/28/2020   Lab Results  Component Value Date   HGBA1C 5.5 02/05/2019   HGBA1C 5.5 08/12/2018   HGBA1C 5.6 03/20/2018   Lab Results  Component Value Date   INSULIN 6.8 02/05/2019   INSULIN 17.5 08/12/2018   INSULIN 10.4 03/20/2018   Lab Results  Component Value Date   TSH 3.120 11/28/2020   Lab Results  Component Value Date   CHOL 246 (H) 12/10/2019   HDL 99 12/10/2019   LDLCALC 136 (H) 12/10/2019   TRIG 69 12/10/2019   Lab Results  Component Value Date   VD25OH 44.3 11/28/2020   VD25OH 35.6 12/10/2019   VD25OH 35.6 02/05/2019   No results found for: WBC, HGB, HCT, MCV, PLT No results found for: IRON, TIBC, FERRITIN  Obesity Behavioral Intervention:   Approximately 15 minutes were spent on the discussion below.  ASK: We discussed the diagnosis of obesity with Meghan Diaz today and Meghan Diaz agreed to give 02/07/2019 permission to discuss obesity behavioral modification therapy today.  ASSESS: Meghan Diaz has the diagnosis of obesity and her BMI today is 33.9. Meghan Diaz is in the action stage of change.   ADVISE: Meghan Diaz was educated on the multiple health risks of obesity as well as the benefit of weight loss to improve her health. She was advised of the need for long term treatment and the importance of lifestyle modifications to improve her current health and to decrease her risk of future health problems.  AGREE: Multiple dietary modification options and treatment options were discussed and Meghan Diaz agreed to follow the recommendations documented in the above note.  ARRANGE: Meghan Diaz was  educated on the importance of frequent visits to treat obesity as outlined per CMS and USPSTF guidelines and agreed to schedule her next follow up appointment today.  Attestation Statements:   Reviewed by clinician on day of visit: allergies, medications, problem list, medical history, surgical history, family history, social history, and previous encounter notes.   I, Inez Catalina, RMA, am acting as Jackson Latino for Energy manager, DO.   I have reviewed the above documentation for accuracy and completeness, and I agree with the above. [employer], DO

## 2021-03-03 ENCOUNTER — Ambulatory Visit: Payer: Medicare HMO | Admitting: Podiatrist

## 2021-03-03 ENCOUNTER — Other Ambulatory Visit: Payer: Self-pay

## 2021-03-03 ENCOUNTER — Encounter: Payer: Self-pay | Admitting: Podiatrist

## 2021-03-03 DIAGNOSIS — L84 Corns and callosities: Secondary | ICD-10-CM | POA: Diagnosis not present

## 2021-03-03 DIAGNOSIS — M216X2 Other acquired deformities of left foot: Secondary | ICD-10-CM | POA: Diagnosis not present

## 2021-03-03 NOTE — Progress Notes (Signed)
Chief Complaint  Patient presents with   Callouses    Callus at LT 3rd sub met x off and on for months - little burning - no redness/swelling -worse in Summer -better with shoes and socks tx: pucmice stone and scrapping -     HPI: Patient is 80 y.o. female who presents today for the concerns as listed above.  She relates a callus on the bottom aspect of the left foot on and off for months.  She states that when she has it trimmed at the [place] it starts to hurt afterwards and she does not want it trimmed today.  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     cyclobenzaprine (FLEXERIL) 5 MG tablet Take 1 tablet (5 mg total) by mouth 3 (three) times daily as needed for muscle spasms. 30 tablet 0   levothyroxine (SYNTHROID) 75 MCG tablet Take 1 tablet (75 mcg total) by mouth daily before breakfast. 30 tablet 2   losartan-hydrochlorothiazide (HYZAAR) 50-12.5 MG tablet Take 1 tablet by mouth daily.     Vitamin D, Ergocalciferol, (DRISDOL) 1.25 MG (50000 UNIT) CAPS capsule Take 1 capsule (50,000 Units total) by mouth every 7 (seven) days. 4 capsule 0   No current facility-administered medications on file prior to visit.    Allergies  Allergen Reactions   Codeine Nausea And Vomiting   Molds & Smuts Rash   Penicillins Rash    MOLD derivative; never taken pcn before (allergic to mold)    Review of Systems No fevers, chills, nausea, muscle aches, no difficulty breathing, no calf pain, no chest  pain or shortness of breath.   Physical Exam  GENERAL APPEARANCE: Alert, conversant. Appropriately groomed. No acute distress.   VASCULAR: Pedal pulses palpable DP and PT bilateral.  Capillary refill time is immediate to all digits,  Proximal to distal cooling it warm to warm.  Digital perfusion adequate.   NEUROLOGIC: sensation is intact to 5.07 monofilament at 5/5 sites bilateral.  Light touch is intact bilateral, vibratory sensation intact bilateral  MUSCULOSKELETAL: acceptable muscle strength, tone and stability bilateral.  Prominent and plantarflexed second and third metatarsals are noted on the left foot with fat pad atrophy on the forefoot plantarly noted bilateral.  DERMATOLOGIC: skin is warm, supple, and dry.  Hyperkeratotic callus tissue is present submetatarsal 3 of the left foot.  It is moderate in thickness.  Some pain with pressure is noted skin tension lines are noted throughout.   Assessment   1. Prominent metatarsal head of left foot   2. Callus of foot      Plan  Discussed exam findings with the patient.  Discussed that she has lost some of the fat on the bottom of the feet and this will cause these callus type formation to form.  I recommended offloading pads in place when in her shoe to try at today's visit.  Discussed she can shave the lesion herself with a pumice stone down to a comfortable  level.  And recommended some creams with light acid in them to help it along.  She will try these conservative treatments and she if she continues having pain she will call otherwise she will be seen back as needed for follow-up.

## 2021-03-03 NOTE — Patient Instructions (Signed)
Corns and Calluses Corns are small areas of thickened skin that form on the top, sides, or tip of a toe. Corns have a cone-shaped core with a point that can press on a nerve below. This causes pain. Calluses are areas of thickened skin that can form anywhere on the body, including the hands, fingers, palms, soles of the feet, and heels. Calluses are usually larger than corns. What are the causes? Corns and calluses are caused by rubbing (friction) or pressure, such as from shoes that are too tight or do not fit properly. What increases the risk? Corns are more likely to develop in people who have misshapen toes (toe deformities), such as hammer toes. Calluses can form with friction to any area of the skin. They are more likely to develop in people who: Work with their hands. Wear shoes that fit poorly, are too tight, or are high-heeled. Have toe deformities. What are the signs or symptoms? Symptoms of a corn or callus include: A hard growth on the skin. Pain or tenderness under the skin. Redness and swelling. Increased discomfort while wearing tight-fitting shoes, if your feet are affected. If a corn or callus becomes infected, symptoms may include: Redness and swelling that gets worse. Pain. Fluid, blood, or pus draining from the corn or callus. How is this diagnosed? Corns and calluses may be diagnosed based on your symptoms, your medical history, and a physical exam. How is this treated? Treatment for corns and calluses may include: Removing the cause of the friction or pressure. This may involve: Changing your shoes. Wearing shoe inserts (orthotics) or other protective layers in your shoes, such as a corn pad. Wearing gloves. Applying medicine to the skin (topical medicine) to help soften skin in the hardened, thickened areas. Removing layers of dead skin with a file to reduce the size of the corn or callus. Removing the corn or callus with a scalpel or laser. Taking antibiotic  medicines, if your corn or callus is infected. Having surgery, if a toe deformity is the cause. Follow these instructions at home:  Take over-the-counter and prescription medicines only as told by your health care provider. If you were prescribed an antibiotic medicine, take it as told by your health care provider. Do not stop taking it even if your condition improves. Wear shoes that fit well. Avoid wearing high-heeled shoes and shoes that are too tight or too loose. Wear any padding, protective layers, gloves, or orthotics as told by your health care provider. Soak your hands or feet. Then use a file or pumice stone to soften your corn or callus. Do this as told by your health care provider. Check your corn or callus every day for signs of infection. Contact a health care provider if: Your symptoms do not improve with treatment. You have redness or swelling that gets worse. Your corn or callus becomes painful. You have fluid, blood, or pus coming from your corn or callus. You have new symptoms. Get help right away if: You develop severe pain with redness. Summary Corns are small areas of thickened skin that form on the top, sides, or tip of a toe. These can be painful. Calluses are areas of thickened skin that can form anywhere on the body, including the hands, fingers, palms, and soles of the feet. Calluses are usually larger than corns. Corns and calluses are caused by rubbing (friction) or pressure, such as from shoes that are too tight or do not fit properly. Treatment may include wearing padding, protective   layers, gloves, or orthotics as told by your health care provider. This information is not intended to replace advice given to you by your health care provider. Make sure you discuss any questions you have with your health care provider. Document Revised: 10/22/2019 Document Reviewed: 10/22/2019 Elsevier Patient Education  2022 [employer]  

## 2021-03-04 ENCOUNTER — Other Ambulatory Visit (INDEPENDENT_AMBULATORY_CARE_PROVIDER_SITE_OTHER): Payer: Self-pay | Admitting: Bariatrics

## 2021-03-04 DIAGNOSIS — M545 Low back pain, unspecified: Secondary | ICD-10-CM

## 2021-03-06 NOTE — Telephone Encounter (Signed)
Dr.Brown 

## 2021-03-21 ENCOUNTER — Ambulatory Visit (INDEPENDENT_AMBULATORY_CARE_PROVIDER_SITE_OTHER): Payer: Medicare HMO | Admitting: Bariatrics

## 2021-03-22 ENCOUNTER — Other Ambulatory Visit (INDEPENDENT_AMBULATORY_CARE_PROVIDER_SITE_OTHER): Payer: Self-pay | Admitting: Bariatrics

## 2021-03-22 DIAGNOSIS — E559 Vitamin D deficiency, unspecified: Secondary | ICD-10-CM

## 2021-03-22 NOTE — Telephone Encounter (Signed)
Last OV with Dr Brown 

## 2021-06-08 ENCOUNTER — Encounter (INDEPENDENT_AMBULATORY_CARE_PROVIDER_SITE_OTHER): Payer: Self-pay | Admitting: Family Medicine

## 2021-06-08 ENCOUNTER — Other Ambulatory Visit: Payer: Self-pay

## 2021-06-08 ENCOUNTER — Ambulatory Visit (INDEPENDENT_AMBULATORY_CARE_PROVIDER_SITE_OTHER): Payer: Medicare HMO | Admitting: Family Medicine

## 2021-06-08 VITALS — BP 162/78 | HR 69 | Temp 98.1°F | Ht 67.0 in | Wt 222.0 lb

## 2021-06-08 DIAGNOSIS — Z6833 Body mass index (BMI) 33.0-33.9, adult: Secondary | ICD-10-CM

## 2021-06-08 DIAGNOSIS — I10 Essential (primary) hypertension: Secondary | ICD-10-CM | POA: Diagnosis not present

## 2021-06-08 DIAGNOSIS — E669 Obesity, unspecified: Secondary | ICD-10-CM

## 2021-06-08 DIAGNOSIS — E559 Vitamin D deficiency, unspecified: Secondary | ICD-10-CM | POA: Diagnosis not present

## 2021-06-08 MED ORDER — VITAMIN D (ERGOCALCIFEROL) 1.25 MG (50000 UNIT) PO CAPS: 50000.0000 [IU] | ORAL_CAPSULE | ORAL | 0 refills | Status: AC

## 2021-06-08 NOTE — Progress Notes (Signed)
Chief Complaint:   OBESITY Meghan Diaz is here to discuss her progress with her obesity treatment plan along with follow-up of her obesity related diagnoses. Akaiya is on the Category 2 Plan and states she is following her eating plan approximately 0% of the time. Prabhleen states she is doing 0 minutes 0 times per week.  Today's visit was #: 42 Starting weight: 212 lbs Starting date: 03/20/2018 Today's weight: 222 lbs Today's date: 06/08/2021 Total lbs lost to date: 0 Total lbs lost since last in-office visit: 0  Interim History: Meghan Diaz has been off track. She is trying to get back to something structured, but she is especially struggling to eat protein.  Subjective:   1. Essential hypertension Meghan Diaz's blood pressure is elevated today, even on losartan. This may be due to anxiety.  2. Vitamin D deficiency Meghan Diaz is stable on Vit D, and she is due for labs soon.  Assessment/Plan:   1. Essential hypertension Clementina will continue her diet and medications to improve blood pressure control. We will recheck her blood pressure in 2-3 weeks.  2. Vitamin D deficiency Low Vitamin D level contributes to fatigue and are associated with obesity, breast, and colon cancer. We will refill prescription Vitamin D for 1 month, and we will recheck labs at her next visit. Rayya will follow-up for routine testing of Vitamin D, at least 2-3 times per year to avoid over-replacement.  - Vitamin D, Ergocalciferol, (DRISDOL) 1.25 MG (50000 UNIT) CAPS capsule; Take 1 capsule (50,000 Units total) by mouth every 7 (seven) days.  Dispense: 4 capsule; Refill: 0  3. Obesity with current BMI 34.8 Meghan Diaz is currently in the action stage of change. As such, her goal is to continue with weight loss efforts. She has agreed to keeping a food journal and adhering to recommended goals of 1000-1300 calories and 75+ grams of protein daily.   We will recheck fasting labs at her next visit.  Higher protein food list was  given, especially with plant based protein.  Behavioral modification strategies: increasing lean protein intake.  Meghan Diaz has agreed to follow-up with our clinic in 3 weeks. She was informed of the importance of frequent follow-up visits to maximize her success with intensive lifestyle modifications for her multiple health conditions.   Objective:   Blood pressure (!) 162/78, pulse 69, temperature 98.1 F (36.7 C), height 5\' 7"  (1.702 m), weight 222 lb (100.7 kg), SpO2 99 %. Body mass index is 34.77 kg/m.  General: Cooperative, alert, well developed, in no acute distress. HEENT: Conjunctivae and lids unremarkable. Cardiovascular: Regular rhythm.  Lungs: Normal work of breathing. Neurologic: No focal deficits.   Lab Results  Component Value Date   CREATININE 0.80 11/28/2020   BUN 13 11/28/2020   NA 138 11/28/2020   K 4.7 11/28/2020   CL 100 11/28/2020   CO2 23 11/28/2020   Lab Results  Component Value Date   ALT 13 11/28/2020   AST 22 11/28/2020   ALKPHOS 86 11/28/2020   BILITOT 0.4 11/28/2020   Lab Results  Component Value Date   HGBA1C 5.5 02/05/2019   HGBA1C 5.5 08/12/2018   HGBA1C 5.6 03/20/2018   Lab Results  Component Value Date   INSULIN 6.8 02/05/2019   INSULIN 17.5 08/12/2018   INSULIN 10.4 03/20/2018   Lab Results  Component Value Date   TSH 3.120 11/28/2020   Lab Results  Component Value Date   CHOL 246 (H) 12/10/2019   HDL 99 12/10/2019   LDLCALC 136 (  H) 12/10/2019   TRIG 69 12/10/2019   Lab Results  Component Value Date   VD25OH 44.3 11/28/2020   VD25OH 35.6 12/10/2019   VD25OH 35.6 02/05/2019   No results found for: WBC, HGB, HCT, MCV, PLT No results found for: IRON, TIBC, FERRITIN  Obesity Behavioral Intervention:   Approximately 15 minutes were spent on the discussion below.  ASK: We discussed the diagnosis of obesity with Xandrea today and Amrutha agreed to give [place] permission to discuss obesity behavioral modification therapy  today.  ASSESS: Richell has the diagnosis of obesity and her BMI today is 34.8. Tonna is in the action stage of change.   ADVISE: Florance was educated on the multiple health risks of obesity as well as the benefit of weight loss to improve her health. She was advised of the need for long term treatment and the importance of lifestyle modifications to improve her current health and to decrease her risk of future health problems.  AGREE: Multiple dietary modification options and treatment options were discussed and Ahmiya agreed to follow the recommendations documented in the above note.  ARRANGE: Breon was educated on the importance of frequent visits to treat obesity as outlined per CMS and USPSTF guidelines and agreed to schedule her next follow up appointment today.  Attestation Statements:   Reviewed by clinician on day of visit: allergies, medications, problem list, medical history, surgical history, family history, social history, and previous encounter notes.   I, Burt Knack, am acting as transcriptionist for Quillian Quince, MD.  I have reviewed the above documentation for accuracy and completeness, and I agree with the above. -  Quillian Quince, MD

## 2021-06-22 ENCOUNTER — Other Ambulatory Visit: Payer: Self-pay

## 2021-06-22 ENCOUNTER — Encounter (INDEPENDENT_AMBULATORY_CARE_PROVIDER_SITE_OTHER): Payer: Self-pay | Admitting: Family Medicine

## 2021-06-22 ENCOUNTER — Ambulatory Visit (INDEPENDENT_AMBULATORY_CARE_PROVIDER_SITE_OTHER): Payer: Medicare HMO | Admitting: Family Medicine

## 2021-06-22 VITALS — BP 163/53 | HR 63 | Temp 97.9°F | Ht 67.0 in | Wt 217.0 lb

## 2021-06-22 DIAGNOSIS — I10 Essential (primary) hypertension: Secondary | ICD-10-CM | POA: Diagnosis not present

## 2021-06-22 DIAGNOSIS — F419 Anxiety disorder, unspecified: Secondary | ICD-10-CM

## 2021-06-22 DIAGNOSIS — E669 Obesity, unspecified: Secondary | ICD-10-CM

## 2021-06-22 DIAGNOSIS — Z6833 Body mass index (BMI) 33.0-33.9, adult: Secondary | ICD-10-CM | POA: Diagnosis not present

## 2021-06-22 NOTE — Progress Notes (Signed)
Chief Complaint:   OBESITY Meghan Diaz is here to discuss her progress with her obesity treatment plan along with follow-up of her obesity related diagnoses. Meghan Diaz is on keeping a food journal and adhering to recommended goals of 1000-1300 calories and 75+ grams of protein daily and states she is following her eating plan approximately 50-60% of the time. Meghan Diaz states she is doing 0 minutes 0 times per week.  Today's visit was #: 43 Starting weight: 212 lbs Starting date: 03/20/2018 Today's weight: 217 lbs Today's date: 06/22/2021 Total lbs lost to date: 0 Total lbs lost since last in-office visit: 5  Interim History: Meghan Diaz has done well with weight loss. She notes the December months are very stressful and emotional for her. She is feeling deprived at times.  Subjective:   1. Essential hypertension Meghan Diaz's stress is elevated today. She is working on diet and exercise. She denies chest pain or headache.  2. Anxiety Meghan Diaz is feeling isolated as she has no family in the area. She notes emotional eating increases when she feel lonely.  Assessment/Plan:   1. Essential hypertension Meghan Diaz will continue with diet and exercise to improve blood pressure control. We will recheck her blood pressure in 3 weeks.  2. Anxiety Cognitive behavioral therapy to help Meghan Diaz recognize and moderate her anxiety was discussed today. She will continue to follow up as directed. Orders and follow up as documented in patient record.   3. Obesity with current BMI of 34.0 Meghan Diaz is currently in the action stage of change. As such, her goal is to continue with weight loss efforts. She has agreed to keeping a food journal and adhering to recommended goals of 1000-1300 calories and 75+ grams of protein daily.   Behavioral modification strategies: increasing lean protein intake and holiday eating strategies .  Meghan Diaz has agreed to follow-up with our clinic in 3 weeks. She was informed of the importance of  frequent follow-up visits to maximize her success with intensive lifestyle modifications for her multiple health conditions.   Objective:   Blood pressure (!) 163/53, pulse 63, temperature 97.9 F (36.6 C), height 5\' 7"  (1.702 m), weight 217 lb (98.4 kg), SpO2 97 %. Body mass index is 33.99 kg/m.  General: Cooperative, alert, well developed, in no acute distress. HEENT: Conjunctivae and lids unremarkable. Cardiovascular: Regular rhythm.  Lungs: Normal work of breathing. Neurologic: No focal deficits.   Lab Results  Component Value Date   CREATININE 0.80 11/28/2020   BUN 13 11/28/2020   NA 138 11/28/2020   K 4.7 11/28/2020   CL 100 11/28/2020   CO2 23 11/28/2020   Lab Results  Component Value Date   ALT 13 11/28/2020   AST 22 11/28/2020   ALKPHOS 86 11/28/2020   BILITOT 0.4 11/28/2020   Lab Results  Component Value Date   HGBA1C 5.5 02/05/2019   HGBA1C 5.5 08/12/2018   HGBA1C 5.6 03/20/2018   Lab Results  Component Value Date   INSULIN 6.8 02/05/2019   INSULIN 17.5 08/12/2018   INSULIN 10.4 03/20/2018   Lab Results  Component Value Date   TSH 3.120 11/28/2020   Lab Results  Component Value Date   CHOL 246 (H) 12/10/2019   HDL 99 12/10/2019   LDLCALC 136 (H) 12/10/2019   TRIG 69 12/10/2019   Lab Results  Component Value Date   VD25OH 44.3 11/28/2020   VD25OH 35.6 12/10/2019   VD25OH 35.6 02/05/2019   No results found for: WBC, HGB, HCT, MCV, PLT No  results found for: IRON, TIBC, FERRITIN  Attestation Statements:   Reviewed by clinician on day of visit: allergies, medications, problem list, medical history, surgical history, family history, social history, and previous encounter notes.  Time spent on visit including pre-visit chart review and post-visit care and charting was 25 minutes.    I, Burt Knack, am acting as transcriptionist for Quillian Quince, MD.  I have reviewed the above documentation for accuracy and completeness, and I agree with  the above. -  Quillian Quince, MD

## 2021-07-05 ENCOUNTER — Emergency Department (HOSPITAL_COMMUNITY): Payer: Medicare HMO

## 2021-07-05 ENCOUNTER — Other Ambulatory Visit: Payer: Self-pay

## 2021-07-05 ENCOUNTER — Emergency Department (HOSPITAL_COMMUNITY)
Admission: EM | Admit: 2021-07-05 | Discharge: 2021-07-06 | Disposition: A | Discharge status: Home / Self Care | Payer: Medicare HMO | Attending: Emergency Medicine | Admitting: Emergency Medicine

## 2021-07-05 DIAGNOSIS — U071 COVID-19: Secondary | ICD-10-CM | POA: Diagnosis not present

## 2021-07-05 DIAGNOSIS — R519 Headache, unspecified: Secondary | ICD-10-CM | POA: Diagnosis present

## 2021-07-05 DIAGNOSIS — E039 Hypothyroidism, unspecified: Secondary | ICD-10-CM | POA: Diagnosis not present

## 2021-07-05 DIAGNOSIS — I6523 Occlusion and stenosis of bilateral carotid arteries: Secondary | ICD-10-CM | POA: Diagnosis not present

## 2021-07-05 DIAGNOSIS — I10 Essential (primary) hypertension: Secondary | ICD-10-CM | POA: Diagnosis not present

## 2021-07-05 DIAGNOSIS — Z79899 Other long term (current) drug therapy: Secondary | ICD-10-CM | POA: Diagnosis not present

## 2021-07-05 DIAGNOSIS — H539 Unspecified visual disturbance: Secondary | ICD-10-CM | POA: Diagnosis not present

## 2021-07-05 DIAGNOSIS — H3411 Central retinal artery occlusion, right eye: Secondary | ICD-10-CM

## 2021-07-05 LAB — DIFFERENTIAL
Abs Immature Granulocytes: 0.02 10*3/uL (ref 0.00–0.07)
Basophils Absolute: 0 10*3/uL (ref 0.0–0.1)
Basophils Relative: 0 %
Eosinophils Absolute: 0.1 10*3/uL (ref 0.0–0.5)
Eosinophils Relative: 1 %
Immature Granulocytes: 0 %
Lymphocytes Relative: 22 %
Lymphs Abs: 1.6 10*3/uL (ref 0.7–4.0)
Monocytes Absolute: 0.5 10*3/uL (ref 0.1–1.0)
Monocytes Relative: 7 %
Neutro Abs: 5.2 10*3/uL (ref 1.7–7.7)
Neutrophils Relative %: 70 %

## 2021-07-05 LAB — I-STAT CHEM 8, ED
BUN: 20 mg/dL (ref 8–23)
Calcium, Ion: 1.18 mmol/L (ref 1.15–1.40)
Chloride: 103 mmol/L (ref 98–111)
Creatinine, Ser: 0.7 mg/dL (ref 0.44–1.00)
Glucose, Bld: 87 mg/dL (ref 70–99)
HCT: 47 % — ABNORMAL HIGH (ref 36.0–46.0)
Hemoglobin: 16 g/dL — ABNORMAL HIGH (ref 12.0–15.0)
Potassium: 3.7 mmol/L (ref 3.5–5.1)
Sodium: 138 mmol/L (ref 135–145)
TCO2: 27 mmol/L (ref 22–32)

## 2021-07-05 LAB — CBC
HCT: 45.4 % (ref 36.0–46.0)
Hemoglobin: 14.5 g/dL (ref 12.0–15.0)
MCH: 30.5 pg (ref 26.0–34.0)
MCHC: 31.9 g/dL (ref 30.0–36.0)
MCV: 95.6 fL (ref 80.0–100.0)
Platelets: 244 10*3/uL (ref 150–400)
RBC: 4.75 MIL/uL (ref 3.87–5.11)
RDW: 13.1 % (ref 11.5–15.5)
WBC: 7.4 10*3/uL (ref 4.0–10.5)
nRBC: 0 % (ref 0.0–0.2)

## 2021-07-05 LAB — PROTIME-INR
INR: 1 (ref 0.8–1.2)
Prothrombin Time: 12.8 seconds (ref 11.4–15.2)

## 2021-07-05 LAB — URINALYSIS, ROUTINE W REFLEX MICROSCOPIC
Bilirubin Urine: NEGATIVE
Glucose, UA: NEGATIVE mg/dL
Hgb urine dipstick: NEGATIVE
Ketones, ur: NEGATIVE mg/dL
Nitrite: NEGATIVE
Protein, ur: NEGATIVE mg/dL
Specific Gravity, Urine: 1.009 (ref 1.005–1.030)
pH: 6 (ref 5.0–8.0)

## 2021-07-05 LAB — RAPID URINE DRUG SCREEN, HOSP PERFORMED
Amphetamines: NOT DETECTED
Barbiturates: NOT DETECTED
Benzodiazepines: NOT DETECTED
Cocaine: NOT DETECTED
Opiates: NOT DETECTED
Tetrahydrocannabinol: NOT DETECTED

## 2021-07-05 LAB — COMPREHENSIVE METABOLIC PANEL
ALT: 21 U/L (ref 0–44)
AST: 27 U/L (ref 15–41)
Albumin: 4.3 g/dL (ref 3.5–5.0)
Alkaline Phosphatase: 65 U/L (ref 38–126)
Anion gap: 11 (ref 5–15)
BUN: 17 mg/dL (ref 8–23)
CO2: 26 mmol/L (ref 22–32)
Calcium: 9.8 mg/dL (ref 8.9–10.3)
Chloride: 100 mmol/L (ref 98–111)
Creatinine, Ser: 0.8 mg/dL (ref 0.44–1.00)
GFR, Estimated: >60 mL/min (ref >60)
Glucose, Bld: 91 mg/dL (ref 70–99)
Potassium: 3.8 mmol/L (ref 3.5–5.1)
Sodium: 137 mmol/L (ref 135–145)
Total Bilirubin: 0.8 mg/dL (ref 0.3–1.2)
Total Protein: 7.6 g/dL (ref 6.5–8.1)

## 2021-07-05 LAB — RESP PANEL BY RT-PCR (FLU A&B, COVID) ARPGX2
Influenza A by PCR: NEGATIVE
Influenza B by PCR: NEGATIVE
SARS Coronavirus 2 by RT PCR: POSITIVE — AB

## 2021-07-05 LAB — APTT: aPTT: 29 seconds (ref 24–36)

## 2021-07-05 LAB — ETHANOL: Alcohol, Ethyl (B): <10 mg/dL (ref <10)

## 2021-07-05 IMAGING — CT CT HEAD W/O CM
3 series · 16 of 47 positions shown, 19 images · non-contrast
Comparison: None.

CLINICAL DATA: Vision loss.

EXAM:
CT HEAD WITHOUT CONTRAST
TECHNIQUE: Contiguous axial images were obtained from the base of the skull
through the vertex without intravenous contrast.

[Series 3: head 5.0 h30s · axial · 0.41mm/px · z∈[-211,-81]mm · 10 of 32 slices shown, 13 images]
[im 3/32  brain]
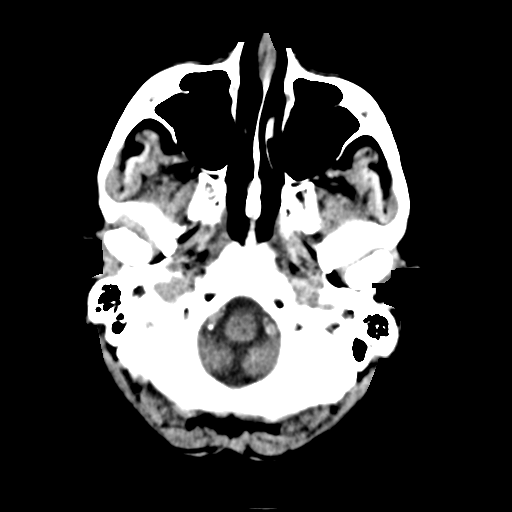
[im 3/32  bone]
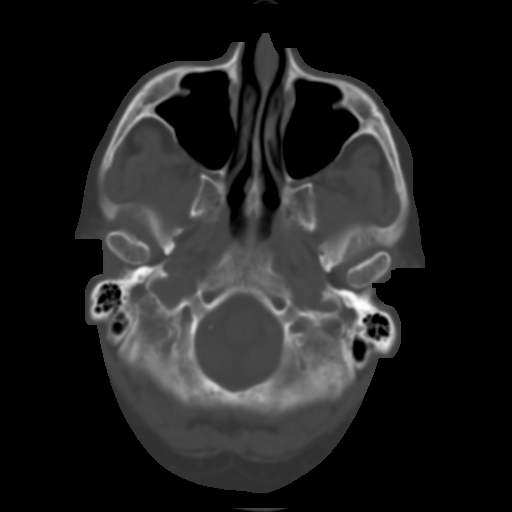
[im 6/32  brain]
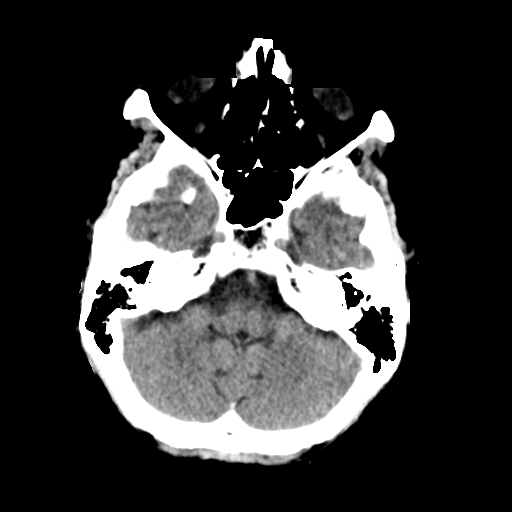
[im 9/32  brain]
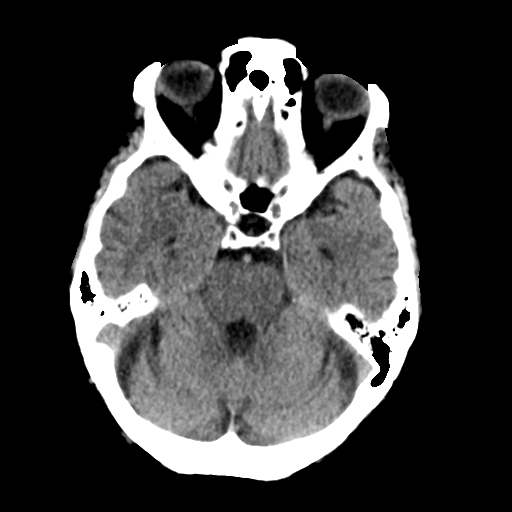
[im 11/32  brain]
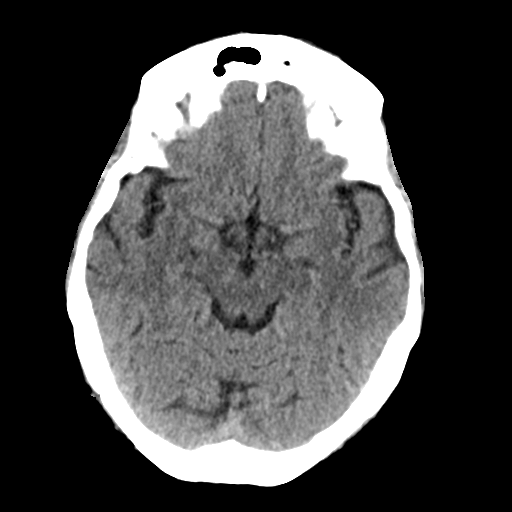
[im 14/32  brain]
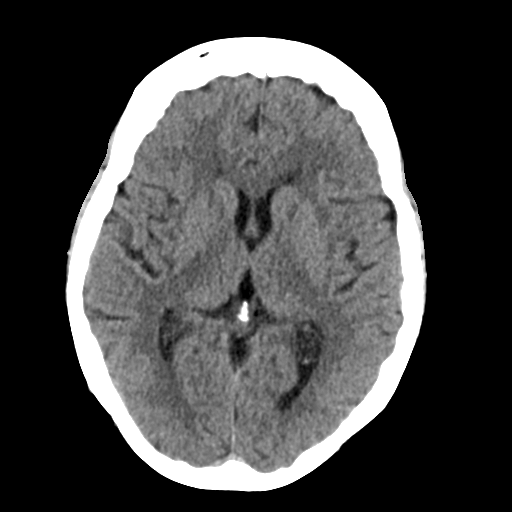
[im 14/32  bone]
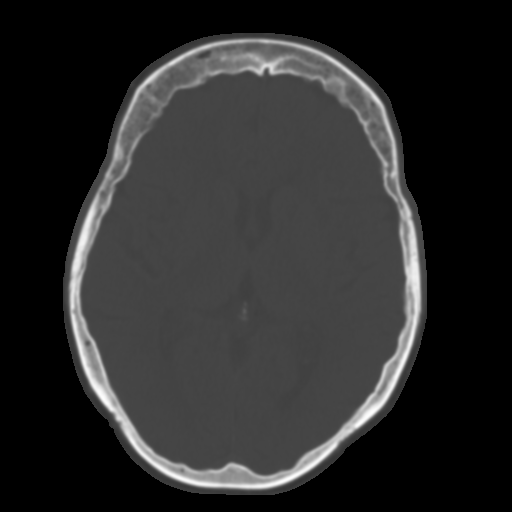
[im 18/32  brain]
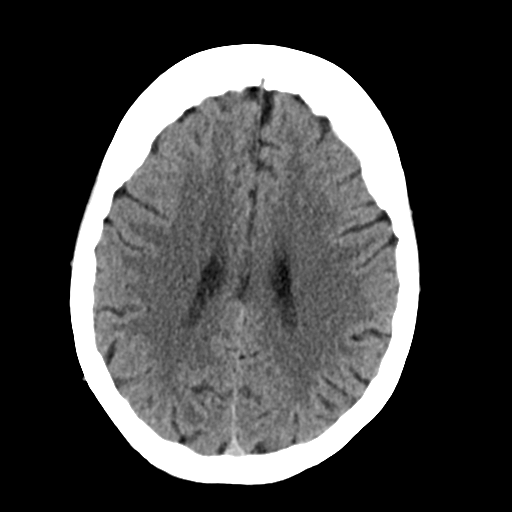
[im 21/32  brain]
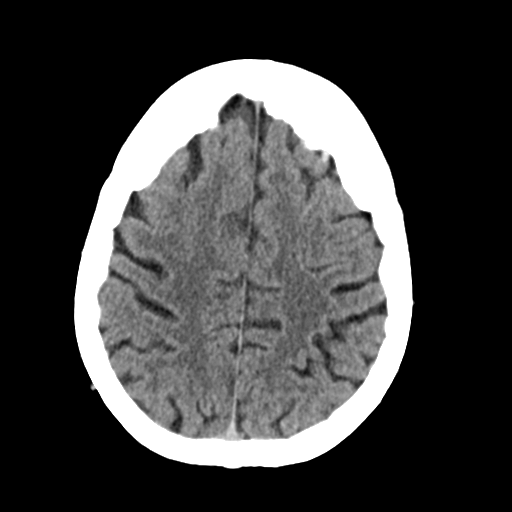
[im 24/32  brain]
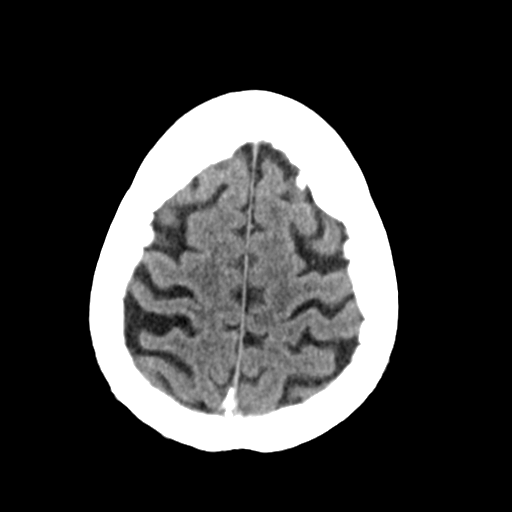
[im 26/32  brain]
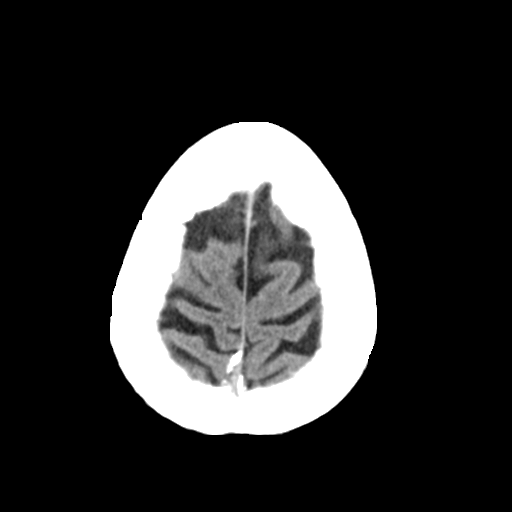
[im 26/32  bone]
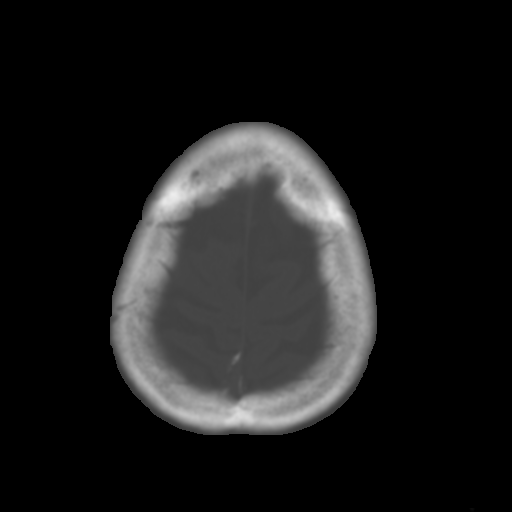
[im 29/32  brain]
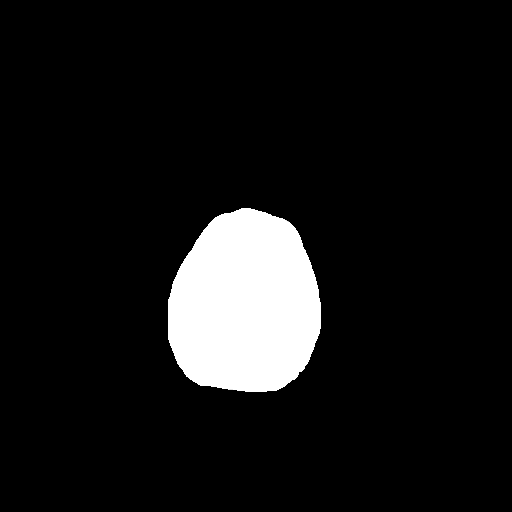

[Series 5: head 3.0 mpr cor · coronal · 0.34mm/px · 3 of 68 slices shown]
[im 23/68  brain]
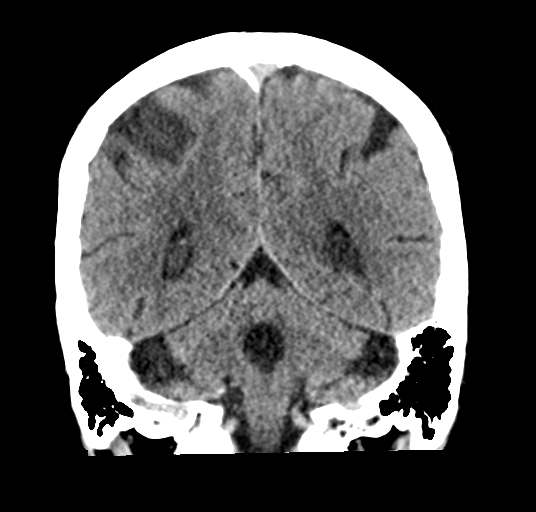
[im 30/68  brain]
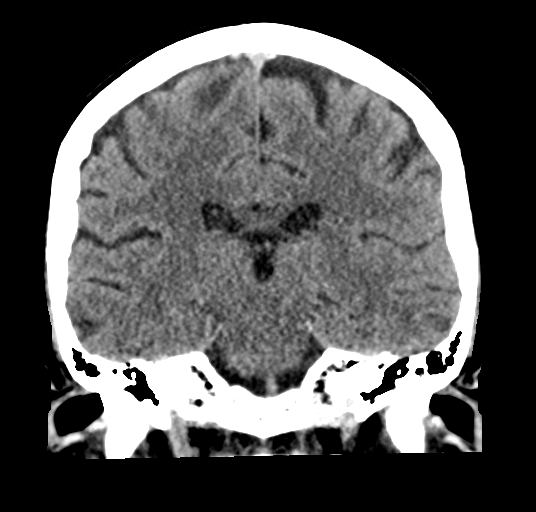
[im 38/68  brain]
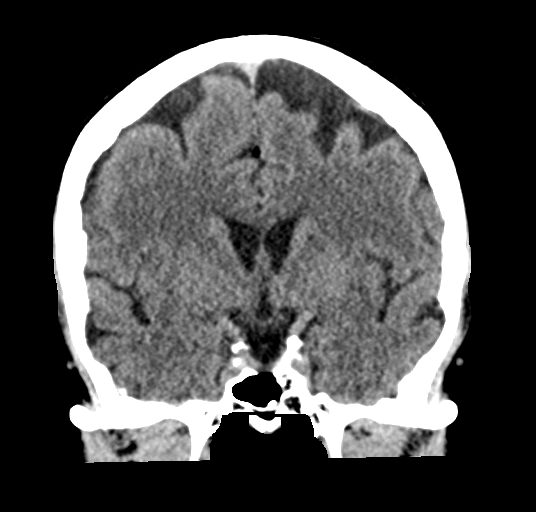

[Series 6: head 3.0 mpr sag · sagittal · 0.31mm/px · 3 of 60 slices shown]
[im 20/60  brain]
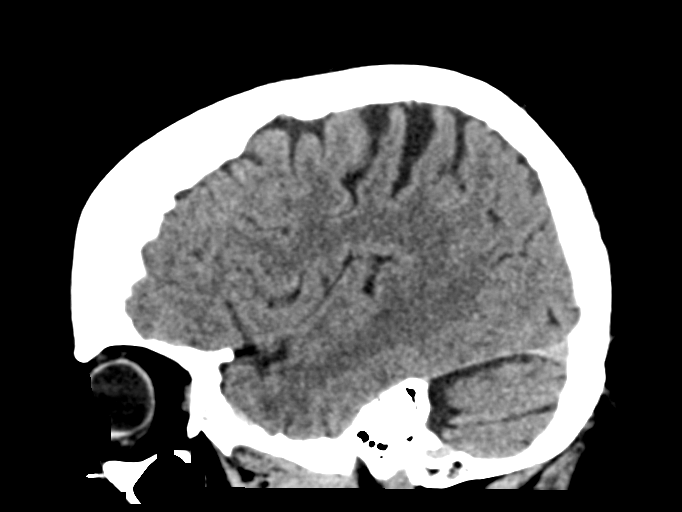
[im 30/60  brain]
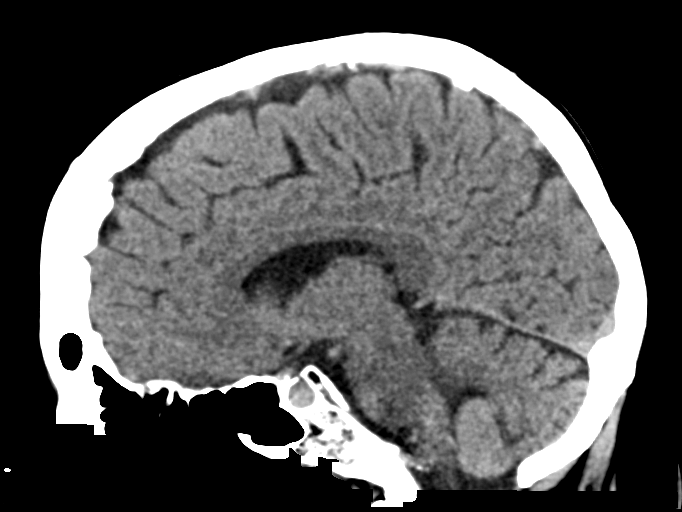
[im 40/60  brain]
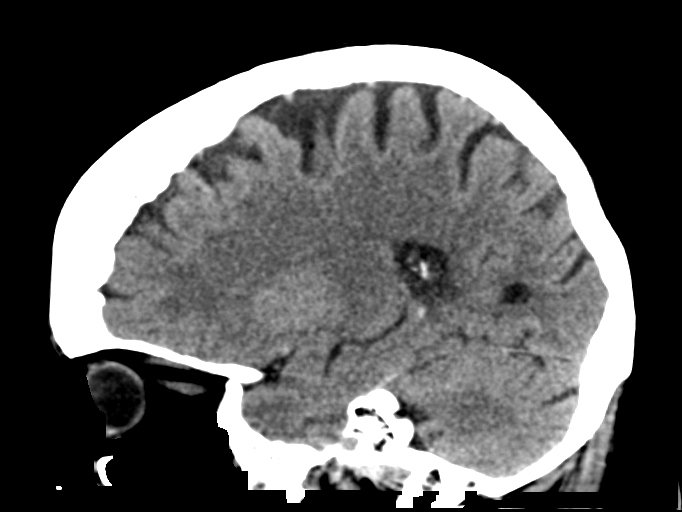

[16 of 47 positions shown; findings below may reference images not displayed]

FINDINGS: Brain: No evidence of acute infarction, hemorrhage, hydrocephalus,
extra-axial collection or mass lesion/mass effect.

Vascular: Vascular calcifications but no aneurysm or hyperdense
vessels.

Skull: No skull fracture or bone lesions. Moderate hyperostosis
frontalis interna.

Sinuses/Orbits: The paranasal sinuses and mastoid air cells are
clear. The globes are intact.

Other: No scalp lesions or scalp hematoma.
IMPRESSION: No acute intracranial findings or mass lesions.

## 2021-07-05 IMAGING — MR MR MRA HEAD W/O CM
1 series · 18 of 48 positions shown · IV contrast (gadavist)
Comparison: No pertinent prior exam.

CLINICAL DATA: Monocular vision loss

EXAM:
MRI HEAD WITHOUT AND WITH CONTRAST
MRA HEAD WITHOUT CONTRAST
MRA OF THE NECK WITHOUT AND WITH CONTRAST
TECHNIQUE: Multiplanar, multi-echo pulse sequences of the brain and surrounding
structures were acquired without and with intravenous contrast.
Angiographic images of the Circle of Willis were acquired using MRA
technique without intravenous contrast. Angiographic images of the
neck were acquired using MRA technique without and with intravenous
contrast. Carotid stenosis measurements (when applicable) are
obtained utilizing NASCET criteria, using the distal internal
carotid diameter as the denominator.
CONTRAST:  10mL GADAVIST GADOBUTROL 1 MMOL/ML IV SOLN

[Series 9: 3d cow · axial · 0.5mm · 0.41mm/px · z∈[-64,+17]mm · 18 of 172 slices shown]
[im 1/172]
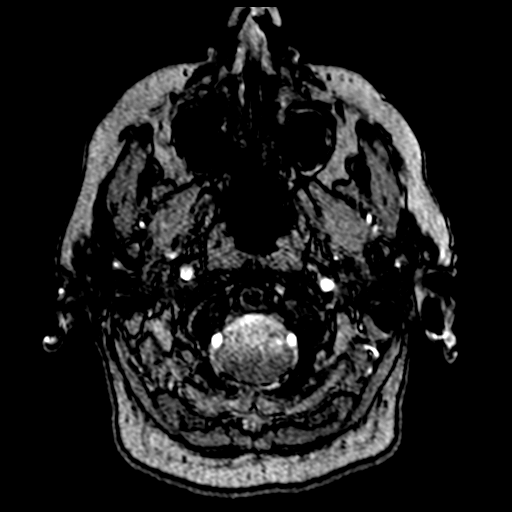
[im 4/172]
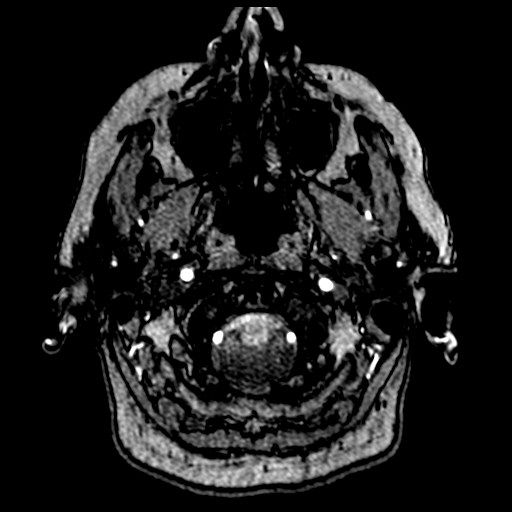
[im 8/172]
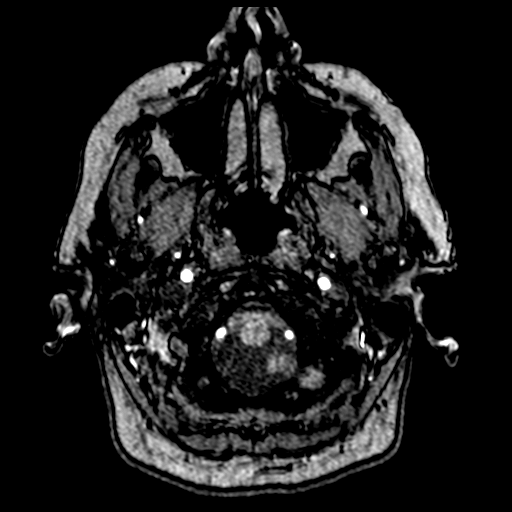
[im 11/172]
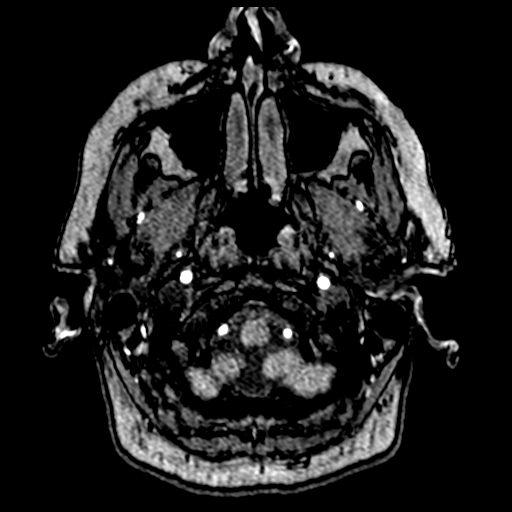
[im 15/172]
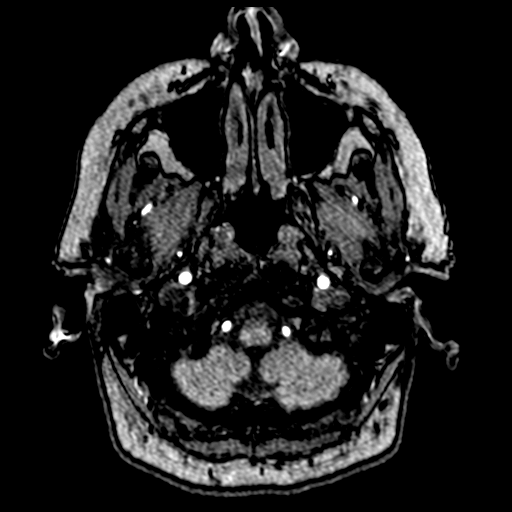
[im 19/172]
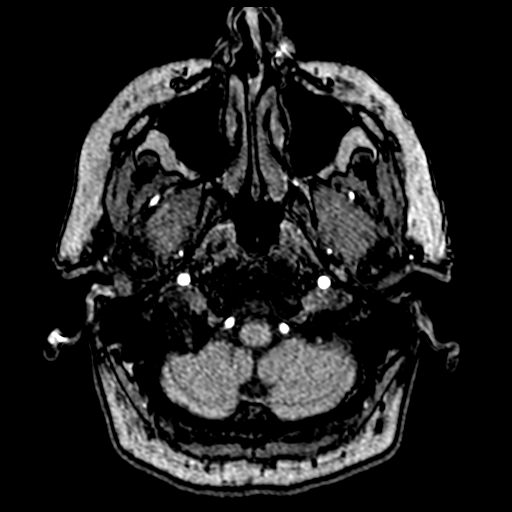
[im 22/172]
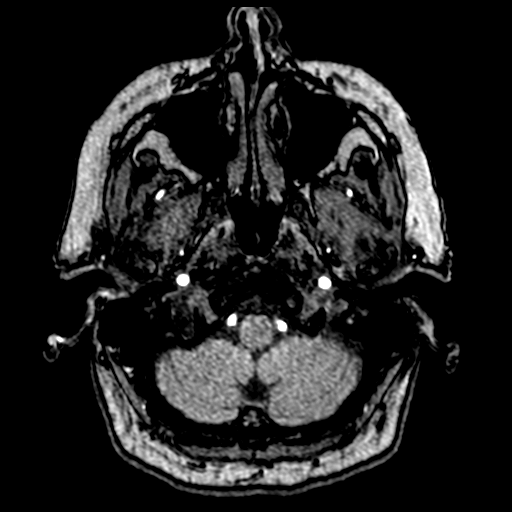
[im 26/172]
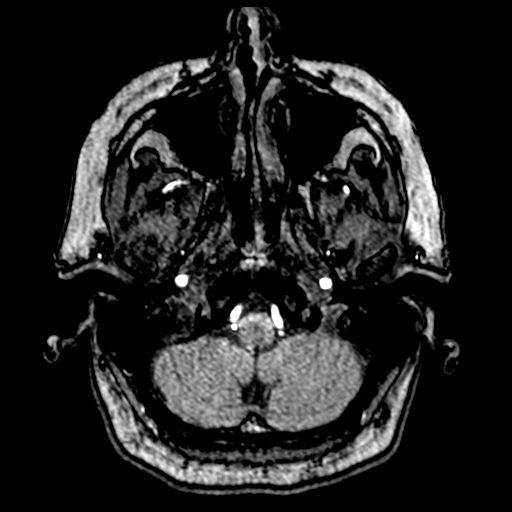
[im 30/172]
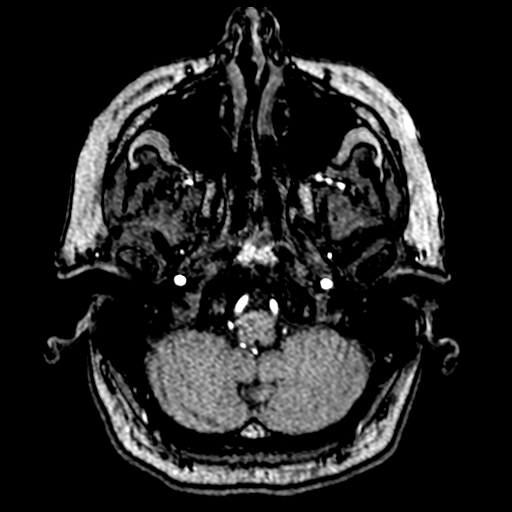
[im 33/172]
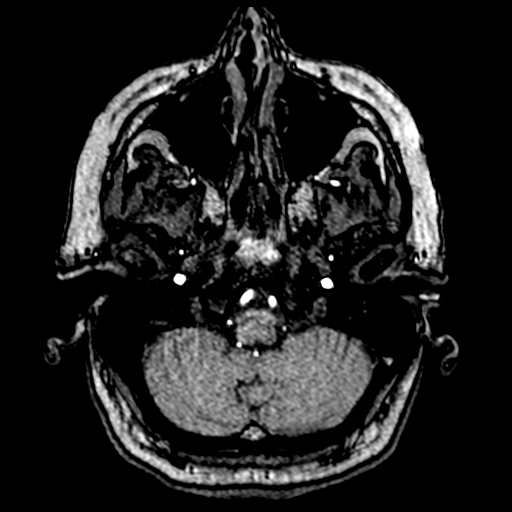
[im 55/172]
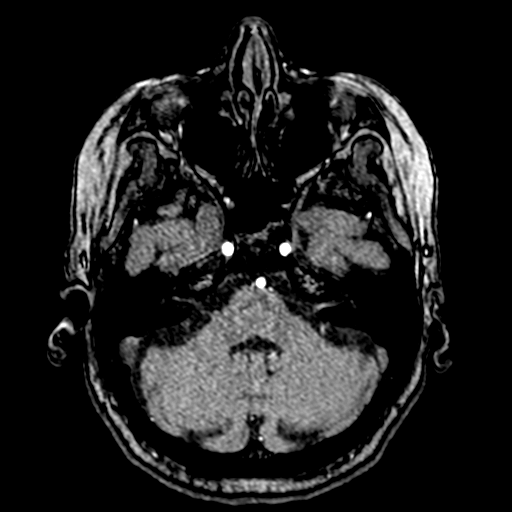
[im 77/172]
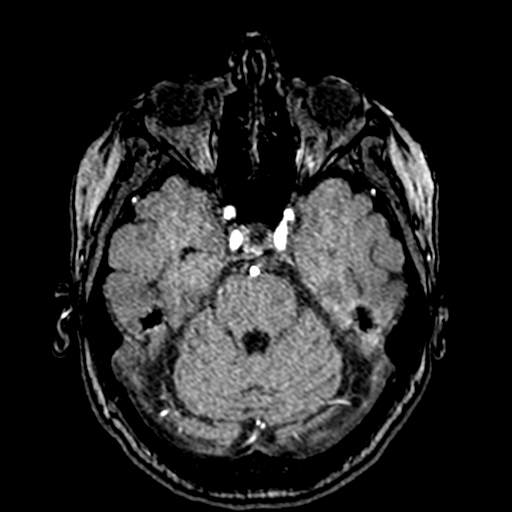
[im 88/172]
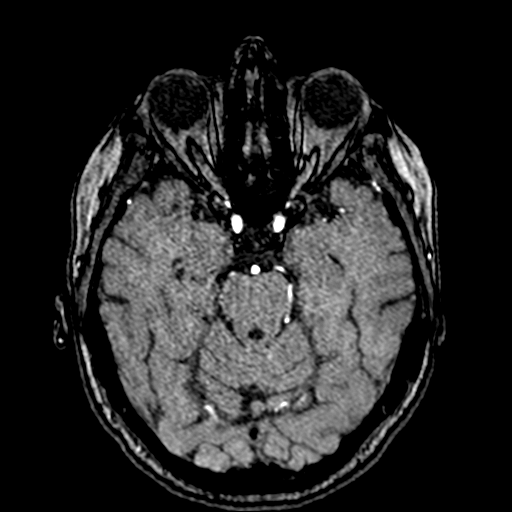
[im 99/172]
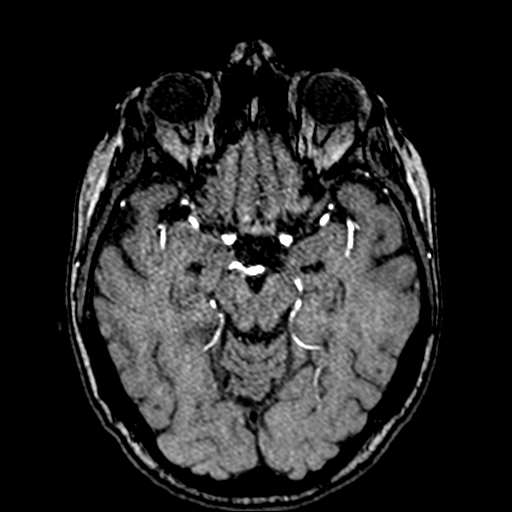
[im 121/172]
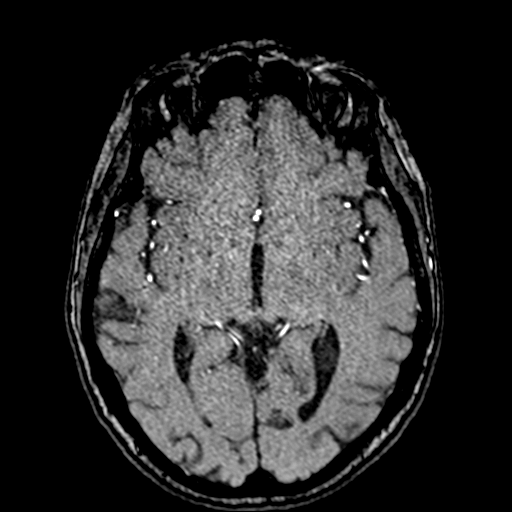
[im 142/172]
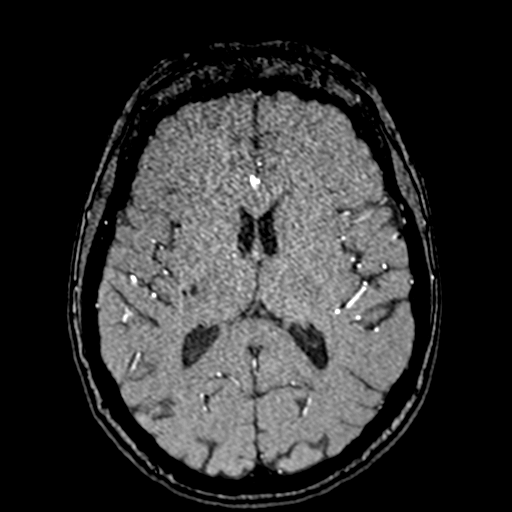
[im 146/172]
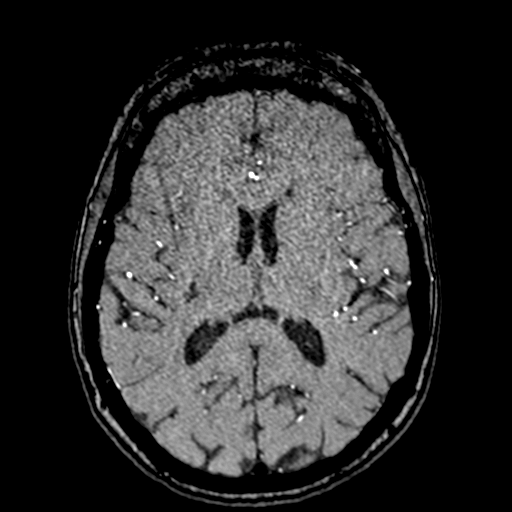
[im 164/172]
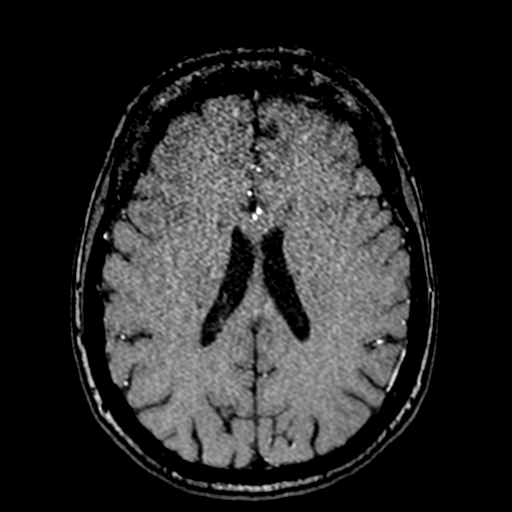

[18 of 48 positions shown; findings below may reference images not displayed]

FINDINGS: MR HEAD FINDINGS

Brain: No acute infarct, mass effect or extra-axial collection. No
acute or chronic hemorrhage. There is multifocal hyperintense
T2-weighted signal within the white matter. Parenchymal volume and
CSF spaces are normal. The midline structures are normal.

Vascular: Major flow voids are preserved.

Skull and upper cervical spine: Normal calvarium and skull base.
Visualized upper cervical spine and soft tissues are normal.

Sinuses/Orbits:No paranasal sinus fluid levels or advanced mucosal
thickening. No mastoid or middle ear effusion. Normal orbits.

MRA HEAD FINDINGS

POSTERIOR CIRCULATION:

--Vertebral arteries: Normal

--Inferior cerebellar arteries: Normal.

--Basilar artery: Normal.

--Superior cerebellar arteries: Normal.

--Posterior cerebral arteries: Normal.

ANTERIOR CIRCULATION:

--Intracranial internal carotid arteries: Normal.

--Anterior cerebral arteries (ACA): Normal.

--Middle cerebral arteries (MCA): Normal.

ANATOMIC VARIANTS: None

MRA NECK FINDINGS

Aortic arch: Standard branching.  No abnormality.

Right carotid system: There is mild narrowing of the proximal right
internal carotid artery with less than 50% stenosis by NASCET
criteria.

Left carotid system: There is approximately 50% stenosis of the
proximal left internal carotid artery.

Vertebral arteries: Normal

Other: None.
IMPRESSION: 1. No acute intracranial abnormality.
2. Normal intracranial MRA.
3. Approximately 50% stenosis of the proximal left internal carotid
artery and mild (less than 50%) narrowing of the proximal right
internal carotid artery.

## 2021-07-05 IMAGING — MR MR MRA NECK WO/W CM
9 of 10 series · 42 of 48 positions shown · IV contrast (Contrast agent)
Comparison: No pertinent prior exam.

CLINICAL DATA: Monocular vision loss

EXAM:
MRI HEAD WITHOUT AND WITH CONTRAST
MRA HEAD WITHOUT CONTRAST
MRA OF THE NECK WITHOUT AND WITH CONTRAST
TECHNIQUE: Multiplanar, multi-echo pulse sequences of the brain and surrounding
structures were acquired without and with intravenous contrast.
Angiographic images of the Circle of Willis were acquired using MRA
technique without intravenous contrast. Angiographic images of the
neck were acquired using MRA technique without and with intravenous
contrast. Carotid stenosis measurements (when applicable) are
obtained utilizing NASCET criteria, using the distal internal
carotid diameter as the denominator.
CONTRAST:  10mL GADAVIST GADOBUTROL 1 MMOL/ML IV SOLN

[Series 22: tof_fl3d_tra_iso · axial · 0.6mm · 0.52mm/px · z∈[-162,-83]mm · 9 of 133 slices shown]
[im 1/133]
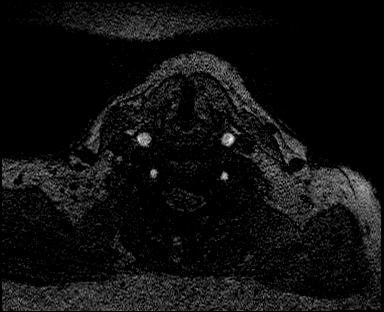
[im 17/133]
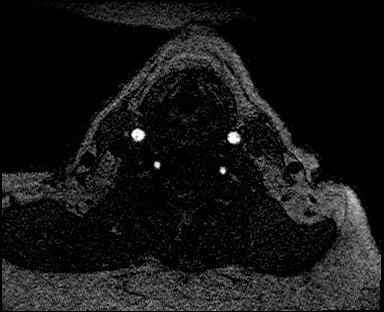
[im 34/133]
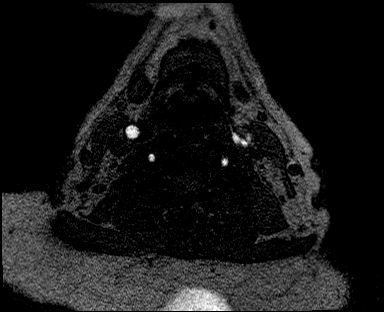
[im 50/133]
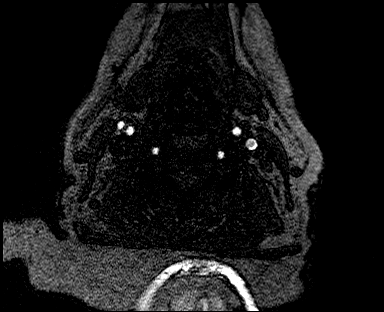
[im 67/133]
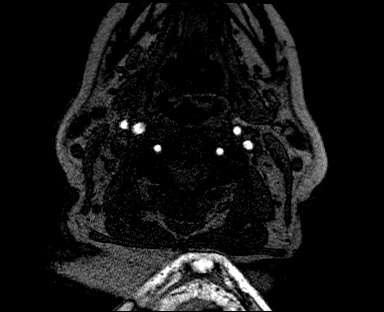
[im 83/133]
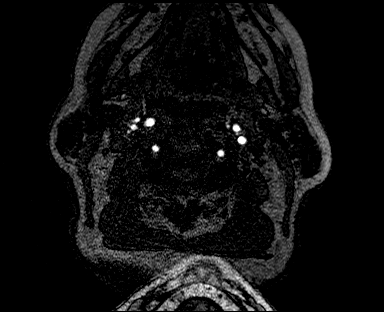
[im 100/133]
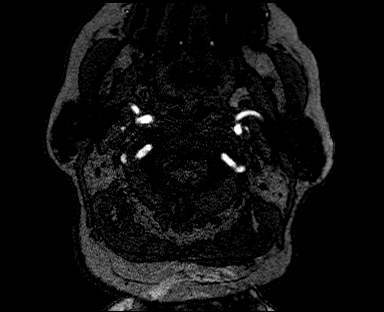
[im 116/133]
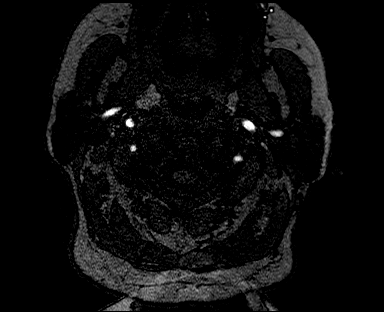
[im 133/133]
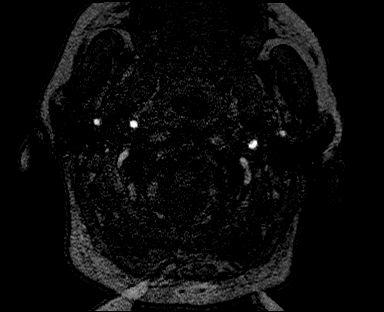

[Series 25: angio_fl3d_cor_pre_ttc=3.0s · coronal · 0.9mm · 0.85mm/px · 5 of 80 slices shown]
[im 1/80]
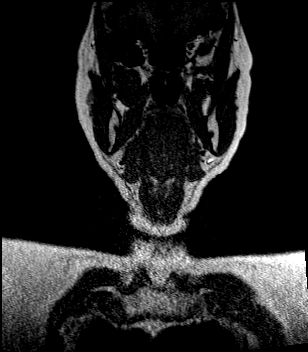
[im 20/80]
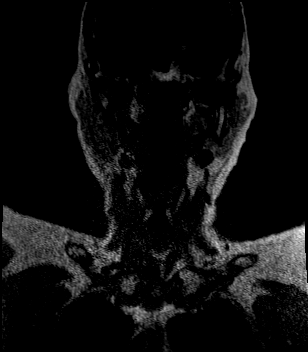
[im 40/80]
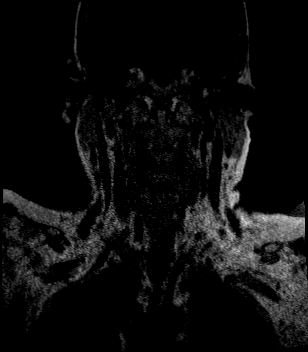
[im 60/80]
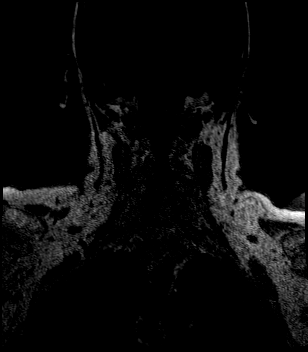
[im 80/80]
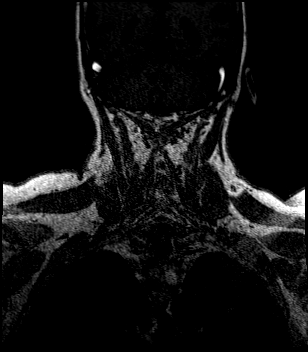

[Series 27: angio_fl3d_cor_post_ttc=3.0s · coronal · 0.9mm · 0.85mm/px · 5 of 80 slices shown (1 of 2)]
[im 1/80]
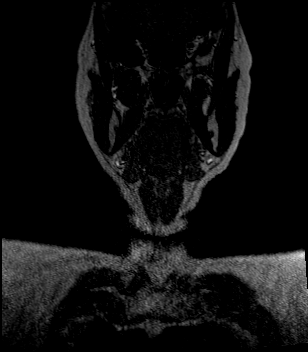
[im 20/80]
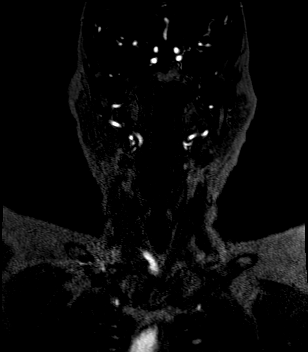
[im 40/80]
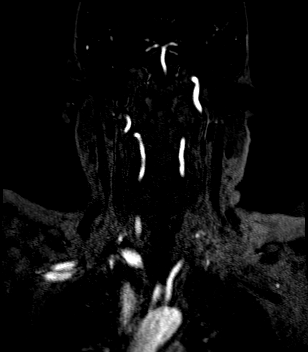
[im 60/80]
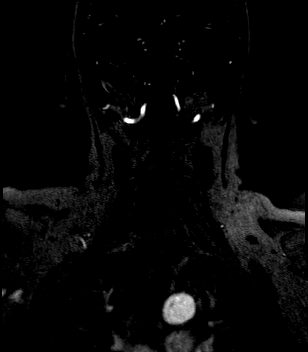
[im 80/80]
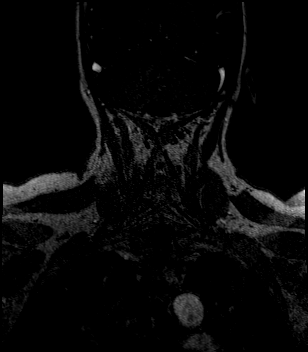

[Series 28: angio_fl3d_cor_post_ttc=3.0s_moco-adv · coronal · 0.9mm · 0.85mm/px · 5 of 80 slices shown (1 of 2)]
[im 1/80]
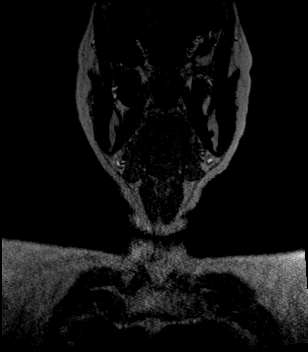
[im 20/80]
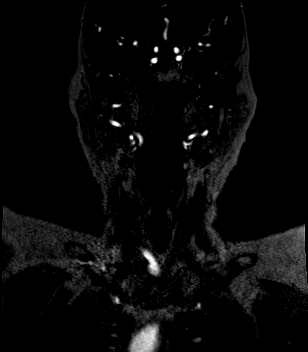
[im 40/80]
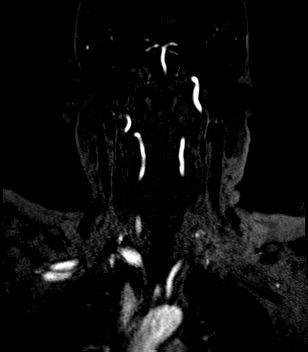
[im 60/80]
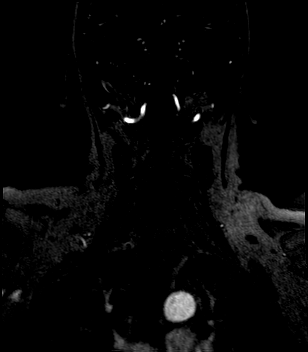
[im 80/80]
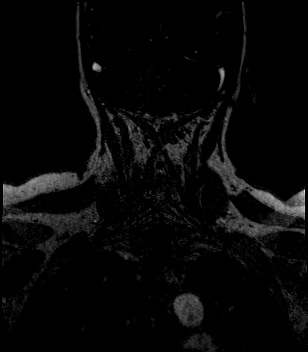

[Series 29: angio_fl3d_cor_post_ttc=3.0s_moco-adv_sub · coronal · 0.9mm · 0.85mm/px · 5 of 80 slices shown (1 of 2)]
[im 1/80]
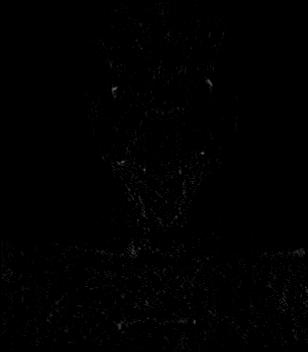
[im 20/80]
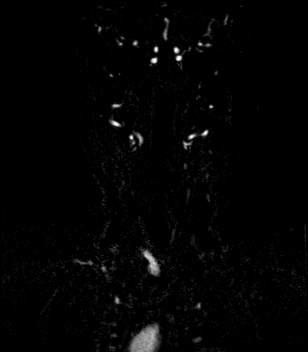
[im 40/80]
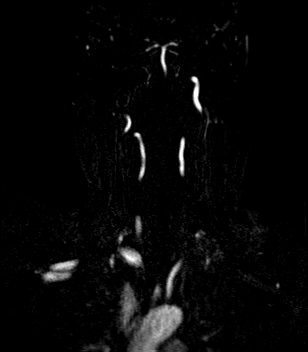
[im 60/80]
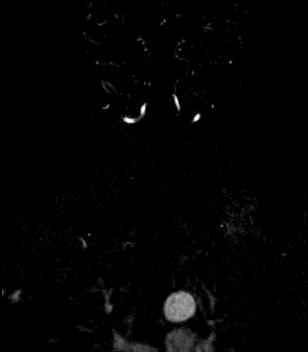
[im 80/80]
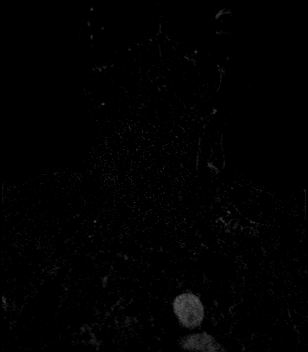

[Series 31: angio_fl3d_cor_post_ttc=3.0s · coronal · 0.9mm · 0.85mm/px · 5 of 80 slices shown (2 of 2)]
[im 1/80]
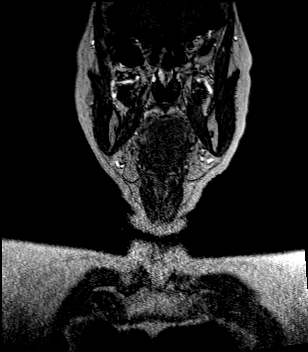
[im 20/80]
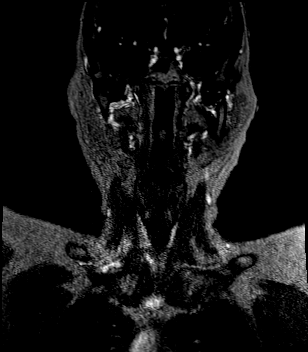
[im 40/80]
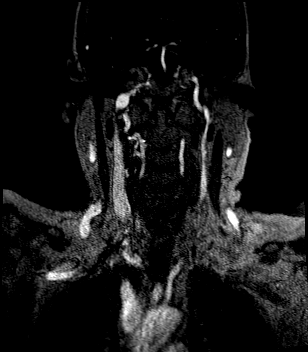
[im 60/80]
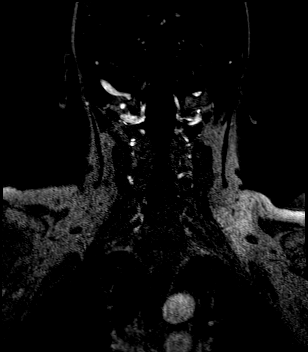
[im 80/80]
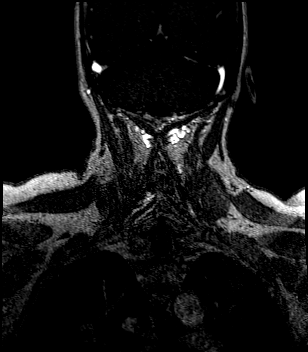

[Series 32: angio_fl3d_cor_post_ttc=3.0s_moco-adv · coronal · 0.9mm · 0.85mm/px · 5 of 80 slices shown (2 of 2)]
[im 1/80]
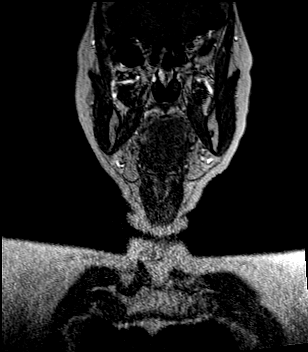
[im 20/80]
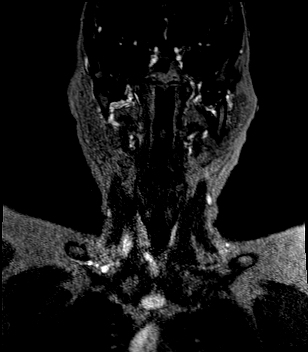
[im 40/80]
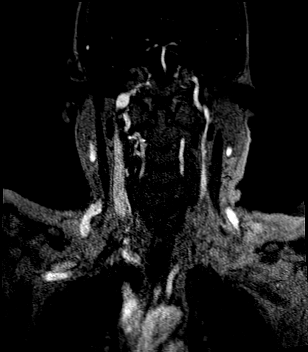
[im 60/80]
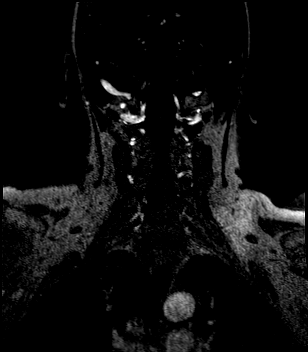
[im 80/80]
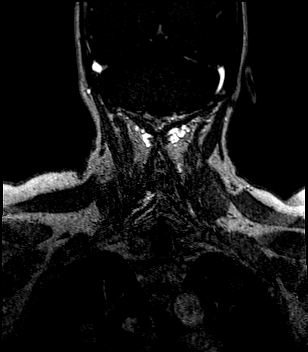

[Series 33: angio_fl3d_cor_post_ttc=3.0s_moco-adv_sub · coronal · 0.9mm · 0.85mm/px · 1 of 78 slices shown (2 of 2)]
[im 1/78]
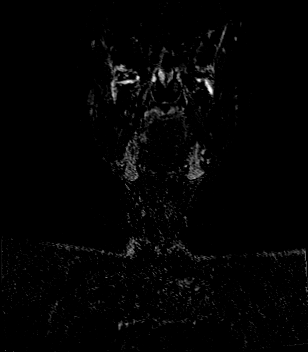

[Series 35: T2 post-contrast · coronal · 5.0mm · 0.72mm/px · 2 of 28 slices shown]
[im 1/28]
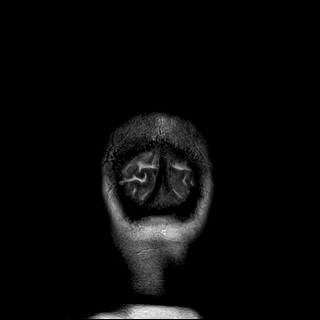
[im 28/28]
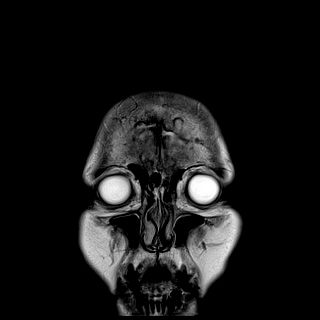

[42 of 48 positions shown; findings below may reference images not displayed]

FINDINGS: MR HEAD FINDINGS

Brain: No acute infarct, mass effect or extra-axial collection. No
acute or chronic hemorrhage. There is multifocal hyperintense
T2-weighted signal within the white matter. Parenchymal volume and
CSF spaces are normal. The midline structures are normal.

Vascular: Major flow voids are preserved.

Skull and upper cervical spine: Normal calvarium and skull base.
Visualized upper cervical spine and soft tissues are normal.

Sinuses/Orbits:No paranasal sinus fluid levels or advanced mucosal
thickening. No mastoid or middle ear effusion. Normal orbits.

MRA HEAD FINDINGS

POSTERIOR CIRCULATION:

--Vertebral arteries: Normal

--Inferior cerebellar arteries: Normal.

--Basilar artery: Normal.

--Superior cerebellar arteries: Normal.

--Posterior cerebral arteries: Normal.

ANTERIOR CIRCULATION:

--Intracranial internal carotid arteries: Normal.

--Anterior cerebral arteries (ACA): Normal.

--Middle cerebral arteries (MCA): Normal.

ANATOMIC VARIANTS: None

MRA NECK FINDINGS

Aortic arch: Standard branching.  No abnormality.

Right carotid system: There is mild narrowing of the proximal right
internal carotid artery with less than 50% stenosis by NASCET
criteria.

Left carotid system: There is approximately 50% stenosis of the
proximal left internal carotid artery.

Vertebral arteries: Normal

Other: None.
IMPRESSION: 1. No acute intracranial abnormality.
2. Normal intracranial MRA.
3. Approximately 50% stenosis of the proximal left internal carotid
artery and mild (less than 50%) narrowing of the proximal right
internal carotid artery.

## 2021-07-05 IMAGING — MR MR HEAD WO/W CM
14 of 16 series · 41 of 48 positions shown · IV contrast (Contrast agent)
Comparison: No pertinent prior exam.

CLINICAL DATA: Monocular vision loss

EXAM:
MRI HEAD WITHOUT AND WITH CONTRAST
MRA HEAD WITHOUT CONTRAST
MRA OF THE NECK WITHOUT AND WITH CONTRAST
TECHNIQUE: Multiplanar, multi-echo pulse sequences of the brain and surrounding
structures were acquired without and with intravenous contrast.
Angiographic images of the Circle of Willis were acquired using MRA
technique without intravenous contrast. Angiographic images of the
neck were acquired using MRA technique without and with intravenous
contrast. Carotid stenosis measurements (when applicable) are
obtained utilizing NASCET criteria, using the distal internal
carotid diameter as the denominator.
CONTRAST:  10mL GADAVIST GADOBUTROL 1 MMOL/ML IV SOLN

[Series 5: DWI · axial · 3.0mm · 0.88mm/px · z∈[-66,+82]mm · 6 of 102 slices shown (1 of 4)]
[im 1/102]
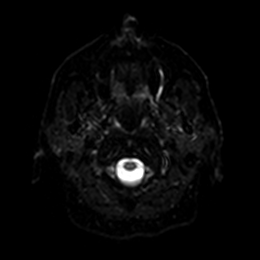
[im 21/102]
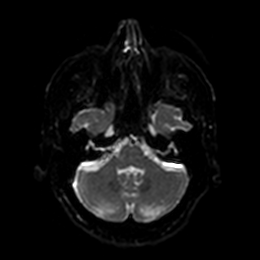
[im 41/102]
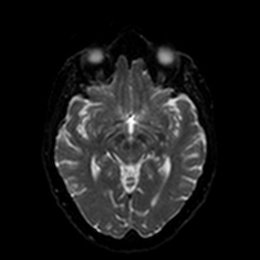
[im 61/102]
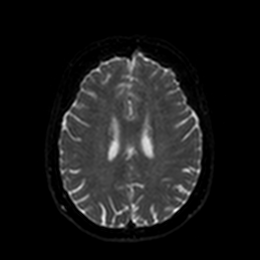
[im 81/102]
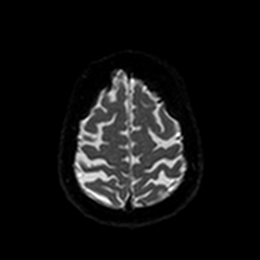
[im 102/102]
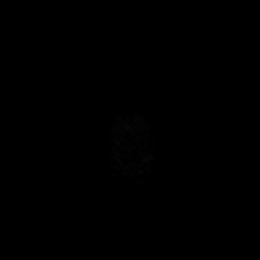

[Series 6: DWI · axial · 3.0mm · 0.88mm/px · z∈[-66,+82]mm · 2 of 51 slices shown (2 of 4)]
[im 1/51]
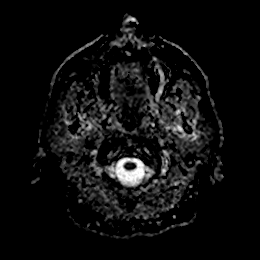
[im 51/51]
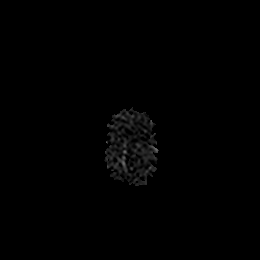

[Series 7: DWI · coronal · 4.0mm · 0.88mm/px · 4 of 68 slices shown (3 of 4)]
[im 1/68]
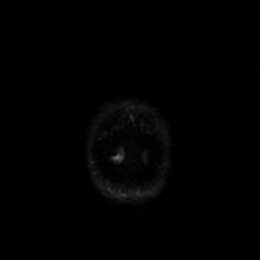
[im 23/68]
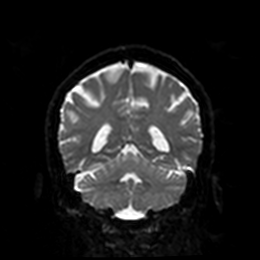
[im 45/68]
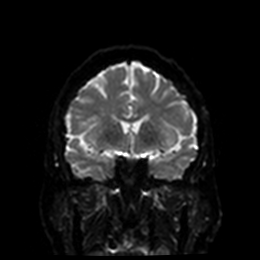
[im 68/68]
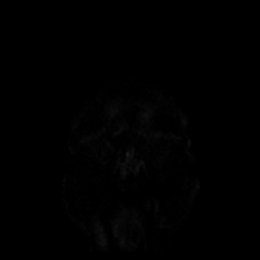

[Series 8: DWI · coronal · 4.0mm · 0.88mm/px · 2 of 34 slices shown (4 of 4)]
[im 1/34]
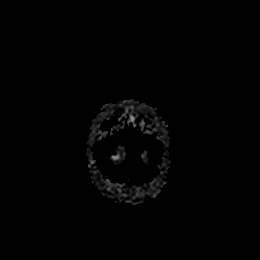
[im 34/34]
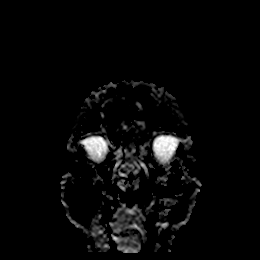

[Series 9: T1 · sagittal · 5.0mm · 0.75mm/px · 2 of 24 slices shown]
[im 1/24]
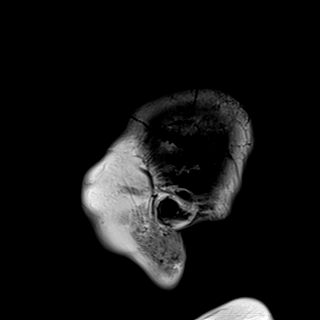
[im 24/24]
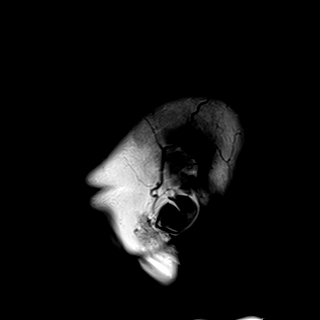

[Series 10: T2 · axial · 5.0mm · 0.72mm/px · z∈[-66,+82]mm · 2 of 26 slices shown]
[im 1/26]
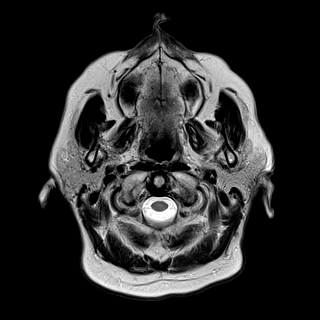
[im 26/26]
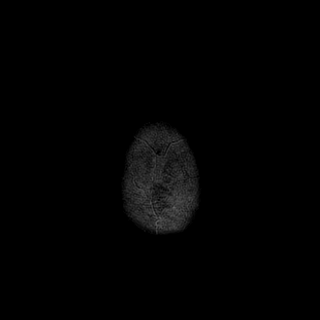

[Series 11: FLAIR · axial · 5.0mm · 0.45mm/px · z∈[-65,+83]mm · 2 of 26 slices shown]
[im 1/26]
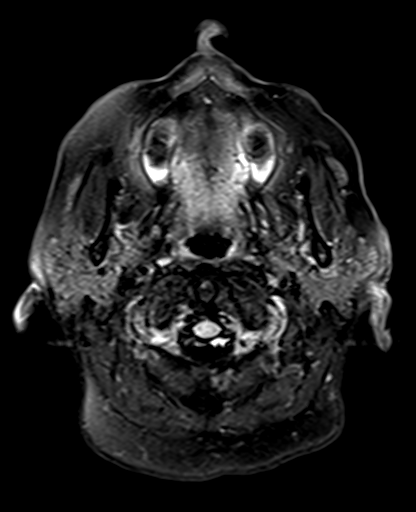
[im 26/26]
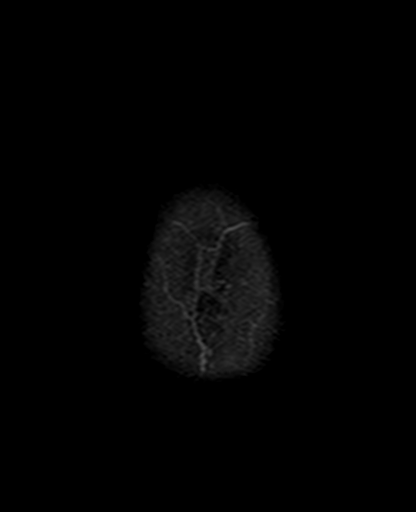

[Series 12: mag_images · axial · 3.0mm · 0.90mm/px · z∈[-72,+91]mm · 4 of 56 slices shown]
[im 1/56]
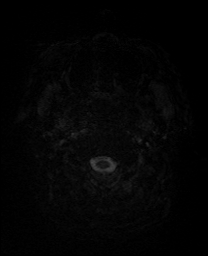
[im 19/56]
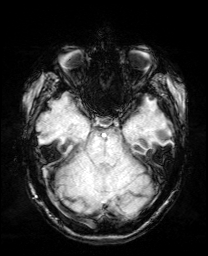
[im 37/56]
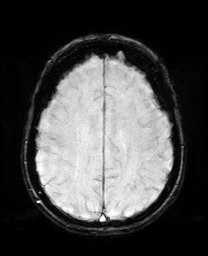
[im 56/56]
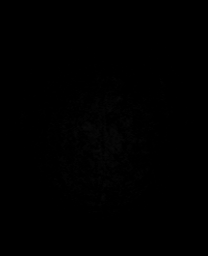

[Series 13: pha_images · axial · 3.0mm · 0.90mm/px · z∈[-72,+91]mm · 4 of 55 slices shown]
[im 1/55]
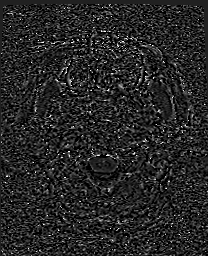
[im 19/55]
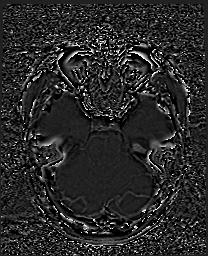
[im 37/55]
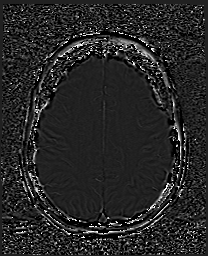
[im 55/55]
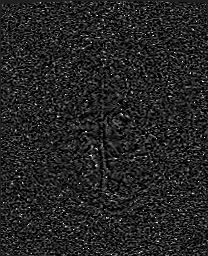

[Series 14: swi_images · axial · 3.0mm · 0.90mm/px · z∈[-72,+91]mm · 4 of 56 slices shown]
[im 1/56]
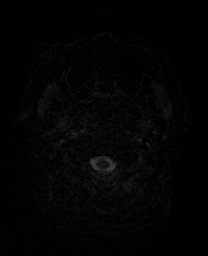
[im 19/56]
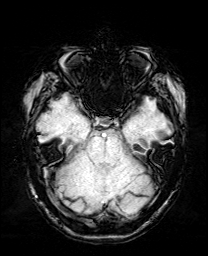
[im 37/56]
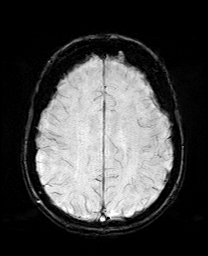
[im 56/56]
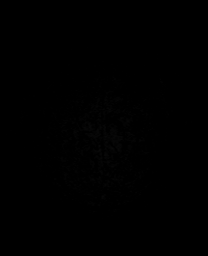

[Series 15: mip_images(sw) · axial · 24.0mm · 0.90mm/px · z∈[-62,+80]mm · 3 of 49 slices shown]
[im 1/49]
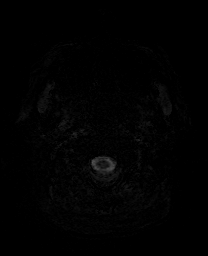
[im 25/49]
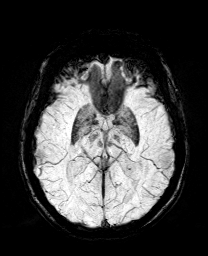
[im 49/49]
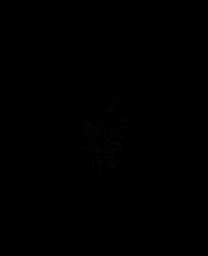

[Series 17: T2 post-contrast · coronal · 5.0mm · 0.72mm/px · 2 of 28 slices shown]
[im 1/28]
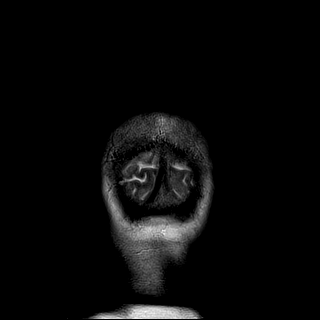
[im 28/28]
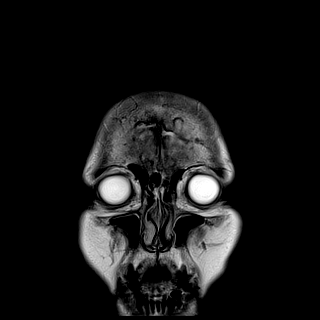

[Series 19: T1 post-contrast · coronal · 5.0mm · 0.34mm/px · 2 of 28 slices shown (1 of 2)]
[im 1/28]
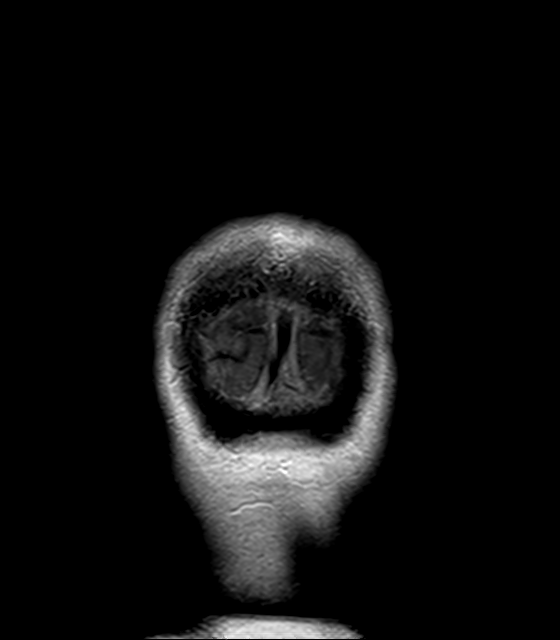
[im 28/28]
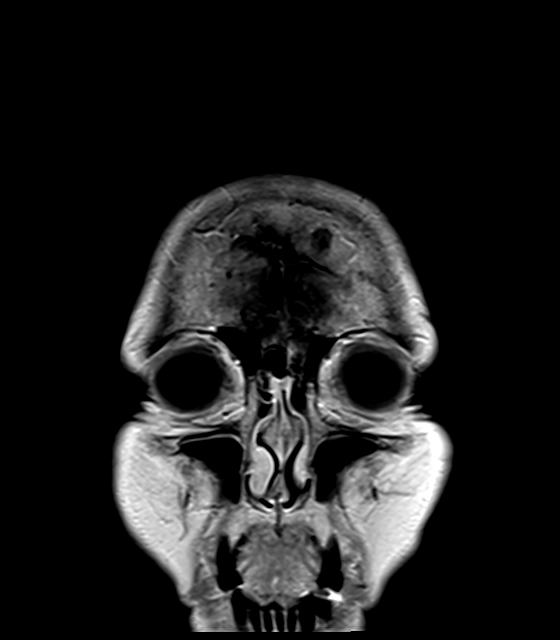

[Series 20: T1 post-contrast · sagittal · 5.0mm · 0.72mm/px · 2 of 24 slices shown (2 of 2)]
[im 1/24]
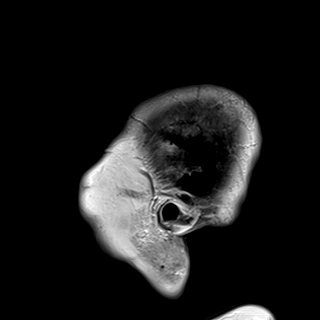
[im 24/24]
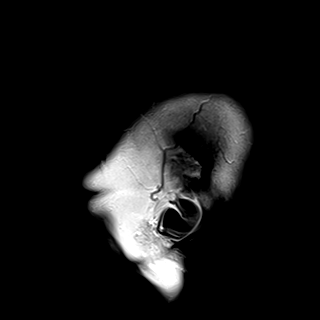

[41 of 48 positions shown; findings below may reference images not displayed]

FINDINGS: MR HEAD FINDINGS

Brain: No acute infarct, mass effect or extra-axial collection. No
acute or chronic hemorrhage. There is multifocal hyperintense
T2-weighted signal within the white matter. Parenchymal volume and
CSF spaces are normal. The midline structures are normal.

Vascular: Major flow voids are preserved.

Skull and upper cervical spine: Normal calvarium and skull base.
Visualized upper cervical spine and soft tissues are normal.

Sinuses/Orbits:No paranasal sinus fluid levels or advanced mucosal
thickening. No mastoid or middle ear effusion. Normal orbits.

MRA HEAD FINDINGS

POSTERIOR CIRCULATION:

--Vertebral arteries: Normal

--Inferior cerebellar arteries: Normal.

--Basilar artery: Normal.

--Superior cerebellar arteries: Normal.

--Posterior cerebral arteries: Normal.

ANTERIOR CIRCULATION:

--Intracranial internal carotid arteries: Normal.

--Anterior cerebral arteries (ACA): Normal.

--Middle cerebral arteries (MCA): Normal.

ANATOMIC VARIANTS: None

MRA NECK FINDINGS

Aortic arch: Standard branching.  No abnormality.

Right carotid system: There is mild narrowing of the proximal right
internal carotid artery with less than 50% stenosis by NASCET
criteria.

Left carotid system: There is approximately 50% stenosis of the
proximal left internal carotid artery.

Vertebral arteries: Normal

Other: None.
IMPRESSION: 1. No acute intracranial abnormality.
2. Normal intracranial MRA.
3. Approximately 50% stenosis of the proximal left internal carotid
artery and mild (less than 50%) narrowing of the proximal right
internal carotid artery.

## 2021-07-05 MED ORDER — LORAZEPAM 2 MG/ML IJ SOLN
1.0000 mg | Freq: Once | INTRAMUSCULAR | Status: AC
  Administered 2021-07-05: 19:00:00 1 mg via INTRAVENOUS
  Filled 2021-07-05: qty 1

## 2021-07-05 MED ORDER — GADOBUTROL 1 MMOL/ML IV SOLN
10.0000 mL | Freq: Once | INTRAVENOUS | Status: AC | PRN
  Administered 2021-07-05: 20:00:00 10 mL via INTRAVENOUS

## 2021-07-05 NOTE — ED Provider Notes (Addendum)
[facility] EMERGENCY DEPARTMENT Provider Note   CSN: [id number] Arrival date & time: 07/05/21  1543     History Chief Complaint  Patient presents with   Hypertension    With vision changes    Meghan Diaz is a 80 y.o. female.  Patient referred in by her ophthalmologist [facility] ophthalmology.  Patient with blurred vision out of the right eye and a mild headache starting yesterday for sure may be the day before.  Ophthalmologist could not find any ocular cause for the visual changes and was concerned about stroke as the cause.  He did dilate her right eye which is still dilated.  Patient about 3 to 4 days ago had some headache and runny nose and some congestion.  Did not think much of it other than mild cold.  Past medical history is significant obesity hypothyroidism hypertension hyperlipidemia decreased hearing and a history of floaters in the visual fields.  Patient denies any speech problems any weakness or numbness in the extremities.      Past Medical History:  Diagnosis Date   Decreased hearing    Depression    Floaters in visual field    HTN (hypertension)    Hyperlipidemia    Hypothyroidism    Obesity     Patient Active Problem List   Diagnosis Date Noted   Minimal depression 03/08/2020   Class 1 obesity with serious comorbidity and body mass index (BMI) of 33.0 to 33.9 in adult 04/17/2018   HTN (hypertension), benign 03/25/2018   Hypothyroid 03/25/2018   Neck pain on left side 07/20/2015    Past Surgical History:  Procedure Laterality Date   ANKLE SURGERY Left      OB History     Gravida  2   Para  2   Term  2   Preterm      AB      Living  1      SAB      IAB      Ectopic      Multiple      Live Births              Family History  Problem Relation Age of Onset   Diabetes Mother    Hypertension Mother    Kidney disease Mother    Heart disease Mother    Cancer Father     Social History   Tobacco  Use   Smoking status: Never   Smokeless tobacco: Never    Home Medications Prior to Admission medications   Medication Sig Start Date End Date Taking? Authorizing Provider  ALPRAZolam Duanne Moron) 0.5 MG tablet Take 0.5 mg by mouth at bedtime as needed for anxiety.    [provider]  levothyroxine (SYNTHROID) 75 MCG tablet Take 1 tablet (75 mcg total) by mouth daily before breakfast. 08/09/20   Georgia Lopes, DO  losartan-hydrochlorothiazide (HYZAAR) 50-12.5 MG tablet Take 1 tablet by mouth daily.    [provider]  Vitamin D, Ergocalciferol, (DRISDOL) 1.25 MG (50000 UNIT) CAPS capsule Take 1 capsule (50,000 Units total) by mouth every 7 (seven) days. 06/08/21   Dennard Nip D, MD    Allergies    Codeine, Molds & smuts, and Penicillins  Review of Systems   Review of Systems  Constitutional:  Negative for chills and fever.  HENT:  Negative for ear pain and sore throat.   Eyes:  Positive for visual disturbance. Negative for pain.  Respiratory:  Negative for  cough and shortness of breath.   Cardiovascular:  Negative for chest pain and palpitations.  Gastrointestinal:  Negative for abdominal pain and vomiting.  Genitourinary:  Negative for dysuria and hematuria.  Musculoskeletal:  Negative for arthralgias and back pain.  Skin:  Negative for color change and rash.  Neurological:  Negative for seizures and syncope.  All other systems reviewed and are negative.  Physical Exam Updated Vital Signs BP (!) 160/108    Pulse 96    Temp (!) 97.3 F (36.3 C) (Oral)    Resp 18    SpO2 98%   Physical Exam Vitals and nursing note reviewed.  Constitutional:      General: She is not in acute distress.    Appearance: Normal appearance. She is well-developed.  HENT:     Head: Normocephalic and atraumatic.  Eyes:     Conjunctiva/sclera: Conjunctivae normal.     Comments: Right pupil is dilated.  Blurred vision out of the right eye  Cardiovascular:     Rate and Rhythm: Normal  rate and regular rhythm.     Heart sounds: No murmur heard. Pulmonary:     Effort: Pulmonary effort is normal. No respiratory distress.     Breath sounds: Normal breath sounds. No wheezing, rhonchi or rales.  Abdominal:     Palpations: Abdomen is soft.     Tenderness: There is no abdominal tenderness.  Musculoskeletal:        General: No swelling.     Cervical back: Neck supple.  Skin:    General: Skin is warm and dry.     Capillary Refill: Capillary refill takes less than 2 seconds.  Neurological:     Mental Status: She is alert.     Cranial Nerves: Cranial nerve deficit present.     Sensory: No sensory deficit.     Motor: No weakness.  Psychiatric:        Mood and Affect: Mood normal.    ED Results / Procedures / Treatments   Labs (all labs ordered are listed, but only abnormal results are displayed) Labs Reviewed  RESP PANEL BY RT-PCR (FLU A&B, COVID) ARPGX2 - Abnormal; Notable for the following components:      Result Value   SARS Coronavirus 2 by RT PCR POSITIVE (*)    All other components within normal limits  URINALYSIS, ROUTINE W REFLEX MICROSCOPIC - Abnormal; Notable for the following components:   Leukocytes,Ua TRACE (*)    Bacteria, UA RARE (*)    All other components within normal limits  I-STAT CHEM 8, ED - Abnormal; Notable for the following components:   Hemoglobin 16.0 (*)    HCT 47.0 (*)    All other components within normal limits  ETHANOL  PROTIME-INR  APTT  CBC  DIFFERENTIAL  COMPREHENSIVE METABOLIC PANEL  RAPID URINE DRUG SCREEN, HOSP PERFORMED    EKG EKG Interpretation  Date/Time:  Wednesday July 05 2021 15:43:24 EST Ventricular Rate:  102 PR Interval:  170 QRS Duration: 96 QT Interval:  362 QTC Calculation: 471 R Axis:   -43 Text Interpretation: Sinus tachycardia with frequent Premature ventricular complexes Left axis deviation Moderate voltage criteria for LVH, may be normal variant ( R in aVL , Cornell product ) Abnormal ECG No  previous ECGs available Confirmed by Vanetta Mulders [phone]) on 07/05/2021 6:10:59 PM  Radiology CT HEAD WO CONTRAST  Result Date: 07/05/2021 CLINICAL DATA:  Vision loss. EXAM: CT HEAD WITHOUT CONTRAST TECHNIQUE: Contiguous axial images were obtained from the base of  the skull through the vertex without intravenous contrast. COMPARISON:  None. FINDINGS: Brain: No evidence of acute infarction, hemorrhage, hydrocephalus, extra-axial collection or mass lesion/mass effect. Vascular: Vascular calcifications but no aneurysm or hyperdense vessels. Skull: No skull fracture or bone lesions. Moderate hyperostosis frontalis interna. Sinuses/Orbits: The paranasal sinuses and mastoid air cells are clear. The globes are intact. Other: No scalp lesions or scalp hematoma. IMPRESSION: No acute intracranial findings or mass lesions. Electronically Signed   By: Marijo Sanes M.D.   On: 07/05/2021 17:58    Procedures Procedures   Medications Ordered in ED Medications  LORazepam (ATIVAN) injection 1 mg (has no administration in time range)    ED Course  I have reviewed the triage vital signs and the nursing notes.  Pertinent labs & imaging results that were available during my care of the patient were reviewed by me and considered in my medical decision making (see chart for details).  Clinical Course as of 07/05/21 1835  Wed Jul 05, 2021  1607 Case discussed with radiologist Dr. Posey Pronto who recommends MRI of the brain and MRA of the head and neck without contrast for further evaluation of the patient's presenting concern.  I appreciate the collaboration of care with patient. [RS]    Clinical Course User Index [RS] Sponseller, Gypsy Balsam, PA-C   MDM Rules/Calculators/A&P                          CRITICAL CARE Performed by: Fredia Sorrow Total critical care time: 45 minutes Critical care time was exclusive of separately billable procedures and treating other patients. Critical care was necessary to  treat or prevent imminent or life-threatening deterioration. Critical care was time spent personally by me on the following activities: development of treatment plan with patient and/or surrogate as well as nursing, discussions with consultants, evaluation of patient's response to treatment, examination of patient, obtaining history from patient or surrogate, ordering and performing treatments and interventions, ordering and review of laboratory studies, ordering and review of radiographic studies, pulse oximetry and re-evaluation of patient's condition.  Probable CVA responsible for the blurred vision.  Patient also COVID-positive.  But oxygen saturations are good.  Will get chest x-ray.  Patient without any other focal neurodeficits.  Patient will receive MRIs.  Patient needs Ativan to get through the MRI.  It is ordered.  Final Clinical Impression(s) / ED Diagnoses Final diagnoses:  Primary hypertension  COVID  Visual changes    Rx / DC Orders ED Discharge Orders     None        Fredia Sorrow, MD 07/05/21 1835   Addendum: Wound prescription paper that was written by patient's ophthalmologist Dr. Josephina Shih from [facility] ophthalmology.  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  Stroke work-up here without acute findings.  But there was abnormalities in the carotid arteries.  Will discuss with vascular surgery.  Already had Dr. Cheral Marker from neurology review the MR eyes.  And no evidence of any central abnormality or any evidence of inflammation of the optic nerve.  But based on this finding we will see if vascular surgery wants admission by hospitalist for carotid ultrasound or whether they want to do this in the office.  We will consult vascular surgery.  Vascular surgery recommends outpatient work-up starting Plavix and a baby aspirin a day.  We will also have patient contact her ophthalmologist to  make sure we got everything covered.  Also went over it again with Dr. Cheral Marker from neurology.  Also did try to call Dr. Josephina Shih.  But it went to voicemail.    Fredia Sorrow, MD 07/05/21 2349    Fredia Sorrow, MD 07/06/21 [id number]    Fredia Sorrow, MD 07/06/21 0021

## 2021-07-05 NOTE — ED Provider Notes (Signed)
Emergency Medicine Provider Triage Evaluation Note  Meghan Diaz , a 80 y.o. female  was evaluated in triage.  Pt who presents on referral from ophthalmology for stroke work-up.  Patient has had 3 days of monocular vision loss in the right eye with associated challenge with her balance with ambulation.  Denies any other neurologic symptoms.  Was seen by her ophthalmologist, Dr. 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  Review of Systems  Positive: Monocular vision loss in the right eye Negative: Slurred speech, confusion, weakness  Physical Exam  There were no vitals taken for this visit. Gen:   Awake, no distress   Resp:  Normal effort  MSK:   Moves extremities without difficulty  Other:  Left pupil is round and reactive to light, right pupil is fixed and dilated.  EOMI.  Cranial nerves are intact with the exception of the above ocular findings.  Sensation and strength are symmetric in the upper and lower extremities bilaterally.  No pronator drift.  Medical Decision Making  Medically screening exam initiated at 3:51 PM.  Appropriate orders placed.  [name] was informed that the remainder of the evaluation will be completed by another provider, this initial triage assessment does not replace that evaluation, and the importance of remaining in the ED until their evaluation is complete.  Stroke work-up initiated, noted code stroke activated as symptoms have been going on for approximately 3 days.   Paris Lore, PA-C 07/05/21 1557    Franne Forts, DO 07/05/21 2126

## 2021-07-05 NOTE — ED Notes (Signed)
Back from CT

## 2021-07-05 NOTE — ED Notes (Signed)
Patient transported to CT 

## 2021-07-05 NOTE — ED Triage Notes (Signed)
Pt sent here by eye doctor for stroke r/o. Pt reports HTN, blurry vision in R eye x3 days. Pt's pupils are unequal, L pupil 15mm reactive, R pupil 40mm non-reactive. PT denies pain, reports feeling anxious. Pt endorses no falls recently but being off balance and needing to hold onto things when she walks.

## 2021-07-06 ENCOUNTER — Other Ambulatory Visit (HOSPITAL_COMMUNITY): Payer: Self-pay | Admitting: Vascular Surgery

## 2021-07-06 ENCOUNTER — Ambulatory Visit (HOSPITAL_COMMUNITY)
Admission: RE | Admit: 2021-07-06 | Discharge: 2021-07-06 | Disposition: A | Discharge status: Home / Self Care | Payer: Medicare HMO | Source: Ambulatory Visit | Attending: Vascular Surgery | Admitting: Vascular Surgery

## 2021-07-06 DIAGNOSIS — I779 Disorder of arteries and arterioles, unspecified: Secondary | ICD-10-CM

## 2021-07-06 MED ORDER — CLOPIDOGREL BISULFATE 75 MG PO TABS: 75.0000 mg | ORAL_TABLET | Freq: Every day | ORAL | 2 refills | Status: DC

## 2021-07-06 NOTE — ED Notes (Signed)
Pt verbalized understanding of d/c instructions, meds, and followup care. Denies questions. VSS, no distress noted. Steady gait to exit with all belongings. Assisted to wheelchair for convenience and out to lobby.

## 2021-07-06 NOTE — Discharge Instructions (Addendum)
Take the baby aspirin and Plavix as directed.  The baby aspirin will be 1 baby aspirin daily.  Call vascular surgery information provided above to set up your appointment time.  Dr. Juanetta Gosling on call for vascular surgery will said he to have their group contact you but go ahead and call them.  Also follow-up with your ophthalmologist.  To make sure no additional work-up was needed.  As you know we tried to contact him but not able to get a hold of him.

## 2021-07-11 ENCOUNTER — Encounter: Payer: Medicare HMO | Admitting: Vascular Surgery

## 2021-07-13 ENCOUNTER — Other Ambulatory Visit: Payer: Self-pay

## 2021-07-13 ENCOUNTER — Ambulatory Visit (HOSPITAL_COMMUNITY)
Admission: RE | Admit: 2021-07-13 | Discharge: 2021-07-13 | Disposition: A | Discharge status: Home / Self Care | Payer: Medicare Other | Source: Ambulatory Visit | Attending: Vascular Surgery | Admitting: Vascular Surgery

## 2021-07-13 DIAGNOSIS — I779 Disorder of arteries and arterioles, unspecified: Secondary | ICD-10-CM | POA: Diagnosis not present

## 2021-07-13 NOTE — Progress Notes (Signed)
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 (1941-05-21) female presenting at the request of .Nena Polio, NP for evaluation of possible symptomatic internal carotid artery stenosis.  Meghan Diaz initially presented to her ophthalmologist Dr. Josephina Shih [facility] [place] [facility] the end of last month with vision loss in the right eye.  On further examination, there was concern for retinal artery occlusion, and possible stroke.  She was immediately taken to the emergency department for stroke work-up.  MRI and CT scans were obtained demonstrating no appreciable areas of infarct.  MRI noted less than 50% stenosis in the right internal carotid artery, greater than 50% stenosis in the left internal carotid artery.  She was asked to follow-up with vascular surgery in the outpatient setting for further work-up.  On exam today, Meghan Diaz was accompanied by her son who lives in [place] full-time.  He has been in the area over the last several days in an effort to manage her care.  Meghan Diaz denied new symptoms concerning for TIA, stroke.  She denies history of temporal headache, temporal pain, irregular heartbeat. She continues to have vision loss in the right eye.   The pt is not on a statin for cholesterol management.  The pt is on a daily aspirin.   Other AC:  plavix The pt is on medication for hypertension.   The pt is not diabetic.  Tobacco hx:  -  Past Medical History:  Diagnosis Date   Decreased hearing    Depression    Floaters in visual field    HTN (hypertension)    Hyperlipidemia    Hypothyroidism    Obesity     Past Surgical History:  Procedure Laterality Date   ANKLE SURGERY Left     Social History   Socioeconomic History   Marital status: Divorced    Spouse name: Not on file   Number of children: Not on file    Years of education: Not on file   Highest education level: Not on file  Occupational History   Not on file  Tobacco Use   Smoking status: Never   Smokeless tobacco: Never  Substance and Sexual Activity   Alcohol use: Not on file   Drug use: Not on file   Sexual activity: Not on file  Other Topics Concern   Not on file  Social History Narrative   Not on file   Social Determinants of Health   Financial Resource Strain: Not on file  Food Insecurity: Not on file  Transportation Needs: Not on file  Physical Activity: Not on file  Stress: Not on file  Social Connections: Not on file  Intimate Partner Violence: Not on file    Family History  Problem Relation Age of Onset   Diabetes Mother    Hypertension Mother    Kidney disease Mother    Heart disease Mother    Cancer Father     Current Outpatient Medications  Medication Sig Dispense Refill   ALPRAZolam (XANAX) 0.5 MG tablet Take 0.5 mg by mouth at bedtime as needed for anxiety.     clopidogrel (PLAVIX) 75 MG tablet Take 1 tablet (75 mg total) by mouth daily. 14 tablet 2   levothyroxine (SYNTHROID) 75 MCG tablet Take 1 tablet (75 mcg total) by mouth daily before breakfast. 30 tablet 2   losartan-hydrochlorothiazide (HYZAAR) 50-12.5 MG tablet  Take 1 tablet by mouth daily.     Vitamin D, Ergocalciferol, (DRISDOL) 1.25 MG (50000 UNIT) CAPS capsule Take 1 capsule (50,000 Units total) by mouth every 7 (seven) days. 4 capsule 0   No current facility-administered medications for this visit.    Allergies  Allergen Reactions   Codeine Nausea And Vomiting   Molds & Smuts Rash   Penicillins Rash    MOLD derivative; never taken pcn before (allergic to mold)     REVIEW OF SYSTEMS:   [X]  denotes positive finding, [ ]  denotes negative finding Cardiac  Comments:  Chest pain or chest pressure:    Shortness of breath upon exertion:    Short of breath when lying flat:    Irregular heart rhythm:        Vascular    Pain in  calf, thigh, or hip brought on by ambulation:    Pain in feet at night that wakes you up from your sleep:     Blood clot in your veins:    Leg swelling:         Pulmonary    Oxygen at home:    Productive cough:     Wheezing:         Neurologic    Sudden weakness in arms or legs:     Sudden numbness in arms or legs:     Sudden onset of difficulty speaking or slurred speech:    Temporary loss of vision in one eye:   X permanent loss  Problems with dizziness:         Gastrointestinal    Blood in stool:     Vomited blood:         Genitourinary    Burning when urinating:     Blood in urine:        Psychiatric    Major depression:         Hematologic    Bleeding problems:    Problems with blood clotting too easily:        Skin    Rashes or ulcers:        Constitutional    Fever or chills:      PHYSICAL EXAMINATION:  There were no vitals filed for this visit.  General:  WDWN in NAD; vital signs documented above Gait: Not observed HENT: WNL, normocephalic Pulmonary: normal non-labored breathing , without wheezing Cardiac: regular HR,  Abdomen: soft, NT, no masses Skin: without rashes Vascular Exam/Pulses:  Right Left  Radial 2+ (normal) 2+ (normal)  Ulnar 2+ (normal) 2+ (normal)                   Extremities: without ischemic changes, without Gangrene , without cellulitis; without open wounds;  Musculoskeletal: no muscle wasting or atrophy  Neurologic: A&O X 3;  No focal weakness or paresthesias are detected Psychiatric:  The pt has Normal affect.   Non-Invasive Vascular Imaging:   Noninvasive vascular imaging was reviewed including MRI angio head and neck from 07/05/2021 and carotid duplex ultrasound completed on 07/13/2021.  Both imaging modalities demonstrate bilateral carotid artery stenosis Stenosis on the right is less than 50%, stenosis on the left is greater than 50% by MRI.  Is consistent with the carotid duplex ultrasound as  well    ASSESSMENT/PLAN: Meghan Diaz is a 81 y.o. female presenting with nearly 100% vision loss from right retinal artery occlusion.  Patient was seen in the ED with negative MRI.  Further imaging demonstrates less than  50% stenosis in the right internal carotid artery, roughly 50% stenosis in the left internal carotid artery.   The NASCET trial supports revascularization status post stroke with greater than 50% stenosis.  The patient's ocular symptoms are right-sided, however his stenosis on the right is less than 50%.  Therefore, the patient would be best suited with continued best medical management which includes dual antiplatelet therapy and high intensity statin.  I am unsure as to the etiology of the patient's right retinal artery occlusion. Meghan Diaz would be best served with neurology referral, continued dual antiplatelet therapy, and consideration of loop recorder to ensure she does not have paroxysmal atrial fibrillation.  She has no temporal headache or tenderness associated with giant cell arteritis. Meghan Diaz is aware she can follow-up with me as needed, however I will see her in 1 year with follow-up carotid duplex ultrasonography due to her known bilateral carotid artery stenosis.   Broadus John, MD [facility] [phone]

## 2021-07-14 ENCOUNTER — Encounter: Payer: Self-pay | Admitting: Vascular Surgery

## 2021-07-14 ENCOUNTER — Ambulatory Visit: Payer: Medicare Other | Admitting: Vascular Surgery

## 2021-07-14 VITALS — BP 152/69 | HR 68 | Temp 97.9°F | Resp 20 | Ht 67.0 in | Wt 214.0 lb

## 2021-07-14 DIAGNOSIS — H349 Unspecified retinal vascular occlusion: Secondary | ICD-10-CM | POA: Diagnosis not present

## 2021-07-14 MED ORDER — CLOPIDOGREL BISULFATE 75 MG PO TABS: 75.0000 mg | ORAL_TABLET | Freq: Every day | ORAL | 2 refills | Status: DC

## 2021-07-14 MED ORDER — CLOPIDOGREL BISULFATE 75 MG PO TABS: 75.0000 mg | ORAL_TABLET | Freq: Every day | ORAL | 2 refills | Status: AC

## 2021-07-17 ENCOUNTER — Telehealth: Payer: Self-pay

## 2021-07-17 NOTE — Telephone Encounter (Signed)
Received a triage message from Dr. Randon Goldsmith regarding pt. Staff message sent to MD.

## 2021-07-18 ENCOUNTER — Encounter: Payer: Self-pay | Admitting: Diagnostic Neuroimaging

## 2021-07-18 ENCOUNTER — Other Ambulatory Visit: Payer: Self-pay

## 2021-07-18 ENCOUNTER — Ambulatory Visit: Payer: Medicare Other | Admitting: Diagnostic Neuroimaging

## 2021-07-18 VITALS — BP 142/80 | HR 63 | Ht 67.0 in | Wt 209.0 lb

## 2021-07-18 DIAGNOSIS — H3411 Central retinal artery occlusion, right eye: Secondary | ICD-10-CM

## 2021-07-18 NOTE — Patient Instructions (Addendum)
°  RIGHT EYE VISION LOSS (CRAO; central retinal artery occlusion) - continue aspirin 81mg  + plavix 75mg  daily x 3 weeks, then aspirin 81mg  alone - continue rosuvastatin 10mg  daily - BP control - follow up right temporal artery biopsy (ordered by Dr. ; continue prednisone 60mg  daily per Dr. ) - check echocardiogram (will ask PCP to order via [facility] at patient preference) - follow up heart monitor (ordered per PCP)   MEMORY LOSS - could be mild cognitive impairment, stress, anxiety - caution with safety and supervision, living alone

## 2021-07-18 NOTE — Progress Notes (Signed)
[facility]  PATIENT: Meghan Diaz DOB: Dec 30, 1940  REFERRING CLINICIAN: Broadus John, MD HISTORY FROM: patient, friend, and son (via phone) REASON FOR VISIT: new consult    HISTORICAL  CHIEF COMPLAINT:  Chief Complaint  Patient presents with   New Patient (Initial Visit)    RM 7 with friend Dorian Pod son on telephone here for consult on central retinal artery occlusion     HISTORY OF PRESENT ILLNESS:   81 year old female here for evaluation of right eye vision loss.  Symptoms started on 07/05/2021 when patient woke up with near complete vision loss of right eye.  Has had some mild intermittent headaches.  She went to see ophthalmology, was diagnosed with possible right central retinal artery occlusion (CRAO), and referred to emergency room.  She had MRI, MRA testing, lab testing.  Now patient on aspirin and Plavix, Crestor which she just started yesterday.  In addition ESR and CRP were tested, and elevated results prompted ophthalmologist to order right temporal artery biopsy and start patient empirically on prednisone 60 mg daily.  This also was started yesterday.  Patient has chronic short-term memory loss, decreased attention for many years.  This has slightly worsened over past year or 2.   REVIEW OF SYSTEMS: Full 14 system review of systems performed and negative with exception of: as per HPI.   ALLERGIES: Allergies  Allergen Reactions   Codeine Nausea And Vomiting   Molds & Smuts Rash   Penicillins Rash    MOLD derivative; never taken pcn before (allergic to mold)    HOME MEDICATIONS: Outpatient Medications Prior to Visit  Medication Sig Dispense Refill   ALPRAZolam (XANAX) 0.5 MG tablet Take 0.5 mg by mouth at bedtime as needed for anxiety.     aspirin 81 MG EC tablet Take by mouth.     buPROPion (WELLBUTRIN XL) 150 MG 24 hr tablet Take 150 mg by mouth every morning.     clopidogrel (PLAVIX) 75 MG tablet Take 1 tablet (75 mg total) by  mouth daily. 90 tablet 2   levothyroxine (SYNTHROID) 75 MCG tablet Take 1 tablet (75 mcg total) by mouth daily before breakfast. 30 tablet 2   losartan-hydrochlorothiazide (HYZAAR) 50-12.5 MG tablet Take 1 tablet by mouth daily.     predniSONE (DELTASONE) 10 MG tablet Take 60 mg by mouth in the morning.     rosuvastatin (CRESTOR) 10 MG tablet Take 10 mg by mouth at bedtime.     Vitamin D, Ergocalciferol, (DRISDOL) 1.25 MG (50000 UNIT) CAPS capsule Take 1 capsule (50,000 Units total) by mouth every 7 (seven) days. 4 capsule 0   No facility-administered medications prior to visit.    PAST MEDICAL HISTORY: Past Medical History:  Diagnosis Date   Decreased hearing    Depression    Floaters in visual field    HTN (hypertension)    Hyperlipidemia    Hypothyroidism    Obesity     PAST SURGICAL HISTORY: Past Surgical History:  Procedure Laterality Date   ANKLE SURGERY Left     FAMILY HISTORY: Family History  Problem Relation Age of Onset   Diabetes Mother    Hypertension Mother    Kidney disease Mother    Heart disease Mother    Cancer Father     SOCIAL HISTORY: Social History   Socioeconomic History   Marital status: Divorced    Spouse name: Not on file   Number of children: Not on file   Years of education: Not on  file   Highest education level: Not on file  Occupational History   Not on file  Tobacco Use   Smoking status: Never   Smokeless tobacco: Never  Substance and Sexual Activity   Alcohol use: Not on file   Drug use: Not on file   Sexual activity: Not on file  Other Topics Concern   Not on file  Social History Narrative   Right handed    Some caffeine    Social Determinants of Health   Financial Resource Strain: Not on file  Food Insecurity: Not on file  Transportation Needs: Not on file  Physical Activity: Not on file  Stress: Not on file  Social Connections: Not on file  Intimate Partner Violence: Not on file     PHYSICAL EXAM  GENERAL  EXAM/CONSTITUTIONAL: Vitals:  Vitals:   07/18/21 1107  BP: (!) 142/80  Pulse: 63  SpO2: 98%  Weight: 209 lb (94.8 kg)  Height: $Remove'5\' 7"'mgehPrU$  (1.702 m)   Body mass index is 32.73 kg/m. Wt Readings from Last 3 Encounters:  07/18/21 209 lb (94.8 kg)  07/14/21 214 lb (97.1 kg)  06/22/21 217 lb (98.4 kg)   Patient is in no distress; well developed, nourished and groomed; neck is supple  CARDIOVASCULAR: Examination of carotid arteries is normal; no carotid bruits Regular rate and rhythm, no murmurs Examination of peripheral vascular system by observation and palpation is normal  EYES: Ophthalmoscopic exam of optic discs and posterior segments is normal; no papilledema or hemorrhages No results found.  MUSCULOSKELETAL: Gait, strength, tone, movements noted in Neurologic exam below  NEUROLOGIC: MENTAL STATUS:  No flowsheet data found. awake, alert, oriented to person, place and time recent and remote memory intact normal attention and concentration language fluent, comprehension intact, naming intact fund of knowledge appropriate  CRANIAL NERVE:  2nd - no papilledema on fundoscopic exam 2nd, 3rd, 4th, 6th - RIGHT AFFERENT PUPILLARY DEFECT; DECR VISION IN RIGHT EYE; MINIMAL MOTION DETECTION IN RIGHT EYE; extraocular muscles intact, no nystagmus 5th - facial sensation symmetric 7th - facial strength symmetric 8th - hearing intact 9th - palate elevates symmetrically, uvula midline 11th - shoulder shrug symmetric 12th - tongue protrusion midline  MOTOR:  normal bulk and tone, full strength in the BUE, BLE  SENSORY:  normal and symmetric to light touch, temperature, vibration  COORDINATION:  finger-nose-finger, fine finger movements normal  REFLEXES:  deep tendon reflexes present and symmetric  GAIT/STATION:  narrow based gait     DIAGNOSTIC DATA (LABS, IMAGING, TESTING) - I reviewed patient records, labs, notes, testing and imaging myself where available.  Lab  Results  Component Value Date   WBC 7.4 07/05/2021   HGB 16.0 (H) 07/05/2021   HCT 47.0 (H) 07/05/2021   MCV 95.6 07/05/2021   PLT 244 07/05/2021      Component Value Date/Time   NA 138 07/05/2021 1704   NA 138 11/28/2020 1130   K 3.7 07/05/2021 1704   CL 103 07/05/2021 1704   CO2 26 07/05/2021 1618   GLUCOSE 87 07/05/2021 1704   BUN 20 07/05/2021 1704   BUN 13 11/28/2020 1130   CREATININE 0.70 07/05/2021 1704   CALCIUM 9.8 07/05/2021 1618   PROT 7.6 07/05/2021 1618   PROT 6.7 11/28/2020 1130   ALBUMIN 4.3 07/05/2021 1618   ALBUMIN 4.4 11/28/2020 1130   AST 27 07/05/2021 1618   ALT 21 07/05/2021 1618   ALKPHOS 65 07/05/2021 1618   BILITOT 0.8 07/05/2021 1618   BILITOT 0.4  11/28/2020 1130   GFRNONAA >60 07/05/2021 1618   GFRAA 79 12/10/2019 1538   Lab Results  Component Value Date   CHOL 246 (H) 12/10/2019   HDL 99 12/10/2019   LDLCALC 136 (H) 12/10/2019   TRIG 69 12/10/2019   Lab Results  Component Value Date   HGBA1C 5.5 02/05/2019   Lab Results  Component Value Date   VITAMINB12 301 11/28/2020   Lab Results  Component Value Date   TSH 3.120 11/28/2020    07/17/21 Labs   Ref Range & Units 1 d ago  Cholesterol, Total 100 - 199 mg/dL 272 High    Triglycerides 0 - 149 mg/dL 116   HDL >39 mg/dL 74   VLDL Cholesterol Cal 5 - 40 mg/dL 20   LDL 0 - 99 mg/dL 178 High      07/05/21 MRI brain, MRA head / neck 1. No acute intracranial abnormality. 2. Normal intracranial MRA. 3. Approximately 50% stenosis of the proximal left internal carotid artery and mild (less than 50%) narrowing of the proximal right internal carotid artery.  07/13/21 carotid u/s Right Carotid: Velocities in the right ICA are consistent with a 1-39%  stenosis.   Left Carotid: Velocities in the left ICA are consistent with a 40-59%  stenosis.   Vertebrals:  Bilateral vertebral arteries demonstrate antegrade flow.  Subclavians: Normal flow hemodynamics were seen in bilateral subclavian                arteries.     ASSESSMENT AND PLAN  81 y.o. year old female here with:   Dx:  1. Central retinal artery occlusion of right eye      PLAN:  RIGHT EYE VISION LOSS (CRAO; central retinal artery occlusion; likely related to hyperlipidemia; elevated CRP raises possibility of giant cell / temporal arteritis) - continue aspirin 81mg  + plavix 75mg  daily x 3 weeks, then aspirin 81mg  alone - continue rosuvastatin 10mg  daily - BP control - follow up right temporal artery biopsy (ordered by Dr. Prudencio Burly; continue prednisone 60mg  daily per Dr. Prudencio Burly) - check echocardiogram (will ask PCP to order via [facility] at patient preference) - follow up heart monitor (ordered per PCP)  MEMORY LOSS - could be mild cognitive impairment, stress, anxiety - caution with safety and supervision, living alone  Return in about 3 months (around 10/16/2021).  I spent 60 minutes of face-to-face and non-face-to-face time with patient.  This included previsit chart review, lab review, study review, order entry, electronic health record documentation, patient education.     Penni Bombard, MD 0/76/8088, 11:03 PM Certified in Neurology, Neurophysiology and Neuroimaging  [facility] [address] [place], [place] [address] [phone]

## 2021-07-19 ENCOUNTER — Other Ambulatory Visit: Payer: Self-pay

## 2021-07-19 ENCOUNTER — Encounter (HOSPITAL_COMMUNITY): Payer: Self-pay | Admitting: Vascular Surgery

## 2021-07-19 NOTE — Progress Notes (Signed)
DUE TO COVID-19 ONLY ONE VISITOR IS ALLOWED TO COME WITH YOU AND STAY IN THE WAITING ROOM ONLY DURING PRE OP AND PROCEDURE DAY OF SURGERY.   PCP - Nena Polio, NP Cardiologist - n/a  Chest x-ray - 07/05/21 (1V) EKG - 07/05/21 Stress Test - n/a ECHO - n/a Cardiac Cath - n/a  ICD Pacemaker/Loop - n/a  Sleep Study -  n/a CPAP - none  Blood Thinner Instructions:  Follow your surgeon's instructions on when to stop Plavix prior to surgery.  Aspirin Instructions: Follow your surgeon's instructions on when to stop aspirin prior to surgery,  If no instructions were given by your surgeon then you will need to call the office for those instructions.  Anesthesia review: Yes  STOP now taking any Aspirin (unless otherwise instructed by your surgeon), Aleve, Naproxen, Ibuprofen, Motrin, Advil, Goody's, BC's, all herbal medications, fish oil, and all vitamins.   Coronavirus Screening Covid test n/a Ambulatory Surgery  Do you have any of the following symptoms:  Cough yes/no: No Fever (>100.39F)  yes/no: No Runny nose yes/no: No Sore throat yes/no: No Difficulty breathing/shortness of breath  yes/no: No  Have you traveled in the last 14 days and where? yes/no: No  Patient verbalized understanding of instructions that were given via phone.

## 2021-07-19 NOTE — Anesthesia Preprocedure Evaluation (Addendum)
Anesthesia Evaluation  Patient identified by MRN, date of birth, ID band Patient awake    Reviewed: Allergy & Precautions, NPO status , Patient's Chart, lab work & pertinent test results  Airway Mallampati: II  TM Distance: >3 FB Neck ROM: Full    Dental  (+) Dental Advisory Given, Caps,    Pulmonary neg pulmonary ROS,    Pulmonary exam normal breath sounds clear to auscultation       Cardiovascular hypertension, negative cardio ROS Normal cardiovascular exam Rhythm:Regular Rate:Normal     Neuro/Psych PSYCHIATRIC DISORDERS Anxiety Depression negative neurological ROS     GI/Hepatic negative GI ROS, Neg liver ROS,   Endo/Other  Hypothyroidism   Renal/GU negative Renal ROS     Musculoskeletal negative musculoskeletal ROS (+)   Abdominal   Peds  Hematology negative hematology ROS (+)   Anesthesia Other Findings   Reproductive/Obstetrics                            Anesthesia Physical Anesthesia Plan  ASA: 2  Anesthesia Plan: MAC   Post-op Pain Management: Minimal or no pain anticipated and Tylenol PO (pre-op)   Induction: Intravenous  PONV Risk Score and Plan: 3 and Ondansetron, Dexamethasone and Treatment may vary due to age or medical condition  Airway Management Planned: Natural Airway  Additional Equipment:   Intra-op Plan:   Post-operative Plan:   Informed Consent: I have reviewed the patients History and Physical, chart, labs and discussed the procedure including the risks, benefits and alternatives for the proposed anesthesia with the patient or authorized representative who has indicated his/her understanding and acceptance.     Dental advisory given  Plan Discussed with: CRNA  Anesthesia Plan Comments: (PAT note written 07/19/2021 by Myra Gianotti, PA-C. )      Anesthesia Quick Evaluation

## 2021-07-19 NOTE — Progress Notes (Signed)
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  History includes never smoker, HTN, HLD, hypothyroidism, decreased hearing. + COVID-19 test 07/05/21.   - ED visit 07/05/21 for monocular vision loss right eye on waking that morning. Had seen her ophthalmologist Dr. 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 Head CT and MRI showed no acute abnormality. Brain MRA was normal. There was ~ 83% LICA stenosis and < 33% RICA stenosis by CTA. ED provider spoke with vascular surgery and neurology. She was started on ASA and Plavix and out-patient follow-up arranged. She also had a positive COVID-19 test with "mild cold" symptoms 3-4 days prior. No hypoxemia. PCXR showed no consolidation, subtle interstitial marking suggesting scarrin or interstitial pneumonitis. .   She saw vascular surgeon Dr. Virl Cagey on 07/14/21. Medical management for carotid disease recommended with follow-up in one year. He did did not think right retinal artery occlusion was the source. He recommended continue DAPT, follow-up with neurology, and consideration of loop recorder to look for PAF. She was not having temporal headaches or tenderness that would be expected with giant cell arteritis.   By notes, she had follow-up with ophthalmology. ESR and CRP were elevated, so she was started on prednisone 60 mg daily on 07/17/21 with recommendation for temporal artery biopsy. She had now reported some mild intermittent headaches. She was seen by neurologist Dr. Leta Baptist on 07/18/21. He notes plans for right temporal artery biopsy. Temporal arteritis a possible etiology of right eye vision loss give elevated CRP,  although HLD favored as the etiology. For now continue DAPT x 3 weeks, then ASA alone. Continue statin. Continue prednisone per Dr. Prudencio Burly. He will ask PCP at [facility] to order echo and heart monitor to complete work-up (patient preferred tests be done through [facility]).   Per posting, continue ASA and hold Plavix. Last Plavix 07/18/21. Anesthesia team to evaluate on the day of surgery. She had labs on 07/05/21 which included a CBC, CMP, PT/PTT.   VS:  BP Readings from Last 3 Encounters:  07/18/21 (!) 142/80  07/14/21 (!) 152/69  07/06/21 (!) 142/72   Pulse Readings from Last 3 Encounters:  07/18/21 63  07/14/21 68  07/06/21 71     PROVIDERS: Nena Polio, NP is PCP  Andrey Spearman, MD is neurologist   LABS: Most recent lab results include: Lab Results  Component Value Date   WBC 7.4 07/05/2021   HGB 16.0 (H) 07/05/2021   HCT 47.0 (H) 07/05/2021   PLT 244 07/05/2021   GLUCOSE 87 07/05/2021   ALT 21 07/05/2021   AST 27 07/05/2021   NA 138 07/05/2021   K 3.7 07/05/2021   CL 103 07/05/2021   CREATININE 0.70 07/05/2021   BUN 20 07/05/2021   CO2 26 07/05/2021   TSH 3.120 11/28/2020   INR 1.0 07/05/2021     IMAGES: MRI Head & MRA Head/Neck 07/05/21: IMPRESSION: 1. No acute intracranial abnormality. 2. Normal intracranial MRA. 3. Approximately 50% stenosis of the proximal left internal carotid artery and mild (less than 50%) narrowing of the proximal right internal carotid artery.  CT Head 07/05/21: IMPRESSION: No acute intracranial  findings or mass lesions.   1V PCXR 07/05/21 (+ COVID-19 test 07/05/21): IMPRESSION: - There are no signs of pulmonary edema or focal pulmonary consolidation. - Small subtle focus of increase in interstitial markings are seen in the lateral aspect of left lower lung fields suggesting scarring or interstitial pneumonitis.    EKG: 07/05/21: ST at 102 bpm, Frequent PVCs. LAD. Moderate voltage criteria for LVH, may be normal  variant.  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  - Left Carotid: Velocities in the left ICA are consistent with a 40-59%  stenosis.  - Vertebrals:  Bilateral vertebral arteries demonstrate antegrade flow.  - Subclavians: Normal flow hemodynamics were seen in bilateral subclavian arteries.    Past Medical History:  Diagnosis Date   Anxiety    xanax prn   Decreased hearing    bilaterl - no hearing aids   Depression    Floaters in visual field    HTN (hypertension)    Hyperlipidemia    Hypothyroidism    Obesity     Past Surgical History:  Procedure Laterality Date   ABDOMINAL HYSTERECTOMY     ANKLE SURGERY Left    with metal hardware   COLONOSCOPY     x several   EYE SURGERY Bilateral    cataracts removed   TONSILLECTOMY      MEDICATIONS: No current facility-administered medications for this encounter.    ALPRAZolam (XANAX) 0.5 MG tablet   aspirin 81 MG EC tablet   buPROPion (WELLBUTRIN XL) 150 MG 24 hr tablet   clopidogrel (PLAVIX) 75 MG tablet   Homeopathic Products (LEG CRAMPS SL)   levothyroxine (SYNTHROID) 50 MCG tablet   losartan-hydrochlorothiazide (HYZAAR) 100-25 MG tablet   predniSONE (DELTASONE) 10 MG tablet   rosuvastatin (CRESTOR) 10 MG tablet   Vitamin D, Ergocalciferol, (DRISDOL) 1.25 MG (50000 UNIT) CAPS capsule   levothyroxine (SYNTHROID) 75 MCG tablet    Myra Gianotti, PA-C Surgical Short Stay/Anesthesiology [facility] Phone [phone] [facility] Phone [phone] 07/19/2021 2:06 PM

## 2021-07-20 ENCOUNTER — Encounter (HOSPITAL_COMMUNITY)
Admission: RE | Disposition: A | Discharge status: Home / Self Care | Payer: Self-pay | Source: Home / Self Care | Attending: Vascular Surgery

## 2021-07-20 ENCOUNTER — Encounter (HOSPITAL_COMMUNITY): Payer: Self-pay | Admitting: Vascular Surgery

## 2021-07-20 ENCOUNTER — Ambulatory Visit (HOSPITAL_COMMUNITY): Payer: Medicare Other | Admitting: Vascular Surgery

## 2021-07-20 ENCOUNTER — Other Ambulatory Visit: Payer: Self-pay

## 2021-07-20 ENCOUNTER — Telehealth: Payer: Self-pay

## 2021-07-20 ENCOUNTER — Ambulatory Visit (HOSPITAL_COMMUNITY)
Admission: RE | Admit: 2021-07-20 | Discharge: 2021-07-20 | Disposition: A | Discharge status: Home / Self Care | Payer: Medicare Other | Attending: Vascular Surgery | Admitting: Vascular Surgery

## 2021-07-20 DIAGNOSIS — Z7982 Long term (current) use of aspirin: Secondary | ICD-10-CM | POA: Diagnosis not present

## 2021-07-20 DIAGNOSIS — E039 Hypothyroidism, unspecified: Secondary | ICD-10-CM | POA: Diagnosis not present

## 2021-07-20 DIAGNOSIS — I10 Essential (primary) hypertension: Secondary | ICD-10-CM | POA: Diagnosis not present

## 2021-07-20 DIAGNOSIS — I6523 Occlusion and stenosis of bilateral carotid arteries: Secondary | ICD-10-CM | POA: Insufficient documentation

## 2021-07-20 DIAGNOSIS — I708 Atherosclerosis of other arteries: Secondary | ICD-10-CM | POA: Diagnosis not present

## 2021-07-20 HISTORY — PX: ARTERY BIOPSY: SHX891

## 2021-07-20 HISTORY — DX: Anxiety disorder, unspecified: F41.9

## 2021-07-20 LAB — POCT I-STAT, CHEM 8
BUN: 34 mg/dL — ABNORMAL HIGH (ref 8–23)
Calcium, Ion: 1.18 mmol/L (ref 1.15–1.40)
Chloride: 101 mmol/L (ref 98–111)
Creatinine, Ser: 0.9 mg/dL (ref 0.44–1.00)
Glucose, Bld: 91 mg/dL (ref 70–99)
HCT: 41 % (ref 36.0–46.0)
Hemoglobin: 13.9 g/dL (ref 12.0–15.0)
Potassium: 3.5 mmol/L (ref 3.5–5.1)
Sodium: 140 mmol/L (ref 135–145)
TCO2: 29 mmol/L (ref 22–32)

## 2021-07-20 SURGERY — BIOPSY TEMPORAL ARTERY
Anesthesia: Monitor Anesthesia Care | Laterality: Right

## 2021-07-20 MED ORDER — CHLORHEXIDINE GLUCONATE 4 % EX LIQD: 60.0000 mL | Freq: Once | CUTANEOUS | Status: DC

## 2021-07-20 MED ORDER — 0.9 % SODIUM CHLORIDE (POUR BTL) OPTIME
TOPICAL | Status: DC | PRN
  Administered 2021-07-20: 1000 mL

## 2021-07-20 MED ORDER — LIDOCAINE-EPINEPHRINE (PF) 1 %-1:200000 IJ SOLN
INTRAMUSCULAR | Status: DC | PRN
  Administered 2021-07-20: 7 mL

## 2021-07-20 MED ORDER — LIDOCAINE HCL (PF) 1 % IJ SOLN
INTRAMUSCULAR | Status: AC
  Filled 2021-07-20: qty 30

## 2021-07-20 MED ORDER — LIDOCAINE HCL (PF) 1 % IJ SOLN
INTRAMUSCULAR | Status: DC | PRN
  Administered 2021-07-20: 1 mL

## 2021-07-20 MED ORDER — SODIUM CHLORIDE 0.9 % IV SOLN: INTRAVENOUS | Status: DC

## 2021-07-20 MED ORDER — LIDOCAINE 2% (20 MG/ML) 5 ML SYRINGE
INTRAMUSCULAR | Status: DC | PRN
  Administered 2021-07-20: 100 mg via INTRAVENOUS

## 2021-07-20 MED ORDER — CHLORHEXIDINE GLUCONATE 0.12 % MT SOLN
OROMUCOSAL | Status: AC
  Administered 2021-07-20: 15 mL
  Filled 2021-07-20: qty 15

## 2021-07-20 MED ORDER — PROPOFOL 500 MG/50ML IV EMUL
INTRAVENOUS | Status: DC | PRN
  Administered 2021-07-20: 50 ug/kg/min via INTRAVENOUS

## 2021-07-20 MED ORDER — PROPOFOL 10 MG/ML IV BOLUS
INTRAVENOUS | Status: DC | PRN
  Administered 2021-07-20: 10 mg via INTRAVENOUS
  Administered 2021-07-20 (×2): 20 mg via INTRAVENOUS
  Administered 2021-07-20: 10 mg via INTRAVENOUS

## 2021-07-20 MED ORDER — LIDOCAINE-EPINEPHRINE (PF) 1 %-1:200000 IJ SOLN
INTRAMUSCULAR | Status: AC
  Filled 2021-07-20: qty 30

## 2021-07-20 MED ORDER — PROPOFOL 500 MG/50ML IV EMUL: INTRAVENOUS | Status: DC | PRN

## 2021-07-20 MED ORDER — TRAMADOL HCL 50 MG PO TABS: 50.0000 mg | ORAL_TABLET | Freq: Four times a day (QID) | ORAL | 0 refills | Status: AC | PRN

## 2021-07-20 MED ORDER — VANCOMYCIN HCL IN DEXTROSE 1-5 GM/200ML-% IV SOLN
1000.0000 mg | INTRAVENOUS | Status: AC
  Administered 2021-07-20: 1000 mg via INTRAVENOUS
  Filled 2021-07-20: qty 200

## 2021-07-20 MED ORDER — LACTATED RINGERS IV SOLN: INTRAVENOUS | Status: DC | PRN

## 2021-07-20 SURGICAL SUPPLY — 47 items
ADH SKN CLS APL DERMABOND .7 (GAUZE/BANDAGES/DRESSINGS) ×1
BAG COUNTER SPONGE SURGICOUNT (BAG) ×2 IMPLANT
BAG SPNG CNTER NS LX DISP (BAG) ×1
BALL CTTN LRG ABS STRL LF (GAUZE/BANDAGES/DRESSINGS) ×2
CANISTER SUCT 3000ML PPV (MISCELLANEOUS) ×2 IMPLANT
CLIP LIGATING EXTRA SM BLUE (MISCELLANEOUS) ×1 IMPLANT
CNTNR URN SCR LID CUP LEK RST (MISCELLANEOUS) ×1 IMPLANT
CONT SPEC 4OZ STRL OR WHT (MISCELLANEOUS) ×2
COTTONBALL LRG STERILE PKG (GAUZE/BANDAGES/DRESSINGS) ×3 IMPLANT
COVER SURGICAL LIGHT HANDLE (MISCELLANEOUS) ×2 IMPLANT
DECANTER SPIKE VIAL GLASS SM (MISCELLANEOUS) ×2 IMPLANT
DERMABOND ADVANCED (GAUZE/BANDAGES/DRESSINGS) ×1
DERMABOND ADVANCED .7 DNX12 (GAUZE/BANDAGES/DRESSINGS) ×1 IMPLANT
DRAPE OPHTHALMIC 77X100 STRL (CUSTOM PROCEDURE TRAY) ×2 IMPLANT
ELECT NDL BLADE 2-5/6 (NEEDLE) ×1 IMPLANT
ELECT NDL TIP 2.8 STRL (NEEDLE) IMPLANT
ELECT NEEDLE BLADE 2-5/6 (NEEDLE) ×2 IMPLANT
ELECT NEEDLE TIP 2.8 STRL (NEEDLE) ×2 IMPLANT
ELECT REM PT RETURN 9FT ADLT (ELECTROSURGICAL) ×2
ELECTRODE REM PT RTRN 9FT ADLT (ELECTROSURGICAL) ×1 IMPLANT
GAUZE 4X4 16PLY ~~LOC~~+RFID DBL (SPONGE) ×2 IMPLANT
GEL ULTRASOUND 8.5O AQUASONIC (MISCELLANEOUS) ×2 IMPLANT
GLOVE SRG 8 PF TXTR STRL LF DI (GLOVE) ×2 IMPLANT
GLOVE SURG POLYISO LF SZ8 (GLOVE) IMPLANT
GLOVE SURG UNDER POLY LF SZ8 (GLOVE) ×4
GOWN STRL REUS W/ TWL LRG LVL3 (GOWN DISPOSABLE) ×1 IMPLANT
GOWN STRL REUS W/TWL 2XL LVL3 (GOWN DISPOSABLE) ×2 IMPLANT
GOWN STRL REUS W/TWL LRG LVL3 (GOWN DISPOSABLE) ×2
KIT BASIN OR (CUSTOM PROCEDURE TRAY) ×2 IMPLANT
KIT TURNOVER KIT B (KITS) ×2 IMPLANT
LOOP VESSEL MINI RED (MISCELLANEOUS) ×2 IMPLANT
NDL HYPO 25GX1X1/2 BEV (NEEDLE) ×1 IMPLANT
NEEDLE HYPO 25GX1X1/2 BEV (NEEDLE) ×2 IMPLANT
NS IRRIG 1000ML POUR BTL (IV SOLUTION) ×2 IMPLANT
PACK GENERAL/GYN (CUSTOM PROCEDURE TRAY) ×2 IMPLANT
PAD ARMBOARD 7.5X6 YLW CONV (MISCELLANEOUS) ×4 IMPLANT
SUCTION FRAZIER HANDLE 10FR (MISCELLANEOUS) ×2
SUCTION TUBE FRAZIER 10FR DISP (MISCELLANEOUS) ×1 IMPLANT
SUT MNCRL AB 4-0 PS2 18 (SUTURE) ×2 IMPLANT
SUT PROLENE 6 0 BV (SUTURE) IMPLANT
SUT SILK 3 0 (SUTURE)
SUT SILK 3-0 18XBRD TIE 12 (SUTURE) IMPLANT
SUT VIC AB 3-0 SH 27 (SUTURE) ×2
SUT VIC AB 3-0 SH 27X BRD (SUTURE) ×1 IMPLANT
SYR CONTROL 10ML LL (SYRINGE) ×2 IMPLANT
TOWEL GREEN STERILE (TOWEL DISPOSABLE) ×2 IMPLANT
WATER STERILE IRR 1000ML POUR (IV SOLUTION) ×2 IMPLANT

## 2021-07-20 NOTE — Telephone Encounter (Signed)
Patient's son Meghan Diaz called.  Says patient is having pain level 6-7 out of 10.  She cannot close her right eye.  Per Dr. Karin Lieu, Ibuprofen and Tylenol for pain.  He will call patient's son to discuss other symptoms.

## 2021-07-20 NOTE — Transfer of Care (Signed)
Immediate Anesthesia Transfer of Care Note  Patient: Meghan Diaz  Procedure(s) Performed: RIGHT TEMPORAL ARTERY BIOPSY (Right)  Patient Location: PACU  Anesthesia Type:MAC  Level of Consciousness: drowsy and patient cooperative  Airway & Oxygen Therapy: Patient Spontanous Breathing  Post-op Assessment: Report given to RN and Post -op Vital signs reviewed and stable  Post vital signs: Reviewed and stable  Last Vitals:  Vitals Value Taken Time  BP 137/58 07/20/21 1119  Temp 36.2 C 07/20/21 1119  Pulse 50 07/20/21 1120  Resp 21 07/20/21 1120  SpO2 100 % 07/20/21 1120  Vitals shown include unvalidated device data.  Last Pain:  Vitals:   07/20/21 0657  TempSrc:   PainSc: 0-No pain         Complications: No notable events documented.

## 2021-07-20 NOTE — H&P (Signed)
Office Note   Patient seen and examined in preop holding.  No complaints. In short, Meghan patient is a 81 year old female with recent right retinal artery occlusion.  Meghan Diaz had a stroke work-up demonstrating no infarcts.  Meghan Diaz was seen in my office due to bilateral carotid artery stenosis.  This was less than 50% therefore we elected to continue treating medically.  At Meghan time, Meghan Diaz had no temple pain or symptoms of headache.  Temporal arteritis-giant cell arteritis was low on Meghan differential.  CRP was obtained by ophthalmologist, which proved to be elevated.  After discussing Meghan risk and benefits of temporal artery biopsy in an effort to prove or disprove giant cell arteritis, Meghan Diaz and her son Meghan Diaz elected to proceed. No changes to medication history or physical exam since last seen in clinic. After discussing Meghan risks and benefits of right-sided temporal artery biopsy, Meghan Diaz elected to proceed.   Victorino Sparrow MD   CC: Right retinal artery occlusion with bilateral internal carotid artery stenosis Requesting Provider:  No ref. provider found  HPI: Meghan Diaz is a 81 y.o. ([birth year]) female presenting at Meghan request of .Primus Bravo, NP for evaluation of possible symptomatic internal carotid artery stenosis.  Meghan Diaz initially presented to her ophthalmologist Meghan Diaz from [place] [facility] end of last month with vision loss in Meghan right eye.  On further examination, there was concern for retinal artery occlusion, and possible stroke.  Meghan Diaz was immediately taken to Meghan emergency department for stroke work-up.  MRI and CT scans were obtained demonstrating no appreciable areas of infarct.  MRI noted less than 50% stenosis in Meghan right internal carotid artery, greater than 50% stenosis in Meghan left internal carotid artery.  Meghan Diaz was asked to follow-up with vascular surgery in Meghan outpatient setting for further work-up.  On exam today, Meghan Diaz was  accompanied by her son who lives in [place] full-time.  He has been in [place] area over Meghan last several days in an effort to manage her care.  Meghan Diaz denied new symptoms concerning for TIA, stroke.  Meghan Diaz denies history of temporal headache, temporal pain, irregular heartbeat. Meghan Diaz continues to have vision loss in Meghan right eye.   Meghan Diaz is not on a statin for cholesterol management.  Meghan Diaz is on a daily aspirin.   Other AC:  plavix Meghan Diaz is on medication for hypertension.   Meghan Diaz is not diabetic.  Tobacco hx:  -  Past Medical History:  Diagnosis Date   Anxiety    xanax prn   Decreased hearing    bilaterl - no hearing aids   Depression    Floaters in visual field    HTN (hypertension)    Hyperlipidemia    Hypothyroidism    Obesity     Past Surgical History:  Procedure Laterality Date   ABDOMINAL HYSTERECTOMY     ANKLE SURGERY Left    with metal hardware   COLONOSCOPY     x several   EYE SURGERY Bilateral    cataracts removed   TONSILLECTOMY      Social History   Socioeconomic History   Marital status: Divorced    Spouse name: Not on file   Number of children: Not on file   Years of education: Not on file   Highest education level: Not on file  Occupational History   Not on file  Tobacco Use   Smoking status: Never   Smokeless tobacco: Never  Vaping Use   Vaping Use: Never used  Substance and Sexual Activity   Alcohol use: Never   Drug use: Never   Sexual activity: Not on file    Comment: Hysterectomy  Other Topics Concern   Not on file  Social History Narrative   Right handed    Some caffeine    Social Determinants of Health   Financial Resource Strain: Not on file  Food Insecurity: Not on file  Transportation Needs: Not on file  Physical Activity: Not on file  Stress: Not on file  Social Connections: Not on file  Intimate Partner Violence: Not on file    Family History  Problem Relation Age of Onset   Diabetes Mother    Hypertension  Mother    Kidney disease Mother    Heart disease Mother    Cancer Father     Current Facility-Administered Medications  Medication Dose Route Frequency Provider Last Rate Last Admin   0.9 %  sodium chloride infusion   Intravenous Continuous Victorino Sparrow, MD 10 mL/hr at 07/20/21 0726 New Bag at 07/20/21 0726   chlorhexidine (HIBICLENS) 4 % liquid 4 application  60 mL Topical Once Victorino Sparrow, MD       And   [START ON 07/21/2021] chlorhexidine (HIBICLENS) 4 % liquid 4 application  60 mL Topical Once Victorino Sparrow, MD        Allergies  Allergen Reactions   Codeine Nausea And Vomiting   Molds & Smuts Rash   Penicillins Rash    MOLD derivative; never taken pcn before (allergic to mold)     REVIEW OF SYSTEMS:   [X]  denotes positive finding, [ ]  denotes negative finding Cardiac  Comments:  Chest pain or chest pressure:    Shortness of breath upon exertion:    Short of breath when lying flat:    Irregular heart rhythm:        Vascular    Pain in calf, thigh, or hip brought on by ambulation:    Pain in feet at night that wakes you up from your sleep:     Blood clot in your veins:    Leg swelling:         Pulmonary    Oxygen at home:    Productive cough:     Wheezing:         Neurologic    Sudden weakness in arms or legs:     Sudden numbness in arms or legs:     Sudden onset of difficulty speaking or slurred speech:    Temporary loss of vision in one eye:   X permanent loss  Problems with dizziness:         Gastrointestinal    Blood in stool:     Vomited blood:         Genitourinary    Burning when urinating:     Blood in urine:        Psychiatric    Major depression:         Hematologic    Bleeding problems:    Problems with blood clotting too easily:        Skin    Rashes or ulcers:        Constitutional    Fever or chills:      PHYSICAL EXAMINATION:  Vitals:   07/20/21 0651  BP: (!) 180/85  Pulse: 70  Resp: 18  Temp: (!) 97.4 F (36.3  C)  TempSrc: Oral  SpO2: 98%  Weight: 86.2 kg  Height: 5\' 8"  (1.727 m)    General:  WDWN in NAD; vital signs documented above Gait: Not observed HENT: WNL, normocephalic Pulmonary: normal non-labored breathing , without wheezing Cardiac: regular HR,  Abdomen: soft, NT, no masses Skin: without rashes Vascular Exam/Pulses:  Right Left  Radial 2+ (normal) 2+ (normal)  Ulnar 2+ (normal) 2+ (normal)                   Extremities: without ischemic changes, without Gangrene , without cellulitis; without open wounds;  Musculoskeletal: no muscle wasting or atrophy  Neurologic: A&O X 3;  No focal weakness or paresthesias are detected Psychiatric:  Meghan Diaz has Normal affect.   Non-Invasive Vascular Imaging:   Noninvasive vascular imaging was reviewed including MRI angio head and neck from 07/05/2021 and carotid duplex ultrasound completed on 07/13/2021.  Both imaging modalities demonstrate bilateral carotid artery stenosis Stenosis on Meghan right is less than 50%, stenosis on Meghan left is greater than 50% by MRI.  Is consistent with Meghan carotid duplex ultrasound as well    ASSESSMENT/PLAN: Meghan Diaz is a 81 y.o. female presenting with nearly 100% vision loss from right retinal artery occlusion.  Patient was seen in Meghan ED with negative MRI.  Further imaging demonstrates less than 50% stenosis in Meghan right internal carotid artery, roughly 50% stenosis in Meghan left internal carotid artery.   Meghan NASCET trial supports revascularization status post stroke with greater than 50% stenosis.  Meghan patient's ocular symptoms are right-sided, however his stenosis on Meghan right is less than 50%.  Therefore, Meghan patient Diaz be best suited with continued best medical management which includes dual antiplatelet therapy and high intensity statin.  I am unsure as to Meghan etiology of Meghan patient's right retinal artery occlusion. Inez Catalinadwena Diaz be best served with neurology referral, continued dual  antiplatelet therapy, and consideration of loop recorder to ensure Meghan Diaz does not have paroxysmal atrial fibrillation.  Meghan Diaz has no temporal headache or tenderness associated with giant cell arteritis. Molly MaduroRobert is aware Meghan Diaz can follow-up with me as needed, however I will see her in 1 year with follow-up carotid duplex ultrasonography due to her known bilateral carotid artery stenosis.   Victorino SparrowJoshua E Janisa Labus, MD [facility] [phone]

## 2021-07-20 NOTE — Op Note (Signed)
° ° °  NAME: [name]    MRN: [id number] DOB: 1941-04-27    DATE OF OPERATION: 07/20/2021  PREOP DIAGNOSIS:    Concern for giant cell arteritis  POSTOP DIAGNOSIS:    Concern for giant cell arteritis  PROCEDURE:    Right temporal artery biopsy  SURGEON: Victorino Sparrow  ASSIST: None  ANESTHESIA: Moderate  EBL: 5 mL  INDICATIONS:    Meghan Diaz is a 81 y.o. female with recent right retinal artery occlusion.  She had a stroke work-up demonstrating no infarcts.  She was seen in my office due to bilateral carotid artery stenosis.  This was less than 50% therefore we elected to continue treating medically.  At the time, she had no temple pain or symptoms of headache.  Temporal arteritis-giant cell arteritis was low on the differential.  CRP was obtained by ophthalmologist, which proved to be elevated.  After discussing the risk and benefits of temporal artery biopsy in an effort to prove or disprove giant cell arteritis, [name] and her son [name] elected to proceed. No changes to medication history or physical exam since last seen in clinic.  After discussing the risks and benefits of right-sided temporal artery biopsy, [name] elected to proceed.     FINDINGS:   2 cm segment of right temporal artery sent to pathology  TECHNIQUE:   Patient was brought to the OR laid in the supine position.  Moderate anesthesia was induced and the patient's prepped and draped in standard fashion.  A timeout was performed.  The case began with Doppler insonation of the right temporal artery.  This was marked at the ear in a previously appreciated skin crease.  The area was anesthetized using 1% lidocaine with epinephrine.  A 3 cm longitudinal incision was made parallel to the right ear.  Soft tissue was dissected with cautery and the temporal artery was identified.  The temporal artery was freed of soft tissue and was controlled using 3-0 silk with small clips both proximally and  distally.  A 2 cm segment was resected and sent to pathology.  Hemostasis was achieved with the use of cautery.  The area was irrigated using normal saline, and closed with 3-0 Vicryl, 4-0 Monocryl and Dermabond at the skin.  The patient was taken to PACU in stable condition.  Ladonna Snide, MD [facility]  DATE OF DICTATION:   07/20/2021

## 2021-07-20 NOTE — Anesthesia Postprocedure Evaluation (Signed)
Anesthesia Post Note  Patient: Meghan Diaz  Procedure(s) Performed: RIGHT TEMPORAL ARTERY BIOPSY (Right)     Patient location during evaluation: PACU Anesthesia Type: MAC Level of consciousness: awake and alert Pain management: pain level controlled Vital Signs Assessment: post-procedure vital signs reviewed and stable Respiratory status: spontaneous breathing Cardiovascular status: stable Anesthetic complications: no   No notable events documented.  Last Vitals:  Vitals:   07/20/21 1119 07/20/21 1133  BP: (!) 137/58 (!) 150/65  Pulse: (!) 52 (!) 56  Resp: 16 16  Temp: (!) 36.2 C 36.6 C  SpO2: 100% 97%    Last Pain:  Vitals:   07/20/21 1133  TempSrc:   PainSc: 0-No pain                 Nolon Nations

## 2021-07-21 ENCOUNTER — Encounter: Payer: Self-pay | Admitting: Ophthalmology

## 2021-07-21 ENCOUNTER — Encounter (HOSPITAL_COMMUNITY): Payer: Self-pay | Admitting: Vascular Surgery

## 2021-07-21 LAB — SURGICAL PATHOLOGY

## 2021-07-25 ENCOUNTER — Encounter: Payer: Medicare HMO | Admitting: Vascular Surgery

## 2021-07-25 ENCOUNTER — Encounter (HOSPITAL_COMMUNITY): Payer: Medicare HMO

## 2021-07-26 ENCOUNTER — Ambulatory Visit (INDEPENDENT_AMBULATORY_CARE_PROVIDER_SITE_OTHER): Payer: Medicare Other | Admitting: Family Medicine

## 2021-09-05 ENCOUNTER — Telehealth: Payer: Self-pay | Admitting: Diagnostic Neuroimaging

## 2021-09-05 NOTE — Telephone Encounter (Signed)
LVM for patient that Dr. Marjory Lies is taking off the week of April 10 and I moved her appointment up a week to April 3 at 2:30 and to call us back if that date/time will not work for her.

## 2021-10-09 ENCOUNTER — Ambulatory Visit: Payer: Medicare Other | Admitting: Diagnostic Neuroimaging

## 2021-10-16 ENCOUNTER — Ambulatory Visit: Payer: Medicare Other | Admitting: Diagnostic Neuroimaging

## 2022-02-14 ENCOUNTER — Encounter (INDEPENDENT_AMBULATORY_CARE_PROVIDER_SITE_OTHER): Payer: Self-pay

## 2023-08-03 ENCOUNTER — Emergency Department (HOSPITAL_BASED_OUTPATIENT_CLINIC_OR_DEPARTMENT_OTHER)
Admission: EM | Admit: 2023-08-03 | Discharge: 2023-08-03 | Disposition: A | Discharge status: Home / Self Care | Payer: Medicare Other | Attending: Emergency Medicine | Admitting: Emergency Medicine

## 2023-08-03 ENCOUNTER — Encounter (HOSPITAL_BASED_OUTPATIENT_CLINIC_OR_DEPARTMENT_OTHER): Payer: Self-pay | Admitting: Emergency Medicine

## 2023-08-03 DIAGNOSIS — Z7902 Long term (current) use of antithrombotics/antiplatelets: Secondary | ICD-10-CM | POA: Insufficient documentation

## 2023-08-03 DIAGNOSIS — Z7982 Long term (current) use of aspirin: Secondary | ICD-10-CM | POA: Insufficient documentation

## 2023-08-03 DIAGNOSIS — K6289 Other specified diseases of anus and rectum: Secondary | ICD-10-CM | POA: Diagnosis present

## 2023-08-03 DIAGNOSIS — K5641 Fecal impaction: Secondary | ICD-10-CM | POA: Diagnosis not present

## 2023-08-03 LAB — CBC WITH DIFFERENTIAL/PLATELET
Abs Immature Granulocytes: 0.03 10*3/uL (ref 0.00–0.07)
Basophils Absolute: 0 10*3/uL (ref 0.0–0.1)
Basophils Relative: 0 %
Eosinophils Absolute: 0 10*3/uL (ref 0.0–0.5)
Eosinophils Relative: 0 %
HCT: 39.9 % (ref 36.0–46.0)
Hemoglobin: 13.6 g/dL (ref 12.0–15.0)
Immature Granulocytes: 0 %
Lymphocytes Relative: 11 %
Lymphs Abs: 0.8 10*3/uL (ref 0.7–4.0)
MCH: 32.1 pg (ref 26.0–34.0)
MCHC: 34.1 g/dL (ref 30.0–36.0)
MCV: 94.1 fL (ref 80.0–100.0)
Monocytes Absolute: 0.3 10*3/uL (ref 0.1–1.0)
Monocytes Relative: 4 %
Neutro Abs: 6.3 10*3/uL (ref 1.7–7.7)
Neutrophils Relative %: 85 %
Platelets: 208 10*3/uL (ref 150–400)
RBC: 4.24 MIL/uL (ref 3.87–5.11)
RDW: 12.2 % (ref 11.5–15.5)
WBC: 7.5 10*3/uL (ref 4.0–10.5)
nRBC: 0 % (ref 0.0–0.2)

## 2023-08-03 LAB — COMPREHENSIVE METABOLIC PANEL
ALT: 15 U/L (ref 0–44)
AST: 23 U/L (ref 15–41)
Albumin: 4.3 g/dL (ref 3.5–5.0)
Alkaline Phosphatase: 59 U/L (ref 38–126)
Anion gap: 13 (ref 5–15)
BUN: 19 mg/dL (ref 8–23)
CO2: 23 mmol/L (ref 22–32)
Calcium: 9.7 mg/dL (ref 8.9–10.3)
Chloride: 96 mmol/L — ABNORMAL LOW (ref 98–111)
Creatinine, Ser: 0.8 mg/dL (ref 0.44–1.00)
GFR, Estimated: >60 mL/min (ref >60)
Glucose, Bld: 104 mg/dL — ABNORMAL HIGH (ref 70–99)
Potassium: 3.4 mmol/L — ABNORMAL LOW (ref 3.5–5.1)
Sodium: 132 mmol/L — ABNORMAL LOW (ref 135–145)
Total Bilirubin: 0.7 mg/dL (ref 0.0–1.2)
Total Protein: 7.2 g/dL (ref 6.5–8.1)

## 2023-08-03 MED ORDER — FLEET ENEMA RE ENEM
1.0000 | ENEMA | Freq: Once | RECTAL | Status: AC
  Administered 2023-08-03: 1 via RECTAL
  Filled 2023-08-03: qty 1

## 2023-08-03 MED ORDER — POLYETHYLENE GLYCOL 3350 17 GM/SCOOP PO POWD: 1.0000 | Freq: Once | ORAL | 0 refills | Status: AC

## 2023-08-03 MED ORDER — LIDOCAINE HCL URETHRAL/MUCOSAL 2 % EX GEL
1.0000 | Freq: Once | CUTANEOUS | Status: AC
Start: 2023-08-03 — End: 2023-08-03
  Administered 2023-08-03: 1 via TOPICAL
  Filled 2023-08-03: qty 11

## 2023-08-03 NOTE — ED Notes (Signed)
Fleets given, encouraged to retain it and BSC placed at bedside for BM.  Call bell in reach

## 2023-08-03 NOTE — Discharge Instructions (Signed)
We were able to help you to pass some stool here in the emergency department  Would recommend increasing your MiraLAX at home, 2 scoops in 8 ounces of liquid, repeat every hour until soft bowel movements.  Also take 2 Dulcolax tablets.  This might give you diarrhea in the beginning, stop drinking the MiraLAX after that.  Start doing 1 scoop of MiraLAX daily afterwards as you had been previously to keep your stool soft.  Follow-up outpatient, return for any worsening symptoms.

## 2023-08-03 NOTE — ED Triage Notes (Signed)
Pt c/o rectal pain since Wed; worse now; last BM was Wed; no relief with Miralax

## 2023-08-03 NOTE — ED Provider Notes (Signed)
EMERGENCY DEPARTMENT AT [facility] Provider Note   CSN: [id number] Arrival date & time: 08/03/23  1133     History  Chief Complaint  Patient presents with   Rectal Pain    Meghan Diaz is a 83 y.o. female here for evaluation of rectal pain.  States this started approximately 5 days ago.  She typically has multiple bowel movements daily.  On her last bowel movement she states she probably had about 5 small pellets of stool.  She denies any rectal bleeding.  She is passing flatus.  She states she tried to disimpact herself however was unable to reach fully around.  She was seen by her PCP on Wednesday however according to patient they did not do a physical exam and they told her to drink warm water.  She started doing 1 scoop of MiraLAX on Thursday daily without relief.  She denies any abdominal pain, fever, nausea or vomiting, bloody stool.  She denies any swelling or tenderness to the skin surrounding her rectum.  She denies any history of rectal prolapse, abscess, fissure.  HPI     Home Medications Prior to Admission medications   Medication Sig Start Date End Date Taking? Authorizing Provider  polyethylene glycol powder (GLYCOLAX/MIRALAX) 17 GM/SCOOP powder Take 255 g by mouth once for 1 dose. 08/03/23 08/03/23 Yes Alfretta Pinch A, PA-C  ALPRAZolam (XANAX) 0.5 MG tablet Take 0.5 mg by mouth at bedtime as needed for anxiety.    [provider]  aspirin 81 MG EC tablet Take 81 mg by mouth in the morning.    [provider]  buPROPion (WELLBUTRIN XL) 150 MG 24 hr tablet Take 150 mg by mouth every morning. 07/13/21   [provider]  clopidogrel (PLAVIX) 75 MG tablet Take 1 tablet (75 mg total) by mouth daily. 07/14/21   Victorino Sparrow, MD  Homeopathic Products (LEG CRAMPS SL) Place 2-3 tablets under the tongue every 4 (four) hours as needed (toe cramping).    [provider]  levothyroxine (SYNTHROID) 50 MCG tablet Take 50-75  mcg by mouth See admin instructions. Take 1 tablet (50 mcg) by mouth on Mondays through Fridays in the morning on an empty stomach. Take 1.5 tablets (75 mcg) by mouth on Sundays & Saturdays in the morning on an empty stomach.    [provider]  losartan-hydrochlorothiazide (HYZAAR) 100-25 MG tablet Take 1 tablet by mouth in the morning. 07/12/21   [provider]  rosuvastatin (CRESTOR) 10 MG tablet Take 10 mg by mouth at bedtime. 07/17/21 07/17/22  [provider]  traMADol (ULTRAM) 50 MG tablet Take 1 tablet (50 mg total) by mouth every 6 (six) hours as needed. 07/20/21   Rhyne, Ames Coupe, PA-C  Vitamin D, Ergocalciferol, (DRISDOL) 1.25 MG (50000 UNIT) CAPS capsule Take 1 capsule (50,000 Units total) by mouth every 7 (seven) days. Patient taking differently: Take 50,000 Units by mouth every Monday. 06/08/21   Wilder Glade, MD      Allergies    Codeine, Molds & smuts, and Penicillins    Review of Systems   Review of Systems  Constitutional: Negative.   HENT: Negative.    Respiratory: Negative.    Cardiovascular: Negative.   Gastrointestinal:  Positive for constipation and rectal pain. Negative for abdominal distention, abdominal pain, anal bleeding, blood in stool, diarrhea, nausea and vomiting.  Genitourinary: Negative.   Musculoskeletal: Negative.   Skin: Negative.   Neurological: Negative.   All other systems  reviewed and are negative.   Physical Exam Updated Vital Signs BP 139/76   Pulse 67   Temp 98 F (36.7 C) (Oral)   Resp 18   Ht 5\' 8"  (1.727 m)   Wt 82.1 kg   SpO2 99%   BMI 27.52 kg/m  Physical Exam Vitals and nursing note reviewed. Exam conducted with a chaperone present.  Constitutional:      General: She is not in acute distress.    Appearance: She is well-developed. She is not ill-appearing, toxic-appearing or diaphoretic.  HENT:     Head: Atraumatic.  Eyes:     Pupils: Pupils are equal, round, and reactive to light.   Cardiovascular:     Rate and Rhythm: Normal rate.     Pulses: Normal pulses.     Heart sounds: Normal heart sounds.  Pulmonary:     Effort: Pulmonary effort is normal. No respiratory distress.     Breath sounds: Normal breath sounds.  Abdominal:     General: Bowel sounds are normal. There is no distension.     Palpations: Abdomen is soft.     Tenderness: There is no abdominal tenderness. There is no right CVA tenderness, left CVA tenderness, guarding or rebound.  Genitourinary:    Comments: Significant rectal impaction. No fissure, hemorrhoid, rectal prolapse.  No rectal, perirectal erythema, warmth, fluctuance or induration. Musculoskeletal:        General: Normal range of motion.     Cervical back: Normal range of motion.  Skin:    General: Skin is warm and dry.     Capillary Refill: Capillary refill takes less than 2 seconds.  Neurological:     General: No focal deficit present.     Mental Status: She is alert.  Psychiatric:        Mood and Affect: Mood normal.    ED Results / Procedures / Treatments   Labs (all labs ordered are listed, but only abnormal results are displayed) Labs Reviewed  COMPREHENSIVE METABOLIC PANEL - Abnormal; Notable for the following components:      Result Value   Sodium 132 (*)    Potassium 3.4 (*)    Chloride 96 (*)    Glucose, Bld 104 (*)    All other components within normal limits  CBC WITH DIFFERENTIAL/PLATELET    EKG None  Radiology No results found.  Procedures Procedures    Medications Ordered in ED Medications  lidocaine (XYLOCAINE) 2 % jelly 1 Application (1 Application Topical Given 08/03/23 1500)  sodium phosphate (FLEET) enema 1 enema (1 enema Rectal Given 08/03/23 1535)    ED Course/ Medical Decision Making/ A&P   83 year old here for evaluation of rectal pain.  Began about 5 days PTA.  States she feels like she has a lot of stool in her rectum.  Tried disimpaction at home however was unable to reach.  Denies any  abdominal pain, bloody stool, nausea or vomiting.  Tried 1 scoop of MiraLAX on Thursday and Friday without relief.  No history of fissure, hemorrhoids, prolapse.  On exam patient has large stool ball with impaction.  She has no evidence of fissure, hemorrhoid, rectal prolapse, abscess, cellulitis.  Will plan on exam and reassess  Labs personally viewed and interpreted:  CBC without leukocytosis Metabolic panel sodium 132, potassium 3.4   GU exam with significant stool impaction.  Large amount of stool removed on disimpaction at bedside..  Will provide fleets enema to see if we can clear a low bit more  Patient was able to have 2 small bowel movements and bedside commode  Patient reassessed.  Repeat abdominal exam soft, nontender.  Will have her increase her MiraLAX at home.   Patient reassessed.  She is able to have another bowel movement.  She feels improved.  We discussed home management.  Will have her follow-up outpatient.  She is requesting discharge as it is getting late and she has difficulty seeing in the dark.  Will have her follow-up outpatient, return for new or worsening symptoms.  Patient is nontoxic, nonseptic appearing, in no apparent distress.  Patient's pain and other symptoms adequately managed in emergency department.  Fluid bolus given.  Labs, imaging and vitals reviewed.  Patient does not meet the SIRS or Sepsis criteria.  On repeat exam patient does not have a surgical abdomin and there are no peritoneal signs.  No indication of appendicitis, bowel obstruction, bowel perforation, cholecystitis, diverticulitis.  Patient discharged home with symptomatic treatment and given strict instructions for follow-up with their primary care physician.  I have also discussed reasons to return immediately to the ER.  Patient expresses understanding and agrees with plan.                                  Medical Decision Making Amount and/or Complexity of Data Reviewed External Data  Reviewed: labs, radiology and notes. Labs: ordered. Decision-making details documented in ED Course.  Risk OTC drugs. Prescription drug management. Decision regarding hospitalization. Diagnosis or treatment significantly limited by social determinants of health.         Final Clinical Impression(s) / ED Diagnoses Final diagnoses:  Fecal impaction [facility])    Rx / DC Orders ED Discharge Orders          Ordered    polyethylene glycol powder (GLYCOLAX/MIRALAX) 17 GM/SCOOP powder   Once        08/03/23 1616              Cloa Bushong A, PA-C 08/03/23 1617    Terald Sleeper, MD 08/04/23 [phone]

## 2023-08-03 NOTE — ED Notes (Signed)
Pt was able to pass two formed small stools in [facility]

## 2023-08-03 NOTE — ED Notes (Signed)
Pt undressed from waist down for MD eval.  Pt reports severe rectal pain since 1/22, she was seen by PCP but was not evaluated but told to drink lots of water and fiber.  Pt looks to have fecal impaction when I look at her rectum.  When asked when she had last bm she states 5-6 days ago when she had some small pellets of stool.

## 2023-08-03 NOTE — ED Notes (Signed)
Discharge instructions reviewed with patient. Patient verbalizes understanding, no further questions at this time. Medications/prescriptions and follow up information provided. No acute distress noted at time of departure.

## 2023-12-04 DIAGNOSIS — Z0289 Encounter for other administrative examinations: Secondary | ICD-10-CM

## 2024-01-01 ENCOUNTER — Encounter: Payer: Self-pay | Admitting: Family Medicine

## 2024-01-01 ENCOUNTER — Ambulatory Visit (INDEPENDENT_AMBULATORY_CARE_PROVIDER_SITE_OTHER): Admitting: Family Medicine

## 2024-01-01 VITALS — BP 158/85 | HR 55 | Temp 97.7°F | Ht 66.0 in | Wt 187.0 lb

## 2024-01-01 DIAGNOSIS — Z7289 Other problems related to lifestyle: Secondary | ICD-10-CM | POA: Diagnosis not present

## 2024-01-01 DIAGNOSIS — R5383 Other fatigue: Secondary | ICD-10-CM

## 2024-01-01 DIAGNOSIS — Z6830 Body mass index (BMI) 30.0-30.9, adult: Secondary | ICD-10-CM

## 2024-01-01 DIAGNOSIS — R948 Abnormal results of function studies of other organs and systems: Secondary | ICD-10-CM

## 2024-01-01 DIAGNOSIS — E785 Hyperlipidemia, unspecified: Secondary | ICD-10-CM

## 2024-01-01 DIAGNOSIS — E039 Hypothyroidism, unspecified: Secondary | ICD-10-CM | POA: Diagnosis not present

## 2024-01-01 DIAGNOSIS — R0602 Shortness of breath: Secondary | ICD-10-CM | POA: Diagnosis not present

## 2024-01-01 DIAGNOSIS — I10 Essential (primary) hypertension: Secondary | ICD-10-CM

## 2024-01-01 DIAGNOSIS — E66811 Obesity, class 1: Secondary | ICD-10-CM

## 2024-01-01 DIAGNOSIS — Z9189 Other specified personal risk factors, not elsewhere classified: Secondary | ICD-10-CM | POA: Insufficient documentation

## 2024-01-01 NOTE — Progress Notes (Signed)
 At a Glance:  Vitals Temp: 97.7 F (36.5 C) BP: (!) 158/85 Pulse Rate: (!) 55 SpO2: 99 %   Anthropometric Measurements Height: 5' 6 (1.676 m) Weight: 187 lb (84.8 kg) BMI (Calculated): 30.2 Starting Weight: 187lb   Body Composition  Body Fat %: 40.5 % Fat Mass (lbs): 75.8 lbs Muscle Mass (lbs): 106 lbs Total Body Water (lbs): 73.2 lbs Visceral Fat Rating : 13   Other Clinical Data RMR: 1267 Fasting: Yes Labs: Yes Today's Visit #: 1 Starting Date: 01/01/24    EKG: Normal sinus rhythm, rate 55.  Indirect Calorimeter completed today shows a VO2 of 183 and a REE of 1267.  Her calculated basal metabolic rate is 8478 thus her basal metabolic rate is worse than expected.  Chief Complaint:  Obesity   Subjective:  Meghan Diaz (MR# [id number]) is a 83 y.o. female who presents for evaluation and treatment of obesity and related comorbidities.   Meghan Diaz is currently in the action stage of change and ready to dedicate time achieving and maintaining a healthier weight. Meghan Diaz is interested in becoming our patient and working on intensive lifestyle modifications including (but not limited to) diet and exercise for weight loss.  Meghan Diaz has been struggling with her weight. She has been unsuccessful in either losing weight, maintaining weight loss, or reaching her healthy weight goal. She was last seen by Dr Verdon Dec 2022 weighing 217 lb.  She was seen over 3 years and had gained 5 lb.  Since then, she has lost 30 lb on her own.  She admits to starting to regain weight the past few months after getting back into bad eating habits with chips, sweets and increased meals out.  Meghan Diaz's habits were reviewed today and are as follows: her desired weight loss is 30 lb, she snacks frequently in the evenings, and she frequently makes poor food choices.  She has noticed more truncal adiposity.  She has not been working out as she previously was.  She plans to rejoin the gym.  She has  been sad that her son and his kids live in [place] and her siblings live out of state.  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 Meghan Diaz generally gets 7 hours of sleep per night, and states that she has generally restful sleep. Snoring is not present. Apneic episodes are not present. Epworth Sleepiness Score is 2.   Shortness of Breath Meghan Diaz notes increasing shortness of breath with exercising and seems to be worsening over time with weight gain. She notes getting out of breath sooner with activity than she used to. This has gotten worse recently. [name] denies shortness of breath at rest or orthopnea.   Depression Screen [name]'s Food and Mood (modified PHQ-9) score was 6.     08/12/2018    8:00 AM  Depression screen PHQ 2/9  Decreased Interest 0  Down, Depressed, Hopeless 0  PHQ - 2 Score 0  Altered sleeping 1  Tired, decreased energy 0  Change in appetite 0  Feeling bad or failure about yourself  0  Trouble concentrating 0  Moving slowly or fidgety/restless 0  Suicidal thoughts 0  PHQ-9 Score 1     Assessment and Plan:   Other Fatigue [name] does feel that her weight is causing her energy to be lower than it should be. Fatigue may be related to obesity, depression or many  other causes. Labs will be ordered, and in the meanwhile, Meghan Diaz will focus on self care including making healthy food choices, increasing physical activity and focusing on stress reduction.  Shortness of Breath Meghan Diaz does feel that she gets out of breath more easily that she used to when she exercises. Meghan Diaz's shortness of breath appears to be obesity related and exercise induced. She has agreed to work on weight loss and gradually increase exercise to treat her exercise induced shortness of breath. Will continue to monitor closely.   Problem List Items Addressed This Visit     HTN (hypertension),  benign BP is elevated.  She does not monitor at home.  She is taking Losartan / hydrochlorothiazide 100-25 mg daily and metoprolol XL 25 mg daily Denies CP or DOE Continue current meds Avoid stimulants to treat obesity   Relevant Medications   metoprolol succinate (TOPROL-XL) 25 MG 24 hr tablet   Hypothyroid Lab Results  Component Value Date   TSH 3.120 11/28/2020   On Levothyroxine  50 mcg M-F and 75 mcg on weekends Update labs today   Relevant Medications   metoprolol succinate (TOPROL-XL) 25 MG 24 hr tablet   Sedentary lifestyle Sedentary lifestyle over the past 2 years has likely contributed to weight regain, abdominal adiposity, loss of lean body mass and a lower metabolic rate.  She is motivated to get back to the gym. We discussed options including benefits of the social aspect of group exercise, in the importance of both cardio and resistance training exercise to reduce fall risk   Other Visit Diagnoses       SOBOE (shortness of breath on exertion)    -  Primary     Other fatigue       Relevant Orders   EKG 12-Lead (Completed)   VITAMIN D  25 Hydroxy (Vit-D Deficiency, Fractures)   TSH   T4, free   T3   Insulin , random   Hemoglobin A1c   Folate   Comprehensive metabolic panel with GFR   Vitamin B12   CBC with Differential/Platelet     Hyperlipidemia, unspecified hyperlipidemia type       Relevant Medications   metoprolol succinate (TOPROL-XL) 25 MG 24 hr tablet   Other Relevant Orders   Lipid panel     Class 1 obesity due to excess calories with body mass index (BMI) of 30.0 to 30.9 in adult, unspecified whether serious comorbidity present         Low basal metabolic rate     Reviewed fasting IC Keep cal intake ~1200 cal/ day Improve exercise frequency       Meghan Diaz is currently in the action stage of change and her goal is to get back to weightloss efforts . I recommend Meghan Diaz begin the structured treatment plan as follows:  She has agreed to a Mediterranean  Style diet (~1200 cal/ day), handout provided on AVS  Exercise goals: Older adults should determine their level of effort for physical activity relative to their level of fitness. Reviewed gym options/ classes/ [employer] (closer to home) vs [facility] gym; she did not like the [employer] modification strategies:increasing lean protein intake, increase H2O intake, decrease liquid calories, decrease ETOH, increase high fiber foods, meal planning and cooking strategies, keeping healthy foods in the home, better snacking choices, and avoiding temptations  She was informed of the importance of frequent follow-up visits to maximize her success with intensive lifestyle modifications for her multiple health conditions. She was informed we would discuss her  lab results at her next visit unless there is a critical issue that needs to be addressed sooner. Stella agreed to keep her next visit at the agreed upon time to discuss these results.  Objective:  General: Cooperative, alert, well developed, in no acute distress. HEENT: Conjunctivae and lids unremarkable. Cardiovascular: Regular rhythm.  Lungs: Normal work of breathing. Neurologic: No focal deficits.   Lab Results  Component Value Date   CREATININE 0.80 08/03/2023   BUN 19 08/03/2023   NA 132 (L) 08/03/2023   K 3.4 (L) 08/03/2023   CL 96 (L) 08/03/2023   CO2 23 08/03/2023   Lab Results  Component Value Date   ALT 15 08/03/2023   AST 23 08/03/2023   ALKPHOS 59 08/03/2023   BILITOT 0.7 08/03/2023   Lab Results  Component Value Date   HGBA1C 5.5 02/05/2019   HGBA1C 5.5 08/12/2018   HGBA1C 5.6 03/20/2018   Lab Results  Component Value Date   INSULIN  6.8 02/05/2019   INSULIN  17.5 08/12/2018   INSULIN  10.4 03/20/2018   Lab Results  Component Value Date   TSH 3.120 11/28/2020   Lab Results  Component Value Date   CHOL 246 (H) 12/10/2019   HDL 99 12/10/2019   LDLCALC 136 (H) 12/10/2019   TRIG 69 12/10/2019   Lab  Results  Component Value Date   WBC 7.5 08/03/2023   HGB 13.6 08/03/2023   HCT 39.9 08/03/2023   MCV 94.1 08/03/2023   PLT 208 08/03/2023   No results found for: IRON, TIBC, FERRITIN  Attestation Statements:  Reviewed by clinician on day of visit: allergies, medications, problem list, medical history, surgical history, family history, social history, and previous encounter notes.  Time spent on visit including pre-visit chart review and post-visit charting and face- to face care including nutritional counseling, review of EKG, interpretation of body composition scale and indirect calorimetry and nutrition prescription  was 44 minutes.   Darice Haddock, D.O. DABFM, DABOM [facility] [address] [place], [place] [address] [phone]

## 2024-01-01 NOTE — Patient Instructions (Addendum)
 You are welcome to track daily calories using the Lose It App to track (optional)  Aim for 1200 cal/ day  Barilla Protein Pasta Nutritional Yeast Greek Yogurt makes a good snacks...  pick one with < 8 grams of sugar per serving Remember your fruits and veggies  Limit meals out to 1-2 per week  Check out gym options Aim for gym workouts 3 days/ wk  Drink 64 oz of water daily

## 2024-01-02 ENCOUNTER — Ambulatory Visit: Payer: Self-pay | Admitting: Family Medicine

## 2024-01-02 LAB — CBC WITH DIFFERENTIAL/PLATELET
Basophils Absolute: 0 10*3/uL (ref 0.0–0.2)
Basos: 1 %
EOS (ABSOLUTE): 0.1 10*3/uL (ref 0.0–0.4)
Eos: 1 %
Hematocrit: 42.9 % (ref 34.0–46.6)
Hemoglobin: 13.4 g/dL (ref 11.1–15.9)
Immature Grans (Abs): 0 10*3/uL (ref 0.0–0.1)
Immature Granulocytes: 0 %
Lymphocytes Absolute: 1.2 10*3/uL (ref 0.7–3.1)
Lymphs: 17 %
MCH: 31.8 pg (ref 26.6–33.0)
MCHC: 31.2 g/dL — ABNORMAL LOW (ref 31.5–35.7)
MCV: 102 fL — ABNORMAL HIGH (ref 79–97)
Monocytes Absolute: 0.6 10*3/uL (ref 0.1–0.9)
Monocytes: 8 %
Neutrophils Absolute: 4.8 10*3/uL (ref 1.4–7.0)
Neutrophils: 72 %
Platelets: 238 10*3/uL (ref 150–450)
RBC: 4.22 x10E6/uL (ref 3.77–5.28)
RDW: 13.1 % (ref 11.7–15.4)
WBC: 6.6 10*3/uL (ref 3.4–10.8)

## 2024-01-02 LAB — COMPREHENSIVE METABOLIC PANEL WITH GFR
ALT: 12 IU/L (ref 0–32)
AST: 18 IU/L (ref 0–40)
Albumin: 4.4 g/dL (ref 3.8–4.8)
Alkaline Phosphatase: 101 IU/L (ref 44–121)
BUN/Creatinine Ratio: 21 (ref 12–28)
BUN: 21 mg/dL (ref 8–27)
Bilirubin Total: 0.2 mg/dL (ref 0.0–1.2)
CO2: 24 mmol/L (ref 20–29)
Calcium: 10 mg/dL (ref 8.7–10.3)
Chloride: 99 mmol/L (ref 96–106)
Creatinine, Ser: 0.99 mg/dL (ref 0.57–1.00)
Globulin, Total: 2.2 g/dL (ref 1.5–4.5)
Glucose: 89 mg/dL (ref 70–99)
Potassium: 5 mmol/L (ref 3.5–5.2)
Sodium: 140 mmol/L (ref 134–144)
Total Protein: 6.6 g/dL (ref 6.0–8.5)
eGFR: 58 mL/min/{1.73_m2} — ABNORMAL LOW (ref >59)

## 2024-01-02 LAB — HEMOGLOBIN A1C
Est. average glucose Bld gHb Est-mCnc: 114 mg/dL
Hgb A1c MFr Bld: 5.6 % (ref 4.8–5.6)

## 2024-01-02 LAB — FOLATE: Folate: 10 ng/mL (ref >3.0)

## 2024-01-02 LAB — TSH: TSH: 2.3 u[IU]/mL (ref 0.450–4.500)

## 2024-01-02 LAB — LIPID PANEL
Chol/HDL Ratio: 2.9 ratio (ref 0.0–4.4)
Cholesterol, Total: 229 mg/dL — ABNORMAL HIGH (ref 100–199)
HDL: 79 mg/dL (ref >39)
LDL Chol Calc (NIH): 131 mg/dL — ABNORMAL HIGH (ref 0–99)
Triglycerides: 109 mg/dL (ref 0–149)
VLDL Cholesterol Cal: 19 mg/dL (ref 5–40)

## 2024-01-02 LAB — T4, FREE: Free T4: 1.25 ng/dL (ref 0.82–1.77)

## 2024-01-02 LAB — T3: T3, Total: 76 ng/dL (ref 71–180)

## 2024-01-02 LAB — INSULIN, RANDOM: INSULIN: 9.1 u[IU]/mL (ref 2.6–24.9)

## 2024-01-02 LAB — VITAMIN B12: Vitamin B-12: 310 pg/mL (ref 232–1245)

## 2024-01-02 LAB — VITAMIN D 25 HYDROXY (VIT D DEFICIENCY, FRACTURES): Vit D, 25-Hydroxy: 38.3 ng/mL (ref 30.0–100.0)

## 2024-01-15 ENCOUNTER — Encounter: Payer: Self-pay | Admitting: Family Medicine

## 2024-01-15 ENCOUNTER — Ambulatory Visit (INDEPENDENT_AMBULATORY_CARE_PROVIDER_SITE_OTHER): Admitting: Family Medicine

## 2024-01-15 VITALS — BP 148/83 | HR 49 | Temp 97.7°F | Ht 66.0 in | Wt 183.0 lb

## 2024-01-15 DIAGNOSIS — I10 Essential (primary) hypertension: Secondary | ICD-10-CM

## 2024-01-15 DIAGNOSIS — R7989 Other specified abnormal findings of blood chemistry: Secondary | ICD-10-CM

## 2024-01-15 DIAGNOSIS — E663 Overweight: Secondary | ICD-10-CM | POA: Diagnosis not present

## 2024-01-15 DIAGNOSIS — E7800 Pure hypercholesterolemia, unspecified: Secondary | ICD-10-CM | POA: Diagnosis not present

## 2024-01-15 DIAGNOSIS — Z6829 Body mass index (BMI) 29.0-29.9, adult: Secondary | ICD-10-CM

## 2024-01-15 NOTE — Progress Notes (Signed)
 Office: [phone]  /  Fax: [phone]  WEIGHT SUMMARY AND BIOMETRICS  Starting Date: 01/01/24  Starting Weight: 187lb   Weight Lost Since Last Visit: 4lb   Vitals Temp: 97.7 F (36.5 C) BP: (!) 148/83 Pulse Rate: (!) 49 SpO2: 100 %   Body Composition  Body Fat %: 39.9 % Fat Mass (lbs): 73.2 lbs Muscle Mass (lbs): 104.6 lbs Total Body Water (lbs): 70 lbs Visceral Fat Rating : 12    HPI  Chief Complaint: OBESITY  Meghan Diaz is here to discuss her progress with her obesity treatment plan. She is on the [place] plan and states she is following her eating plan approximately 75 % of the time. She states she is walking and starting a few weights 3 days per week.   Interval History:  Since last office visit she is down 4 lb She is back at [employer] - adding in walking and weights She goes 3 days/ wk and hopes to get up to 5 days/ wk She has been eating a lot of greens, fish, salads, grilled chicken, hard boiled eggs She is eating out about once a day She is getting in a high fiber cereal with blueberries She is working on water intake, unsweet tea and some coffee with milk She avoids cheese  Pharmacotherapy: none  PHYSICAL EXAM:  Blood pressure (!) 148/83, pulse (!) 49, temperature 97.7 F (36.5 C), height 5' 6 (1.676 m), weight 183 lb (83 kg), SpO2 100%. Body mass index is 29.54 kg/m.  General: She is healthy appearing, cooperative, alert, well developed, and in no acute distress. PSYCH: Has normal mood, affect and thought process.   Lungs: Normal breathing effort, no conversational dyspnea.   ASSESSMENT AND PLAN  TREATMENT PLAN FOR OBESITY:  Recommended Dietary Goals  Meghan Diaz is currently in the action stage of change. As such, her goal is to continue weight management plan. She has agreed to keeping a food journal and adhering to recommended goals of 1200 calories and 80 g of protein. - continue Mediterranean style  diet  Behavioral Intervention  We discussed the following Behavioral Modification Strategies today: increasing vegetables, increasing fiber rich foods, increasing water intake , work on meal planning and preparation, work on tracking and journaling calories using tracking application, reading food labels , keeping healthy foods at home, avoiding temptations and identifying enticing environmental cues, and continue to work on maintaining a reduced calorie state, getting the recommended amount of protein, incorporating whole foods, making healthy choices, staying well hydrated and practicing mindfulness when eating..  Additional resources provided today: NA  Recommended Physical Activity Goals  Meghan Diaz has been advised to work up to 150 minutes of moderate intensity aerobic activity a week and strengthening exercises 2-3 times per week for cardiovascular health, weight loss maintenance and preservation of muscle mass.   She has agreed to Increase the intensity, frequency or duration of strengthening exercises  and Increase the intensity, frequency or duration of aerobic exercises   Aim for gym workouts 5 days/ wk with a mix of weight training, walking, bike, classes  Pharmacotherapy changes for the treatment of obesity: none  ASSOCIATED CONDITIONS ADDRESSED TODAY  Low vitamin B12 level Lab Results  Component Value Date   VITAMINB12 310 01/01/2024  Reviewed lab findings with patient.  She has stopped her B12 supplement.  She does have some complaints of fatigue but denies paresthesias or brain fog.  She does not eat beef.  Recommend starting a vitamin B12 500 mcg once daily  supplement.  Overweight (BMI 25.0-29.9) Reviewed results of bioimpedance.  Her BMI is already under 30. Aim for a body fat percent under 35 and a visceral fat rating under 10 Continue on a 1200-calorie Mediterranean style diet Along with yogurt for protein and calcium instead of skipping lunch.  May combine with  fruits  BMI 29.0-29.9,adult  Pure hypercholesterolemia Lab Results  Component Value Date   CHOL 229 (H) 01/01/2024   HDL 79 01/01/2024   LDLCALC 131 (H) 01/01/2024   TRIG 109 01/01/2024   CHOLHDL 2.9 01/01/2024   Reviewed labs with patient from last visit.  She is on rosuvastatin 10 mg daily but cannot recall if she is actually taking this.  Recommend follow-up with her PCP at [facility].  Primary hypertension Her blood pressure is mildly elevated today.  She is on losartan /HCTZ 100/25 mg once daily and metoprolol XL 25 mg once daily which is also likely the cause of her mild bradycardia.  She cannot recall her compliance taking her blood pressure medication and has some problems with anxiety.  Continue all listed antihypertensive medication as prescribed and follow-up with PCP for further management.  Limit caffeine intake   She was informed of the importance of frequent follow up visits to maximize her success with intensive lifestyle modifications for her multiple health conditions.   ATTESTASTION STATEMENTS:  Reviewed by clinician on day of visit: allergies, medications, problem list, medical history, surgical history, family history, social history, and previous encounter notes pertinent to obesity diagnosis.   I have personally spent 31 minutes total time today in preparation, patient care, nutritional counseling and education,  and documentation for this visit, including the following: review of most recent clinical lab tests, reviewing medical assistant documentation, review and interpretation of bioimpedence results.     Meghan Diaz, D.O. DABFM, DABOM [facility] [address] [place], [place] [address] [phone]

## 2024-01-16 ENCOUNTER — Ambulatory Visit: Admitting: Family Medicine

## 2024-01-30 ENCOUNTER — Encounter: Payer: Self-pay | Admitting: Bariatrics

## 2024-01-30 ENCOUNTER — Ambulatory Visit (INDEPENDENT_AMBULATORY_CARE_PROVIDER_SITE_OTHER): Admitting: Bariatrics

## 2024-01-30 VITALS — BP 137/52 | HR 48 | Temp 97.8°F | Ht 66.0 in | Wt 185.0 lb

## 2024-01-30 DIAGNOSIS — E663 Overweight: Secondary | ICD-10-CM

## 2024-01-30 DIAGNOSIS — Z6829 Body mass index (BMI) 29.0-29.9, adult: Secondary | ICD-10-CM

## 2024-01-30 DIAGNOSIS — E7800 Pure hypercholesterolemia, unspecified: Secondary | ICD-10-CM

## 2024-01-30 NOTE — Progress Notes (Signed)
 WEIGHT SUMMARY AND BIOMETRICS  Weight Lost Since Last Visit: 0  Weight Gained Since Last Visit: 2lb   Vitals Temp: 97.8 F (36.6 C) BP: (!) 137/52 Pulse Rate: (!) 48 SpO2: 100 %   Anthropometric Measurements Height: 5' 6 (1.676 m) Weight: 185 lb (83.9 kg) BMI (Calculated): 29.87 Weight at Last Visit: 183lb Weight Lost Since Last Visit: 0 Weight Gained Since Last Visit: 2lb Starting Weight: 187lb Total Weight Loss (lbs): 2 lb (0.907 kg)   Body Composition  Body Fat %: 40 % Fat Mass (lbs): 74 lbs Muscle Mass (lbs): 105.4 lbs Total Body Water (lbs): 70.2 lbs Visceral Fat Rating : 12   Other Clinical Data Fasting: no Labs: no Today's Visit #: 3 Starting Date: 01/01/24    OBESITY Sarahanne is here to discuss her progress with her obesity treatment plan along with follow-up of her obesity related diagnoses.    Nutrition Plan: Other Mediterranean  - 90% adherence.  Current exercise: Goes to the gym for exercise.  Interim History:  She is up 2 lbs since her last visit. Her bioimpedance showed that her muscle improvement and her water up slightly.  Eating all of the food on the plan., Protein intake is as prescribed, Is skipping meals, Not journaling consistently., Water intake is adequate., Denies polyphagia, and Reports excessive cravings.  Hunger is moderately controlled.  Cravings are poorly controlled.  Assessment/Plan:   Pure hypercholesterolemia:   LDL is not at goal. Medication(s): Crestor  Cardiovascular risk factors: advanced age (older than 21 for men, 34 for women), dyslipidemia, hypertension, obesity (BMI >= 30 kg/m2), and sedentary lifestyle  Lab Results  Component Value Date   CHOL 229 (H) 01/01/2024   HDL 79 01/01/2024   LDLCALC 131 (H) 01/01/2024   TRIG 109 01/01/2024   CHOLHDL 2.9 01/01/2024   Lab Results  Component Value  Date   ALT 12 01/01/2024   AST 18 01/01/2024   ALKPHOS 101 01/01/2024   BILITOT 0.2 01/01/2024   The ASCVD Risk score (Arnett DK, et al., 2019) failed to calculate for the following reasons:   The 2019 ASCVD risk score is only valid for ages 19 to 38   Risk score cannot be calculated because patient has a medical history suggesting prior/existing ASCVD  Plan:  Continue statin.  Will avoid all trans fats.  Will read labels Will minimize saturated fats except the following: low fat meats in moderation, diary, and limited dark chocolate.  Increase Omega 3 in foods, and begin  an Omega 3 supplement.  Will start journaling Will add protein to her salad.  Will try a protein shake. Will try the recumbent bike.  Do not skip meals. .   Generalized Obesity: Current BMI BMI (Calculated): 29.87   Pharmacotherapy Plan No medications at this time.   Yeraldi is currently in the action stage of change. As such, her  goal is to continue with weight loss efforts.  She has agreed to Other Mediterranean and around 1,200 calories.  .  Exercise goals: Older adults should determine their level of effort for physical activity relative to their level of fitness.   Behavioral modification strategies: increasing lean protein intake, no meal skipping, meal planning , increase water intake, better snacking choices, planning for success, decrease snacking , keep healthy foods in the home, weigh protein portions, measure portion sizes, and work on smaller portions.  Ivanell has agreed to follow-up with our clinic in 2 weeks.     Objective:   VITALS: Per patient if applicable, see vitals. GENERAL: Alert and in no acute distress. CARDIOPULMONARY: No increased WOB. Speaking in clear sentences.  PSYCH: Pleasant and cooperative. Speech normal rate and rhythm. Affect is appropriate. Insight and judgement are appropriate. Attention is focused, linear, and appropriate.  NEURO: Oriented as arrived to appointment on  time with no prompting.   Attestation Statements:   This was prepared with the assistance of Engineer, civil (consulting).  Occasional wrong-word or sound-a-like substitutions may have occurred due to the inherent limitations of voice recognition   Clayborne Daring, DO

## 2024-02-18 ENCOUNTER — Encounter: Payer: Self-pay | Admitting: Bariatrics

## 2024-02-18 ENCOUNTER — Ambulatory Visit: Admitting: Bariatrics

## 2024-02-18 VITALS — BP 146/80 | HR 52 | Ht 66.0 in | Wt 186.0 lb

## 2024-02-18 DIAGNOSIS — Z6830 Body mass index (BMI) 30.0-30.9, adult: Secondary | ICD-10-CM

## 2024-02-18 DIAGNOSIS — E669 Obesity, unspecified: Secondary | ICD-10-CM | POA: Diagnosis not present

## 2024-02-18 DIAGNOSIS — I10 Essential (primary) hypertension: Secondary | ICD-10-CM

## 2024-02-18 NOTE — Progress Notes (Signed)
 WEIGHT SUMMARY AND BIOMETRICS  Weight Lost Since Last Visit: 0  Weight Gained Since Last Visit: 1lb   Vitals BP: (!) 146/80 Pulse Rate: (!) 52 SpO2: 99 %   Anthropometric Measurements Height: 5' 6 (1.676 m) Weight: 186 lb (84.4 kg) BMI (Calculated): 30.04 Weight at Last Visit: 185lb Weight Lost Since Last Visit: 0 Weight Gained Since Last Visit: 1lb Starting Weight: 187lb Total Weight Loss (lbs): 1 lb (0.454 kg)   Body Composition  Body Fat %: 41.4 % Fat Mass (lbs): 77.2 lbs Muscle Mass (lbs): 103.8 lbs Total Body Water (lbs): 70 lbs Visceral Fat Rating : 13   Other Clinical Data Fasting: no Labs: no Today's Visit #: 4 Starting Date: 01/01/24    OBESITY Tephanie is here to discuss her progress with her obesity treatment plan along with follow-up of her obesity related diagnoses.    Nutrition Plan: Other Mediterranean - 0% adherence.  Current exercise: none  Interim History:  She is up 1 lb since her last visit. The bio-impedence machine shows that her body fat is up slightly, and that her muscle mass went down. Her total body water is stable. She has been more social and going out to eat more.  Eating all of the food on the plan., Protein intake is as prescribed, Not journaling consistently., and Water intake is adequate.  Hunger is moderately controlled.  Cravings are moderately controlled.  Assessment/Plan:   Hypertension Hypertension control uncertain.  Medication(s): Hyzaar 100/25 mg daily and metoprolol XL 25 mg daily    BP Readings from Last 3 Encounters:  02/18/24 (!) 146/80  01/30/24 (!) 137/52  01/15/24 (!) 148/83   Lab Results  Component Value Date   CREATININE 0.99 01/01/2024   CREATININE 0.80 08/03/2023   CREATININE 0.90 07/20/2021   No results found for: GFR  Plan: Continue all antihypertensives at current  dosages. No added salt. Will keep sodium content to 1,500 mg or less per day.     Generalized Obesity: Current BMI BMI (Calculated): 30.04    Edrie is currently in the action stage of change. As such, her goal is to continue with weight loss efforts.  She has agreed to Other Mediterranean . Information on emotional triggers.   Exercise goals: Older adults should follow the adult guidelines. When older adults cannot meet the adult guidelines, they should be as physically active as their abilities and conditions will allow.   Behavioral modification strategies: increasing lean protein intake, meal planning , increase water intake, better snacking choices, planning for success, increasing vegetables, keep healthy foods in the home, and mindful eating.  Lynnlee has agreed to follow-up with our clinic in 3 to 4 weeks.   Objective:   VITALS: Per patient if applicable, see vitals. GENERAL: Alert and in no acute distress. CARDIOPULMONARY: No increased WOB. Speaking in clear sentences.  PSYCH: Pleasant and cooperative. Speech  normal rate and rhythm. Affect is appropriate. Insight and judgement are appropriate. Attention is focused, linear, and appropriate.  NEURO: Oriented as arrived to appointment on time with no prompting.   Attestation Statements:   This was prepared with the assistance of Engineer, civil (consulting).  Occasional wrong-word or sound-a-like substitutions may have occurred due to the inherent limitations of voice recognition   Clayborne Daring, DO

## 2024-03-04 ENCOUNTER — Ambulatory Visit: Admitting: Bariatrics

## 2024-03-05 ENCOUNTER — Ambulatory Visit (INDEPENDENT_AMBULATORY_CARE_PROVIDER_SITE_OTHER): Admitting: Bariatrics

## 2024-03-05 ENCOUNTER — Encounter: Payer: Self-pay | Admitting: Bariatrics

## 2024-03-05 VITALS — BP 161/79 | HR 49 | Temp 97.6°F | Ht 66.0 in | Wt 187.0 lb

## 2024-03-05 DIAGNOSIS — E039 Hypothyroidism, unspecified: Secondary | ICD-10-CM

## 2024-03-05 DIAGNOSIS — E669 Obesity, unspecified: Secondary | ICD-10-CM | POA: Diagnosis not present

## 2024-03-05 DIAGNOSIS — Z6830 Body mass index (BMI) 30.0-30.9, adult: Secondary | ICD-10-CM | POA: Diagnosis not present

## 2024-03-05 DIAGNOSIS — E66811 Obesity, class 1: Secondary | ICD-10-CM

## 2024-03-05 NOTE — Progress Notes (Signed)
 WEIGHT SUMMARY AND BIOMETRICS  Weight Lost Since Last Visit: 0  Weight Gained Since Last Visit: 1lb   Vitals Temp: 97.6 F (36.4 C) BP: (!) 161/79 Pulse Rate: (!) 49 SpO2: 99 %   Anthropometric Measurements Height: 5' 6 (1.676 m) Weight: 187 lb (84.8 kg) BMI (Calculated): 30.2 Weight at Last Visit: 186lb Weight Lost Since Last Visit: 0 Weight Gained Since Last Visit: 1lb Starting Weight: 187lb Total Weight Loss (lbs): 0 lb (0 kg)   Body Composition  Body Fat %: 41 % Fat Mass (lbs): 76.8 lbs Muscle Mass (lbs): 104.8 lbs Total Body Water (lbs): 71.2 lbs Visceral Fat Rating : 13   Other Clinical Data Fasting: no Labs: no Today's Visit #: 5 Starting Date: 01/01/24    OBESITY Meghan Diaz is here to discuss her progress with her obesity treatment plan along with follow-up of her obesity related diagnoses.    Nutrition Plan: Other Mediterranean - 65-70% adherence.  Current exercise: walking and weightlifting  Interim History:  She is up 1 lb since her last visit.  She is gaining insight into her eating behaviors.  Not eating all of the food on the plan., Protein intake is less than prescribed., Is exceeding snack calorie allotment, Is skipping meals, Not journaling consistently., Water intake is adequate., and Reports excessive cravings.  Hunger is moderately controlled.  Cravings are poorly controlled.  Assessment/Plan:   Hypothyroidism Stable.  Does not report symptoms associated with uncontrolled hypothyroidism. Medication(s): Levothyroxine  50 mcg daily. Lab Results  Component Value Date   TSH 2.300 01/01/2024    Plan: Continue levothyroxine  at current dose. Counseling: The correct way to take levothyroxine  is fasting, with water, separated by at least 30 minutes from breakfast, and separated by more than 4 hours from calcium, iron,  multivitamins, acid reflux medications (PPIs).    Generalized Obesity: Current BMI BMI (Calculated): 30.2 She will have a protein shake at lunch if she does not eat her lunch. We discussed options for nighttime snacking.  She will continue to do her stretching and some balance exercises that we reviewed  Pharmacotherapy Plan No anti-obesity medications.   Meghan Diaz is currently in the action stage of change. As such, her goal is to continue with weight loss efforts.  She has agreed to Other Mediterranean .  Exercise goals: Older adults should determine their level of effort for physical activity relative to their level of fitness.   Behavioral modification strategies: increasing lean protein intake, no meal skipping, meal planning , better snacking choices, planning for success, avoiding temptations, keep healthy foods in the home, weigh protein portions, and measure portion sizes.  Meghan Diaz has agreed to follow-up with our clinic in 4 weeks.   Objective:   VITALS: Per patient if applicable, see vitals. GENERAL: Alert and in no acute distress. CARDIOPULMONARY: No increased WOB. Speaking in clear sentences.  PSYCH: Pleasant and cooperative.  Speech normal rate and rhythm. Affect is appropriate. Insight and judgement are appropriate. Attention is focused, linear, and appropriate.  NEURO: Oriented as arrived to appointment on time with no prompting.   Attestation Statements:   This was prepared with the assistance of Engineer, civil (consulting).  Occasional wrong-word or sound-a-like substitutions may have occurred due to the inherent limitations of voice recognition   Clayborne Daring, DO

## 2024-03-19 ENCOUNTER — Encounter: Payer: Self-pay | Admitting: Family Medicine

## 2024-03-19 ENCOUNTER — Ambulatory Visit: Admitting: Family Medicine

## 2024-03-19 VITALS — BP 154/82 | HR 52 | Temp 98.0°F | Ht 66.0 in | Wt 188.0 lb

## 2024-03-19 DIAGNOSIS — Z9189 Other specified personal risk factors, not elsewhere classified: Secondary | ICD-10-CM

## 2024-03-19 DIAGNOSIS — I10 Essential (primary) hypertension: Secondary | ICD-10-CM | POA: Diagnosis not present

## 2024-03-19 DIAGNOSIS — Z6830 Body mass index (BMI) 30.0-30.9, adult: Secondary | ICD-10-CM | POA: Diagnosis not present

## 2024-03-19 DIAGNOSIS — E66811 Obesity, class 1: Secondary | ICD-10-CM | POA: Diagnosis not present

## 2024-03-19 DIAGNOSIS — Z7289 Other problems related to lifestyle: Secondary | ICD-10-CM | POA: Diagnosis not present

## 2024-03-19 NOTE — Progress Notes (Signed)
 Office: [phone]  /  Fax: [phone]  WEIGHT SUMMARY AND BIOMETRICS  Starting Date: 01/01/24  Starting Weight: 187lb   Weight Lost Since Last Visit: 0lb   Vitals Temp: 98 F (36.7 C) BP: (!) 154/82 Pulse Rate: (!) 52 SpO2: 99 %   Body Composition  Body Fat %: 41.8 % Fat Mass (lbs): 78.8 lbs Muscle Mass (lbs): 104 lbs Total Body Water (lbs): 73.2 lbs Visceral Fat Rating : 13    HPI  Chief Complaint: OBESITY  Meghan Diaz is here to discuss her progress with her obesity treatment plan. She is on the Mediterranean and states she is following her eating plan approximately 90 % of the time. She states she is exercising 30-45 minutes 7 times per week.  Interval History:  Since last office visit she is up 1 lb She is down 0.8 lb of muscle mass and up 2 lb of body fat since last visit This gives her a net weight gain of 1 lb in the past 2 mos of medically supervised weight management She is enjoying a big breakfast w/ her friends on Sundays (grits, tenderloin, toast, eggs on buffet) She hasn't been adjusting her snacks as recommended by Dr Delores and admits to over - consuming sweets with some emotional eating She did add in walking on the treadmill at [employer] for 3.5 miles (75 min) 5 days/ wk and is doing some stretching.  She feels good after walking  Pharmacotherapy: none  PHYSICAL EXAM:  Blood pressure (!) 154/82, pulse (!) 52, temperature 98 F (36.7 C), height 5' 6 (1.676 m), weight 188 lb (85.3 kg), SpO2 99%. Body mass index is 30.34 kg/m.  General: She is healthy appearing,  cooperative, alert, well developed, and in no acute distress. PSYCH: Has normal mood, affect and thought process.   Lungs: Normal breathing effort, no conversational dyspnea.  ASSESSMENT AND PLAN  TREATMENT PLAN FOR OBESITY:  Recommended Dietary Goals  Meghan Diaz is currently in the action stage of change. As such, her goal is to continue weight management plan. She  has agreed to practicing portion control and making smarter food choices, such as increasing vegetables and decreasing simple carbohydrates.  Behavioral Intervention  We discussed the following Behavioral Modification Strategies today: increasing lean protein intake to established goals, increasing vegetables, increasing fiber rich foods, increasing water intake , keeping healthy foods at home, avoiding temptations and identifying enticing environmental cues, and continue to practice mindfulness when eating.  Additional resources provided today: NA  Recommended Physical Activity Goals  Meghan Diaz has been advised to work up to 150 minutes of moderate intensity aerobic activity a week and strengthening exercises 2-3 times per week for cardiovascular health, weight loss maintenance and preservation of muscle mass.   She has agreed to Increase the intensity, frequency or duration of aerobic exercises   Continue walking 60-75 min 5 days/ wk Add in use of body weight training or resistance bands from home 2 days/ wk  Pharmacotherapy changes for the treatment of obesity: none  ASSOCIATED CONDITIONS ADDRESSED TODAY  Class 1 obesity due to excess calories with body mass index (BMI) of 30.0 to 30.9 in adult, unspecified whether serious comorbidity present We discussed what a healthy weight goal should be given her age of 29, aiming for a BMI 27-30.  She is enjoying some social eating and may continue as this gives her happiness with socializing.  She has added in regular walking which is excellent.    She is willing to cut  back on some of her sweet treats at home Recommend limiting high sugar foods and drinks at home due to risk of over- consuming Be sure to get in plenty of vegetables and 2-3 fresh fruit servings daily  Primary hypertension BP is elevated today She is on Losartan  hydrochlorothiazide 100/25 mg daily, Metoprolol XL 25 mg daily.  She denies HA or CP Continue current meds and will  continue to monitor  Sedentary lifestyle Improved with ramping up walking time- continue     She was informed of the importance of frequent follow up visits to maximize her success with intensive lifestyle modifications for her multiple health conditions.   ATTESTASTION STATEMENTS:  Reviewed by clinician on day of visit: allergies, medications, problem list, medical history, surgical history, family history, social history, and previous encounter notes pertinent to obesity diagnosis.   I have personally spent 25 minutes total time today in preparation, patient care, nutritional counseling and education,  and documentation for this visit, including the following: review of most recent clinical lab tests,  reviewing medical assistant documentation, review and interpretation of bioimpedence results.     Darice Haddock, D.O. DABFM, DABOM [facility] [address] [place], [place] [address] [phone]

## 2024-04-06 ENCOUNTER — Ambulatory Visit: Admitting: Bariatrics

## 2024-04-06 ENCOUNTER — Encounter: Payer: Self-pay | Admitting: Bariatrics

## 2024-04-06 VITALS — BP 148/63 | HR 57 | Temp 97.8°F | Ht 66.0 in | Wt 185.0 lb

## 2024-04-06 DIAGNOSIS — Z6829 Body mass index (BMI) 29.0-29.9, adult: Secondary | ICD-10-CM | POA: Diagnosis not present

## 2024-04-06 DIAGNOSIS — E7800 Pure hypercholesterolemia, unspecified: Secondary | ICD-10-CM | POA: Diagnosis not present

## 2024-04-06 DIAGNOSIS — E669 Obesity, unspecified: Secondary | ICD-10-CM

## 2024-04-06 NOTE — Progress Notes (Signed)
 WEIGHT SUMMARY AND BIOMETRICS  Weight Lost Since Last Visit: 3lb  Weight Gained Since Last Visit: 0   Vitals Temp: 97.8 F (36.6 C) BP: (!) 148/77 Pulse Rate: (!) 57 SpO2: 98 %   Anthropometric Measurements Height: 5' 6 (1.676 m) Weight: 185 lb (83.9 kg) BMI (Calculated): 29.87 Weight at Last Visit: 188lb Weight Lost Since Last Visit: 3lb Weight Gained Since Last Visit: 0 Starting Weight: 187lb Total Weight Loss (lbs): 2 lb (0.907 kg)   Body Composition  Body Fat %: 39.8 % Fat Mass (lbs): 73.8 lbs Muscle Mass (lbs): 105.8 lbs Total Body Water (lbs): 69.2 lbs Visceral Fat Rating : 12   Other Clinical Data Fasting: no Labs: no Today's Visit #: 7 Starting Date: 01/01/24    OBESITY Meghan Diaz is here to discuss her progress with her obesity treatment plan along with follow-up of her obesity related diagnoses.    Nutrition Plan: Other Mediteranean - 85-90% adherence.  Current exercise: walking and weightlifting  Interim History:  She is down 3 lbs since her last visit. She has cleaned out her refrigerator and is trying to eat well. She is eating more protein.  Eating all of the food on the plan., Protein intake is as prescribed, Is not skipping meals, and Water intake is adequate.  Hunger is moderately controlled.  Cravings are moderately controlled. She is doing better with cravings. She throw out some of the her unhealthy snacks.  Assessment/Plan:   Pure hypercholesterolemia:   She is taking Crestor. She denies side effects.   Plan:   Continue medications  Continue her plan and exercise.  She will throw out any unhealthy snacks.    Generalized Obesity: Current BMI BMI (Calculated): 29.87   Pharmacotherapy Plan No anti-obesity medications.   Meghan Diaz is currently in the action stage of change. As such, her goal is to continue with weight  loss efforts.  She has agreed to Other Mediterranean .  Exercise goals: Older adults should do exercises that maintain or improve balance if they are at risk of falling.  She  is walking on the treadmill and doing her weights.  She is walking at a good speed.    Behavioral modification strategies: increasing lean protein intake, decreasing simple carbohydrates , no meal skipping, increase water intake, better snacking choices, planning for success, increasing vegetables, and avoiding temptations.  Meghan Diaz has agreed to follow-up with our clinic in 4 weeks.    Objective:   VITALS: Per patient if applicable, see vitals. GENERAL: Alert and in no acute distress. CARDIOPULMONARY: No increased WOB. Speaking in clear sentences.  PSYCH: Pleasant and cooperative. Speech normal rate and rhythm. Affect is appropriate. Insight and judgement are appropriate. Attention is focused, linear, and appropriate.  NEURO: Oriented as arrived to appointment on time with no prompting.   Attestation Statements:   This was prepared with the assistance of Dragon  Medical Dictation.  Occasional wrong-word or sound-a-like substitutions may have occurred due to the inherent limitations of voice recognition   Clayborne Daring, DO

## 2024-04-21 ENCOUNTER — Ambulatory Visit: Admitting: Bariatrics

## 2024-04-27 ENCOUNTER — Ambulatory Visit: Admitting: Bariatrics

## 2024-04-27 ENCOUNTER — Encounter: Payer: Self-pay | Admitting: Bariatrics

## 2024-04-27 VITALS — BP 150/76 | HR 52 | Temp 97.6°F | Ht 66.0 in | Wt 186.0 lb

## 2024-04-27 DIAGNOSIS — E669 Obesity, unspecified: Secondary | ICD-10-CM

## 2024-04-27 DIAGNOSIS — Z6830 Body mass index (BMI) 30.0-30.9, adult: Secondary | ICD-10-CM | POA: Diagnosis not present

## 2024-04-27 DIAGNOSIS — E785 Hyperlipidemia, unspecified: Secondary | ICD-10-CM | POA: Diagnosis not present

## 2024-04-27 NOTE — Progress Notes (Signed)
 WEIGHT SUMMARY AND BIOMETRICS  Weight Lost Since Last Visit: 0lb  Weight Gained Since Last Visit: 1lb   Vitals Temp: 97.6 F (36.4 C) BP: (!) 150/76 Pulse Rate: (!) 52 SpO2: 99 %   Anthropometric Measurements Height: 5' 6 (1.676 m) Weight: 186 lb (84.4 kg) BMI (Calculated): 30.04 Weight at Last Visit: 185lb Weight Lost Since Last Visit: 0lb Weight Gained Since Last Visit: 1lb Starting Weight: 187lb Total Weight Loss (lbs): 1 lb (0.454 kg)   Body Composition  Body Fat %: 41.5 % Fat Mass (lbs): 77.6 lbs Muscle Mass (lbs): 103.6 lbs Total Body Water (lbs): 70.8 lbs Visceral Fat Rating : 13   Other Clinical Data Fasting: Yes Labs: no Today's Visit #: 8 Starting Date: 01/01/24    OBESITY Willer is here to discuss her progress with her obesity treatment plan along with follow-up of her obesity related diagnoses.    Nutrition Plan: practicing portion control and making smarter food choices, such as increasing vegetables and decreasing simple carbohydrates - 75% adherence.  Current exercise: walking 5 days a week for 40 mins each day  Interim History:  She is up 1 lb since her last visit. She has had more salt over the last 2 weeks.  Eating all of the food on the plan., Protein intake is as prescribed, Not journaling consistently., and Water intake is inadequate.   Pharmacotherapy: Kmari is on not on any anti-obesity medications.  Hunger is moderately controlled.  Cravings are moderately controlled.  Assessment/Plan:   Hyperlipidemia LDL is not at goal. Medication(s): Crestor  Cardiovascular risk factors: advanced age (older than 61 for men, 84 for women), dyslipidemia, hypertension, obesity (BMI >= 30 kg/m2), and sedentary lifestyle  Lab Results  Component Value Date   CHOL 229 (H) 01/01/2024   HDL 79 01/01/2024   LDLCALC 131 (H)  01/01/2024   TRIG 109 01/01/2024   CHOLHDL 2.9 01/01/2024   Lab Results  Component Value Date   ALT 12 01/01/2024   AST 18 01/01/2024   ALKPHOS 101 01/01/2024   BILITOT 0.2 01/01/2024   The ASCVD Risk score (Arnett DK, et al., 2019) failed to calculate for the following reasons:   The 2019 ASCVD risk score is only valid for ages 53 to 57   Risk score cannot be calculated because patient has a medical history suggesting prior/existing ASCVD  Plan:  Continue statin.  She will journal more frequently.  Will avoid all trans fats.  Will read labels Will minimize saturated fats except the following: low fat meats in moderation, diary, and limited dark chocolate.  Will get back on her salt substitute.  Will increase her water.  Will continue her journaling.  Will cut back on the popcorn.     Generalized Obesity: Current BMI BMI (Calculated): 30.04   Pharmacotherapy Plan No medications.   Deandria is currently in the action stage  of change. As such, her goal is to continue with weight loss efforts.  She has agreed to Other Mediterranean .  Exercise goals: Older adults should follow the adult guidelines. When older adults cannot meet the adult guidelines, they should be as physically active as their abilities and conditions will allow.   Behavioral modification strategies: increasing lean protein intake, decreasing simple carbohydrates , no meal skipping, decrease eating out, planning for success, increasing vegetables, get rid of junk food in the home, decrease snacking , avoiding temptations, keep healthy foods in the home, and mindful eating.  Chavon has agreed to follow-up with our clinic in 2 weeks.     Objective:   VITALS: Per patient if applicable, see vitals. GENERAL: Alert and in no acute distress. CARDIOPULMONARY: No increased WOB. Speaking in clear sentences.  PSYCH: Pleasant and cooperative. Speech normal rate and rhythm. Affect is appropriate. Insight and judgement  are appropriate. Attention is focused, linear, and appropriate.  NEURO: Oriented as arrived to appointment on time with no prompting.   Attestation Statements:   This was prepared with the assistance of Engineer, civil (consulting).  Occasional wrong-word or sound-a-like substitutions may have occurred due to the inherent limitations of voice recognition   Clayborne Daring, DO

## 2024-05-13 ENCOUNTER — Ambulatory Visit: Admitting: Bariatrics

## 2024-05-20 ENCOUNTER — Ambulatory Visit (INDEPENDENT_AMBULATORY_CARE_PROVIDER_SITE_OTHER): Admitting: Bariatrics

## 2024-05-20 ENCOUNTER — Encounter: Payer: Self-pay | Admitting: Bariatrics

## 2024-05-20 VITALS — BP 125/69 | HR 56 | Ht 66.0 in | Wt 188.0 lb

## 2024-05-20 DIAGNOSIS — E6609 Other obesity due to excess calories: Secondary | ICD-10-CM

## 2024-05-20 DIAGNOSIS — E7800 Pure hypercholesterolemia, unspecified: Secondary | ICD-10-CM | POA: Diagnosis not present

## 2024-05-20 DIAGNOSIS — Z6830 Body mass index (BMI) 30.0-30.9, adult: Secondary | ICD-10-CM

## 2024-05-20 DIAGNOSIS — E669 Obesity, unspecified: Secondary | ICD-10-CM | POA: Diagnosis not present

## 2024-05-20 NOTE — Progress Notes (Signed)
 WEIGHT SUMMARY AND BIOMETRICS  Weight Lost Since Last Visit: 0  Weight Gained Since Last Visit: 2lb   Vitals BP: 125/69 Pulse Rate: (!) 56 SpO2: 98 %   Anthropometric Measurements Height: 5' 6 (1.676 m) Weight: 188 lb (85.3 kg) BMI (Calculated): 30.36 Weight at Last Visit: 186lb Weight Lost Since Last Visit: 0 Weight Gained Since Last Visit: 2lb Starting Weight: 187lb Total Weight Loss (lbs): 0 lb (0 kg)   Body Composition  Body Fat %: 41.2 % Fat Mass (lbs): 77.8 lbs Muscle Mass (lbs): 105.2 lbs Total Body Water (lbs): 71 lbs Visceral Fat Rating : 13   Other Clinical Data Fasting: no Labs: no Today's Visit #: 9 Starting Date: 01/01/24    OBESITY Lynora is here to discuss her progress with her obesity treatment plan along with follow-up of her obesity related diagnoses.    Nutrition Plan: practicing portion control and making smarter food choices, such as increasing vegetables and decreasing simple carbohydrates - 80% adherence.  Current exercise: walking  Interim History:  She is up 2 lbs since her last visit. It has been difficult to keep on track since her last visit. She went back to her salt substitute.  She has been out to eat more since her last visit.  Eating all of the food on the plan., Protein intake is as prescribed, Not journaling consistently., and Water intake is inadequate.   Pharmacotherapy: Florette is not on any anti-obesity medications.  Hunger is moderately controlled.  Cravings are moderately controlled.  Assessment/Plan:   Pure hypercholesterolemia:  LDL is not at goal. Medication(s): Crestor  Cardiovascular risk factors: advanced age (older than 34 for men, 53 for women), obesity (BMI >= 30 kg/m2), and sedentary lifestyle  Lab Results  Component Value Date   CHOL 229 (H) 01/01/2024   HDL 79 01/01/2024   LDLCALC  131 (H) 01/01/2024   TRIG 109 01/01/2024   CHOLHDL 2.9 01/01/2024   Lab Results  Component Value Date   ALT 12 01/01/2024   AST 18 01/01/2024   ALKPHOS 101 01/01/2024   BILITOT 0.2 01/01/2024   The ASCVD Risk score (Arnett DK, et al., 2019) failed to calculate for the following reasons:   The 2019 ASCVD risk score is only valid for ages 84 to 45   Risk score cannot be calculated because patient has a medical history suggesting prior/existing ASCVD  Plan:  Continue statin.  Will eat out less.  Will plan her meals at home.  Will avoid all trans fats.  Will read labels Will minimize saturated fats except the following: low fat meats in moderation, diary, and limited dark chocolate.     Generalized Obesity: Current BMI BMI (Calculated): 30.36   Pharmacotherapy Plan No anti-obesity medications.   Nycole is not currently in the action stage of change. As such, her goal is to continue with weight loss  efforts.  She has agreed to practicing portion control and making smarter food choices, such as increasing vegetables and decreasing simple carbohydrates.  Exercise goals: Older adults should determine their level of effort for physical activity relative to their level of fitness.   Behavioral modification strategies: increasing lean protein intake, decreasing simple carbohydrates , no meal skipping, increase water intake, better snacking choices, planning for success, increasing vegetables, increasing lower sugar fruits, avoiding temptations, keep healthy foods in the home, and increase frequency of journaling.  Jazel has agreed to follow-up with our clinic in 2 weeks.     Objective:   VITALS: Per patient if applicable, see vitals. GENERAL: Alert and in no acute distress. CARDIOPULMONARY: No increased WOB. Speaking in clear sentences.  PSYCH: Pleasant and cooperative. Speech normal rate and rhythm. Affect is appropriate. Insight and judgement are appropriate. Attention is focused,  linear, and appropriate.  NEURO: Oriented as arrived to appointment on time with no prompting.   Attestation Statements:   This was prepared with the assistance of Engineer, Civil (consulting).  Occasional wrong-word or sound-a-like substitutions may have occurred due to the inherent limitations of voice recognition   Clayborne Daring, DO

## 2024-06-11 ENCOUNTER — Ambulatory Visit: Admitting: Bariatrics
